# Patient Record
Sex: Female | Born: 1980
Health system: Southern US, Community
[De-identification: ages and names within clinical notes are randomized; demographics above are authoritative.]

## PROBLEM LIST (undated history)

## (undated) ENCOUNTER — Inpatient Hospital Stay (HOSPITAL_COMMUNITY): Payer: Self-pay

## (undated) DIAGNOSIS — Z972 Presence of dental prosthetic device (complete) (partial): Secondary | ICD-10-CM

## (undated) DIAGNOSIS — R7303 Prediabetes: Secondary | ICD-10-CM

## (undated) DIAGNOSIS — G589 Mononeuropathy, unspecified: Secondary | ICD-10-CM

## (undated) DIAGNOSIS — J020 Streptococcal pharyngitis: Secondary | ICD-10-CM

## (undated) DIAGNOSIS — J189 Pneumonia, unspecified organism: Secondary | ICD-10-CM

## (undated) DIAGNOSIS — I10 Essential (primary) hypertension: Secondary | ICD-10-CM

## (undated) HISTORY — PX: NO PAST SURGERIES: SHX2092

## (undated) HISTORY — PX: FRACTURE SURGERY: SHX138

## (undated) HISTORY — PX: WISDOM TOOTH EXTRACTION: SHX21

---
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
## 2003-02-17 ENCOUNTER — Emergency Department (HOSPITAL_COMMUNITY): Admission: EM | Admit: 2003-02-17 | Discharge: 2003-02-17 | Payer: Self-pay | Admitting: Emergency Medicine

## 2004-03-06 ENCOUNTER — Inpatient Hospital Stay (HOSPITAL_COMMUNITY): Admission: AD | Admit: 2004-03-06 | Discharge: 2004-03-06 | Payer: Self-pay | Admitting: *Deleted

## 2004-03-14 ENCOUNTER — Emergency Department (HOSPITAL_COMMUNITY): Admission: EM | Admit: 2004-03-14 | Discharge: 2004-03-14 | Payer: Self-pay | Admitting: Emergency Medicine

## 2004-05-22 ENCOUNTER — Inpatient Hospital Stay (HOSPITAL_COMMUNITY): Admission: AD | Admit: 2004-05-22 | Discharge: 2004-05-22 | Payer: Self-pay | Admitting: *Deleted

## 2004-09-17 ENCOUNTER — Inpatient Hospital Stay (HOSPITAL_COMMUNITY): Admission: AD | Admit: 2004-09-17 | Discharge: 2004-09-19 | Payer: Self-pay | Admitting: Obstetrics

## 2005-05-11 ENCOUNTER — Emergency Department (HOSPITAL_COMMUNITY): Admission: EM | Admit: 2005-05-11 | Discharge: 2005-05-11 | Payer: Self-pay | Admitting: *Deleted

## 2007-07-07 ENCOUNTER — Inpatient Hospital Stay (HOSPITAL_COMMUNITY): Admission: AD | Admit: 2007-07-07 | Discharge: 2007-07-07 | Payer: Self-pay | Admitting: Obstetrics and Gynecology

## 2008-02-28 ENCOUNTER — Emergency Department (HOSPITAL_COMMUNITY): Admission: EM | Admit: 2008-02-28 | Discharge: 2008-02-28 | Payer: Self-pay | Admitting: Family Medicine

## 2008-04-22 ENCOUNTER — Emergency Department (HOSPITAL_COMMUNITY): Admission: EM | Admit: 2008-04-22 | Discharge: 2008-04-22 | Payer: Self-pay | Admitting: Emergency Medicine

## 2008-08-31 ENCOUNTER — Emergency Department (HOSPITAL_COMMUNITY): Admission: EM | Admit: 2008-08-31 | Discharge: 2008-08-31 | Payer: Self-pay | Admitting: Emergency Medicine

## 2008-09-02 ENCOUNTER — Emergency Department (HOSPITAL_COMMUNITY): Admission: EM | Admit: 2008-09-02 | Discharge: 2008-09-02 | Payer: Self-pay | Admitting: Family Medicine

## 2008-10-10 ENCOUNTER — Emergency Department (HOSPITAL_COMMUNITY): Admission: EM | Admit: 2008-10-10 | Discharge: 2008-10-10 | Payer: Self-pay | Admitting: Emergency Medicine

## 2008-10-17 ENCOUNTER — Emergency Department (HOSPITAL_COMMUNITY): Admission: EM | Admit: 2008-10-17 | Discharge: 2008-10-17 | Payer: Self-pay | Admitting: Emergency Medicine

## 2009-06-04 ENCOUNTER — Emergency Department (HOSPITAL_COMMUNITY): Admission: EM | Admit: 2009-06-04 | Discharge: 2009-06-04 | Payer: Self-pay | Admitting: Family Medicine

## 2009-11-14 ENCOUNTER — Emergency Department (HOSPITAL_COMMUNITY): Admission: EM | Admit: 2009-11-14 | Discharge: 2009-11-14 | Payer: Self-pay | Admitting: Emergency Medicine

## 2011-03-05 LAB — DIFFERENTIAL
Basophils Absolute: 0 10*3/uL (ref 0.0–0.1)
Basophils Relative: 0 % (ref 0–1)
Eosinophils Absolute: 0.3 10*3/uL (ref 0.0–0.7)
Eosinophils Relative: 4 % (ref 0–5)
Lymphocytes Relative: 43 % (ref 12–46)
Lymphs Abs: 2.9 10*3/uL (ref 0.7–4.0)
Monocytes Absolute: 0.8 10*3/uL (ref 0.1–1.0)
Monocytes Relative: 11 % (ref 3–12)
Neutro Abs: 2.9 10*3/uL (ref 1.7–7.7)
Neutrophils Relative %: 42 % — ABNORMAL LOW (ref 43–77)

## 2011-03-05 LAB — WET PREP, GENITAL
Clue Cells Wet Prep HPF POC: NONE SEEN
Trich, Wet Prep: NONE SEEN
Yeast Wet Prep HPF POC: NONE SEEN

## 2011-03-05 LAB — COMPREHENSIVE METABOLIC PANEL
ALT: 16 U/L (ref 0–35)
AST: 11 U/L (ref 0–37)
Albumin: 3.9 g/dL (ref 3.5–5.2)
Alkaline Phosphatase: 59 U/L (ref 39–117)
BUN: 9 mg/dL (ref 6–23)
CO2: 26 mEq/L (ref 19–32)
Calcium: 9 mg/dL (ref 8.4–10.5)
Chloride: 106 mEq/L (ref 96–112)
Creatinine, Ser: 0.67 mg/dL (ref 0.4–1.2)
GFR calc Af Amer: >60 mL/min (ref >60)
GFR calc non Af Amer: >60 mL/min (ref >60)
Glucose, Bld: 77 mg/dL (ref 70–99)
Potassium: 3.9 mEq/L (ref 3.5–5.1)
Sodium: 138 mEq/L (ref 135–145)
Total Bilirubin: 0.6 mg/dL (ref 0.3–1.2)
Total Protein: 7.6 g/dL (ref 6.0–8.3)

## 2011-03-05 LAB — CBC
HCT: 40.2 % (ref 36.0–46.0)
Hemoglobin: 13.1 g/dL (ref 12.0–15.0)
MCHC: 32.7 g/dL (ref 30.0–36.0)
MCV: 92.6 fL (ref 78.0–100.0)
RBC: 4.34 MIL/uL (ref 3.87–5.11)
RDW: 14.1 % (ref 11.5–15.5)
WBC: 6.9 10*3/uL (ref 4.0–10.5)

## 2011-03-05 LAB — POCT PREGNANCY, URINE: Preg Test, Ur: NEGATIVE

## 2011-03-05 LAB — URINALYSIS, ROUTINE W REFLEX MICROSCOPIC
Bilirubin Urine: NEGATIVE
Glucose, UA: NEGATIVE mg/dL
Ketones, ur: NEGATIVE mg/dL
Leukocytes, UA: NEGATIVE
Nitrite: NEGATIVE
Protein, ur: NEGATIVE mg/dL
Specific Gravity, Urine: 1.03 (ref 1.005–1.030)
Urobilinogen, UA: 1 mg/dL (ref 0.0–1.0)
pH: 7 (ref 5.0–8.0)

## 2011-03-05 LAB — URINE MICROSCOPIC-ADD ON

## 2011-03-05 LAB — GC/CHLAMYDIA PROBE AMP, GENITAL
Chlamydia, DNA Probe: NEGATIVE
GC Probe Amp, Genital: NEGATIVE

## 2011-03-10 LAB — CULTURE, ROUTINE-ABSCESS

## 2011-09-03 LAB — URINE MICROSCOPIC-ADD ON

## 2011-09-03 LAB — DIFFERENTIAL
Basophils Absolute: 0
Basophils Absolute: 0
Basophils Relative: 0
Basophils Relative: 1
Eosinophils Absolute: 0.3
Eosinophils Absolute: 0.3
Eosinophils Relative: 3
Eosinophils Relative: 6 — ABNORMAL HIGH
Lymphocytes Relative: 17
Lymphocytes Relative: 30
Lymphs Abs: 1.9
Lymphs Abs: 1.8
Monocytes Absolute: 1.5 — ABNORMAL HIGH
Monocytes Absolute: 0.7
Monocytes Relative: 13 — ABNORMAL HIGH
Monocytes Relative: 11
Neutro Abs: 7.7
Neutro Abs: 3.2
Neutrophils Relative %: 67
Neutrophils Relative %: 53

## 2011-09-03 LAB — URINE CULTURE

## 2011-09-03 LAB — LIPASE, BLOOD: Lipase: 15

## 2011-09-03 LAB — COMPREHENSIVE METABOLIC PANEL
ALT: 10
AST: 15
Albumin: 3.6
Alkaline Phosphatase: 51
BUN: 10
CO2: 25
Calcium: 9.1
Chloride: 107
Creatinine, Ser: 0.79
GFR calc Af Amer: >60
GFR calc non Af Amer: >60
Glucose, Bld: 79
Potassium: 4
Sodium: 140
Total Bilirubin: 1.1
Total Protein: 7.4

## 2011-09-03 LAB — URINALYSIS, ROUTINE W REFLEX MICROSCOPIC
Glucose, UA: NEGATIVE
Glucose, UA: NEGATIVE
Ketones, ur: 40 — AB
Ketones, ur: >80 — AB
Leukocytes, UA: NEGATIVE
Nitrite: NEGATIVE
Nitrite: NEGATIVE
Protein, ur: NEGATIVE
Protein, ur: 30 — AB
Specific Gravity, Urine: 1.024
Specific Gravity, Urine: 1.04 — ABNORMAL HIGH
Urobilinogen, UA: 1
Urobilinogen, UA: 1
pH: 6
pH: 6

## 2011-09-03 LAB — CBC
HCT: 44.6
HCT: 42.8
Hemoglobin: 14.5
Hemoglobin: 14.1
MCHC: 32.4
MCHC: 33
MCV: 93.8
MCV: 92.3
Platelets: 252
Platelets: 358
RBC: 4.75
RBC: 4.64
RDW: 13.6
RDW: 14.1
WBC: 11.5 — ABNORMAL HIGH
WBC: 6

## 2011-09-03 LAB — POCT I-STAT, CHEM 8
BUN: 6
Calcium, Ion: 1.13
Chloride: 102
Creatinine, Ser: 1
Glucose, Bld: 88
HCT: 46
Hemoglobin: 15.6 — ABNORMAL HIGH
Potassium: 3.7
Sodium: 139
TCO2: 27

## 2011-09-03 LAB — POCT PREGNANCY, URINE
Preg Test, Ur: NEGATIVE
Preg Test, Ur: NEGATIVE

## 2011-09-16 LAB — WET PREP, GENITAL
Clue Cells Wet Prep HPF POC: NONE SEEN
Trich, Wet Prep: NONE SEEN
Yeast Wet Prep HPF POC: NONE SEEN

## 2011-09-16 LAB — URINE MICROSCOPIC-ADD ON

## 2011-09-16 LAB — URINALYSIS, ROUTINE W REFLEX MICROSCOPIC
Bilirubin Urine: NEGATIVE
Glucose, UA: NEGATIVE
Ketones, ur: 15 — AB
Nitrite: NEGATIVE
Protein, ur: NEGATIVE
Specific Gravity, Urine: >1.03 — ABNORMAL HIGH
Urobilinogen, UA: 0.2
pH: 6

## 2011-09-16 LAB — HERPES SIMPLEX VIRUS CULTURE: Culture: DETECTED

## 2011-09-16 LAB — GC/CHLAMYDIA PROBE AMP, GENITAL
Chlamydia, DNA Probe: NEGATIVE
GC Probe Amp, Genital: NEGATIVE

## 2011-09-16 LAB — POCT PREGNANCY, URINE
Operator id: 114931
Preg Test, Ur: NEGATIVE

## 2011-10-01 ENCOUNTER — Emergency Department (HOSPITAL_COMMUNITY)
Admission: EM | Admit: 2011-10-01 | Discharge: 2011-10-01 | Disposition: A | Discharge status: Home / Self Care | Payer: Self-pay | Attending: Emergency Medicine | Admitting: Emergency Medicine

## 2011-10-01 DIAGNOSIS — M25559 Pain in unspecified hip: Secondary | ICD-10-CM | POA: Insufficient documentation

## 2011-10-01 DIAGNOSIS — N390 Urinary tract infection, site not specified: Secondary | ICD-10-CM | POA: Insufficient documentation

## 2011-10-01 DIAGNOSIS — M5430 Sciatica, unspecified side: Secondary | ICD-10-CM | POA: Insufficient documentation

## 2011-10-01 LAB — URINE MICROSCOPIC-ADD ON

## 2011-10-01 LAB — POCT PREGNANCY, URINE: Preg Test, Ur: NEGATIVE

## 2011-10-01 LAB — URINALYSIS, ROUTINE W REFLEX MICROSCOPIC
Bilirubin Urine: NEGATIVE
Glucose, UA: NEGATIVE mg/dL
Ketones, ur: 15 mg/dL — AB
Leukocytes, UA: NEGATIVE
Nitrite: NEGATIVE
Protein, ur: 30 mg/dL — AB
Specific Gravity, Urine: 1.039 — ABNORMAL HIGH (ref 1.005–1.030)
Urobilinogen, UA: 1 mg/dL (ref 0.0–1.0)
pH: 6 (ref 5.0–8.0)

## 2011-10-07 ENCOUNTER — Emergency Department (HOSPITAL_COMMUNITY)
Admission: EM | Admit: 2011-10-07 | Discharge: 2011-10-07 | Disposition: A | Discharge status: Home / Self Care | Payer: Self-pay | Attending: Emergency Medicine | Admitting: Emergency Medicine

## 2011-10-07 DIAGNOSIS — K0889 Other specified disorders of teeth and supporting structures: Secondary | ICD-10-CM

## 2011-10-07 DIAGNOSIS — K089 Disorder of teeth and supporting structures, unspecified: Secondary | ICD-10-CM | POA: Insufficient documentation

## 2011-10-07 MED ORDER — HYDROCODONE-ACETAMINOPHEN 5-500 MG PO TABS: 2.0000 | ORAL_TABLET | Freq: Four times a day (QID) | ORAL | Status: DC | PRN

## 2011-10-07 MED ORDER — PENICILLIN V POTASSIUM 500 MG PO TABS: 500.0000 mg | ORAL_TABLET | Freq: Three times a day (TID) | ORAL | Status: DC

## 2011-10-07 NOTE — ED Notes (Signed)
Pt complains of a toothache from earlier today, pain got worse after laying down

## 2011-10-07 NOTE — ED Provider Notes (Signed)
History    patient complaining of left upper molar dental pain for the past one day.  Pain is described as throbbing, worsening with hot and cold liquid, and significant enough that she couldn't sleep.  She denies headache, ear pain, sore throat, neck pain, jaw pain, chest pain or shortness of breath. She denies any recent trauma. She has had similar pain to the same tooth in the past which lasted for 2 days. She has not been seen by a dentist for dental pain.  CSN: [id number] Arrival date & time: 10/07/2011  2:21 AM   First MD Initiated Contact with Patient 10/07/11 [phone]      No chief complaint on file.   (Consider location/radiation/quality/duration/timing/severity/associated sxs/prior treatment) Patient is a 30 y.o. female presenting with tooth pain. The history is provided by the patient. No language interpreter was used.  Dental PainThe primary symptoms include mouth pain. The symptoms began 6 to 12 hours ago. The symptoms are worsening. The symptoms occur frequently.  Additional symptoms include: dental sensitivity to temperature.    History reviewed. No pertinent past medical history.  History reviewed. No pertinent past surgical history.  History reviewed. No pertinent family history.  History  Substance Use Topics  . Smoking status: Not on file  . Smokeless tobacco: Not on file  . Alcohol Use: Not on file    OB History    Grav Para Term Preterm Abortions TAB SAB Ect Mult Living                  Review of Systems  All other systems reviewed and are negative.    Allergies  Review of patient's allergies indicates no known allergies.  Home Medications  No current outpatient prescriptions on file.  BP 126/75  Pulse 61  Temp(Src) 98.7 F (37.1 C) (Oral)  Resp 18  SpO2 100%  Physical Exam  Nursing note and vitals reviewed. Constitutional:       Awake, alert, nontoxic appearance  HENT:  Head: Atraumatic.       L upper molar with significant decay.  No  obvious abscess.  Tenderness on percussion.  No maloclusion or TMJ pain.  Normal Ear examinations.    Eyes: Right eye exhibits no discharge. Left eye exhibits no discharge.  Neck: Neck supple.  Pulmonary/Chest: Effort normal. She exhibits no tenderness.  Abdominal: There is no tenderness. There is no rebound.  Musculoskeletal: She exhibits no tenderness.       Baseline ROM, no obvious new focal weakness  Neurological:       Mental status and motor strength appears baseline for patient and situation  Skin: No rash noted.  Psychiatric: She has a normal mood and affect.    ED Course  Procedures (including critical care time)  Labs Reviewed - No data to display No results found.   No diagnosis found.    MDM  Dental pain, secondary to dental decay.  Pain medication and antibiotic prescription given. Will give referral to dentist.        Fayrene Helper, PA Resident 10/07/11 [phone]

## 2011-10-07 NOTE — ED Provider Notes (Signed)
Medical screening examination/treatment/procedure(s) were performed by non-physician practitioner and as supervising physician I was immediately available for consultation/collaboration.   Vida Roller, MD 10/07/11 2017

## 2011-10-09 ENCOUNTER — Emergency Department (HOSPITAL_COMMUNITY)
Admission: EM | Admit: 2011-10-09 | Discharge: 2011-10-09 | Disposition: A | Discharge status: Home / Self Care | Payer: Self-pay | Attending: Emergency Medicine | Admitting: Emergency Medicine

## 2011-10-09 ENCOUNTER — Encounter (HOSPITAL_COMMUNITY): Payer: Self-pay | Admitting: *Deleted

## 2011-10-09 DIAGNOSIS — M549 Dorsalgia, unspecified: Secondary | ICD-10-CM | POA: Insufficient documentation

## 2011-10-09 DIAGNOSIS — M5432 Sciatica, left side: Secondary | ICD-10-CM

## 2011-10-09 DIAGNOSIS — R209 Unspecified disturbances of skin sensation: Secondary | ICD-10-CM | POA: Insufficient documentation

## 2011-10-09 DIAGNOSIS — M5430 Sciatica, unspecified side: Secondary | ICD-10-CM | POA: Insufficient documentation

## 2011-10-09 MED ORDER — METHOCARBAMOL 500 MG PO TABS: 750.0000 mg | ORAL_TABLET | Freq: Two times a day (BID) | ORAL | Status: DC

## 2011-10-09 MED ORDER — PREDNISONE (PAK) 10 MG PO TABS: ORAL_TABLET | ORAL | Status: DC

## 2011-10-09 MED ORDER — KETOROLAC TROMETHAMINE 60 MG/2ML IM SOLN
60.0000 mg | Freq: Once | INTRAMUSCULAR | Status: AC
Start: 2011-10-09 — End: 2011-10-09
  Administered 2011-10-09: 60 mg via INTRAMUSCULAR
  Filled 2011-10-09: qty 2

## 2011-10-09 MED ORDER — HYDROCODONE-ACETAMINOPHEN 5-325 MG PO TABS: 1.0000 | ORAL_TABLET | Freq: Four times a day (QID) | ORAL | Status: DC | PRN

## 2011-10-09 NOTE — ED Provider Notes (Signed)
History     CSN: [id number] Arrival date & time: 10/09/2011  3:21 PM  Patient is a 30 y.o. female presenting with back pain.  Back Pain  This is a new problem. The current episode started more than 1 week ago. The problem occurs constantly. The problem has been gradually worsening. The pain is associated with lifting heavy objects. The pain is present in the lumbar spine. The quality of the pain is described as stabbing and shooting. The pain radiates to the left thigh and left knee. The pain is severe. The symptoms are aggravated by bending, twisting and certain positions. The pain is the same all the time. Associated symptoms include leg pain. Pertinent negatives include no chest pain, no fever, no numbness, no headaches, no abdominal pain, no bowel incontinence, no perianal numbness, no bladder incontinence, no dysuria, no pelvic pain, no paresthesias, no paresis, no tingling and no weakness. She has tried NSAIDs and bed rest for the symptoms. The treatment provided no relief.   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 States she carries a heavy tray constantly and is unsure if this aggravated her back and hip. Reports pain is constant but worse with laying flat and certain positions. Reports she was here at the end of October for same complaints. States pain has been constant and persistent States at that time had a urinalysis completed is negative for urinary tract infection.   History reviewed. No pertinent past medical history.  History reviewed. No pertinent past surgical history.  No family history on file.  History  Substance Use Topics  . Smoking status: Never Smoker   . Smokeless tobacco: Not on file  . Alcohol Use: No    OB History    Grav Para Term Preterm Abortions TAB SAB Ect Mult Living                  Review of Systems  Constitutional: Negative for fever and chills.  HENT: Negative for neck pain and neck stiffness.   Cardiovascular: Negative for chest pain.    Gastrointestinal: Negative for abdominal pain and bowel incontinence.  Genitourinary: Negative for bladder incontinence, dysuria, urgency, frequency, hematuria, flank pain and pelvic pain.  Musculoskeletal: Positive for back pain.       Denies saddle anesthesias, or perineal numbness, urinary or bowel incontinence  Neurological: Negative for tingling, weakness, numbness, headaches and paresthesias.    Allergies  Review of patient's allergies indicates no known allergies.  Home Medications   Current Outpatient Rx  Name Route Sig Dispense Refill  . PENICILLIN V POTASSIUM 500 MG PO TABS Oral Take 500 mg by mouth 3 (three) times daily.        BP 144/86  Pulse 89  Temp(Src) 98.1 F (36.7 C) (Oral)  Resp 16  Wt 157 lb 13.6 oz (71.6 kg)  SpO2 100%  LMP 09/13/2011  Physical Exam  Vitals reviewed. Constitutional: She is oriented to person, place, and time. Vital signs are normal. She appears well-developed and well-nourished.  HENT:  Head: Normocephalic and atraumatic.  Eyes: Conjunctivae are normal. Pupils are equal, round, and reactive to light.  Neck: Normal range of motion. Neck supple.  Cardiovascular: Normal rate, regular rhythm and normal heart sounds.  Exam reveals no friction rub.   No murmur heard. Pulmonary/Chest: Effort normal and breath sounds normal. She has no wheezes. She has no rhonchi. She has no rales. She exhibits no tenderness.  Musculoskeletal: Normal range of motion.       Lumbar back:  She exhibits tenderness, pain and spasm. She exhibits normal range of motion, no bony tenderness, no swelling, no edema and no deformity.       Back:       Reproduction of pain with percussion over left lumbar paraspinal region . Positive SLR. NML sensation of BLE. No perineal numbness, full ROM of low back and bilateral hips. NML strength. NML gait  Neurological: She is alert and oriented to person, place, and time. Coordination normal.  Skin: Skin is warm and dry. No rash  noted. No erythema. No pallor.    ED Course  Procedures (including critical care time)  Labs Reviewed - No data to display No results found.   No diagnosis found.    MDM         Thomasene Lot, PA 10/09/11 1756  Thomasene Lot, PA 10/09/11 1757

## 2011-10-09 NOTE — ED Notes (Signed)
Pt states "I was here x 2 wks ago for pain in my left hip, it went away but now it worse than before"

## 2011-10-09 NOTE — ED Notes (Signed)
Cont. To have pain

## 2011-10-10 ENCOUNTER — Encounter (HOSPITAL_COMMUNITY): Payer: Self-pay | Admitting: Emergency Medicine

## 2011-10-10 ENCOUNTER — Emergency Department (HOSPITAL_COMMUNITY)
Admission: EM | Admit: 2011-10-10 | Discharge: 2011-10-10 | Disposition: A | Discharge status: Home / Self Care | Payer: Self-pay | Attending: Emergency Medicine | Admitting: Emergency Medicine

## 2011-10-10 ENCOUNTER — Emergency Department (HOSPITAL_COMMUNITY): Payer: Self-pay

## 2011-10-10 DIAGNOSIS — IMO0001 Reserved for inherently not codable concepts without codable children: Secondary | ICD-10-CM | POA: Insufficient documentation

## 2011-10-10 DIAGNOSIS — Z79899 Other long term (current) drug therapy: Secondary | ICD-10-CM | POA: Insufficient documentation

## 2011-10-10 DIAGNOSIS — M25552 Pain in left hip: Secondary | ICD-10-CM

## 2011-10-10 DIAGNOSIS — M545 Low back pain, unspecified: Secondary | ICD-10-CM | POA: Insufficient documentation

## 2011-10-10 DIAGNOSIS — M25559 Pain in unspecified hip: Secondary | ICD-10-CM | POA: Insufficient documentation

## 2011-10-10 MED ORDER — OXYCODONE-ACETAMINOPHEN 5-325 MG PO TABS
1.0000 | ORAL_TABLET | Freq: Once | ORAL | Status: AC
Start: 2011-10-10 — End: 2011-10-10
  Administered 2011-10-10: 1 via ORAL
  Filled 2011-10-10: qty 1

## 2011-10-10 MED ORDER — HYDROMORPHONE HCL PF 1 MG/ML IJ SOLN
1.0000 mg | Freq: Once | INTRAMUSCULAR | Status: AC
  Administered 2011-10-10: 1 mg via INTRAMUSCULAR
  Filled 2011-10-10: qty 1

## 2011-10-10 MED ORDER — OXYCODONE-ACETAMINOPHEN 5-325 MG PO TABS: 1.0000 | ORAL_TABLET | ORAL | Status: DC | PRN

## 2011-10-10 NOTE — Discharge Instructions (Signed)
Continue prednisone as prescribed.  Take percocet as needed for severe pain.  Do not drive within four hours of taking this medication (may cause drowsiness or confusion).  Activity as tolerated.  Follow up with the orthopedic physician.   You may return to the ER if symptoms worsen or you have any other concerns.

## 2011-10-10 NOTE — ED Notes (Signed)
leftr hip pain x 2 days   Was seen at [facility] yesterday and dx w/ pinched nerve was given rx got them filled but not helping

## 2011-10-10 NOTE — ED Notes (Signed)
Pt with continued pain. Injection given as ordered. Will reeval prior to d/c

## 2011-10-10 NOTE — ED Provider Notes (Signed)
Medical screening examination/treatment/procedure(s) were performed by non-physician practitioner and as supervising physician I was immediately available for consultation/collaboration.   Laray Anger, DO 10/10/11 [phone]

## 2011-10-10 NOTE — ED Provider Notes (Signed)
History     CSN: [id number] Arrival date & time: 10/10/2011  9:00 AM   First MD Initiated Contact with Patient 10/10/11 0913      Chief Complaint  Patient presents with  . Hip Pain    (Consider location/radiation/quality/duration/timing/severity/associated sxs/prior treatment) HPI History provided by pt.   Pt has had intermittent pain L hip for the past two weeks.  Has recently become more constant and severe.  Denies trauma.  Has mild low back pain.  Denies fever, abd pain bladder/bowel dysfunction and lower extremity weakness/parasthesias.   Ambulatory but bearing weight aggravates pain.  Denies trauma.  Has never had pain like this in the past.  Per prior chart, pt seen for same in ED yesterday.  Diagnosed w/ L sciatica and discharged home w/ vicodin, robaxin and prednisone.  No relief w/ these medications.  Patient's sister crying because she has never seen pt in so much pain and it scares her.  History reviewed. No pertinent past medical history.  No past surgical history on file.  No family history on file.  History  Substance Use Topics  . Smoking status: Never Smoker   . Smokeless tobacco: Not on file  . Alcohol Use: No    OB History    Grav Para Term Preterm Abortions TAB SAB Ect Mult Living                  Review of Systems  All other systems reviewed and are negative.    Allergies  Review of patient's allergies indicates no known allergies.  Home Medications   Current Outpatient Rx  Name Route Sig Dispense Refill  . HYDROCODONE-ACETAMINOPHEN 5-325 MG PO TABS Oral Take 1-2 tablets by mouth every 6 (six) hours as needed for pain. 15 tablet 0  . METHOCARBAMOL 500 MG PO TABS Oral Take 1.5 tablets (750 mg total) by mouth 2 (two) times daily. 20 tablet 0  . PENICILLIN V POTASSIUM 500 MG PO TABS Oral Take 500 mg by mouth 3 (three) times daily.      Marland Kitchen PREDNISONE (PAK) 10 MG PO TABS  Take 6 Tabs PO on day 1. Take 5 tabs PO on day 2. Take 4 tabs PO on day  3. Take 3 tabs PO on day 4. Take 2 tabs PO on day 5. Take 1 tab PO on day 6.  21 tablet 0    BP 124/77  Pulse 83  Temp(Src) 97.7 F (36.5 C) (Oral)  Resp 16  Ht 5\' 6"  (1.676 m)  Wt 165 lb (74.844 kg)  BMI 26.63 kg/m2  SpO2 99%  LMP 09/13/2011  Physical Exam  Nursing note and vitals reviewed. Constitutional: She is oriented to person, place, and time. She appears well-developed and well-nourished.  HENT:  Head: Normocephalic and atraumatic.  Eyes:       Normal appearance  Neck: Normal range of motion.  Cardiovascular: Normal rate and regular rhythm.   Pulmonary/Chest: Effort normal and breath sounds normal.  Musculoskeletal:       Mild, mid-line lumbar spine ttp. Tenderness left buttock and along iliac crest anteriorly and laterally.  No CVA ttp. Full ROM of LE but pain w/ internal rotation of hip.  Nml patellar reflexes.  No saddle anesthesia. Distal sensation intact.  2+ DP pulses.    Neurological: She is alert and oriented to person, place, and time.  Skin: Skin is warm and dry. No rash noted.  Psychiatric: She has a normal mood and affect. Her behavior is normal.  ED Course  Procedures (including critical care time)  Labs Reviewed - No data to display Dg Hip Complete Left  10/10/2011  *RADIOLOGY REPORT*  Clinical Data: Pain in the lateral and posterior left hip.  LEFT HIP - COMPLETE 2+ VIEW  Comparison: None.  Findings: Single view of the pelvis and two views of the left hip were obtained.  The pelvic bony ring is intact.  Normal appearance of the SI joints and sacrum.  High dense material in the pelvis probably related to stool.  Pubic rami are intact.  The left hip is located without acute fracture.  IMPRESSION: No acute bony abnormality to the pelvis or left hip.  Original Report Authenticated By: Richarda Overlie, M.D.     1. Pain in left hip       MDM  Pt presents for second day in a row w/ left hip pain.  Diagnosed w/ sciatica yesterday and prescribed medications  have not given her any relief.  Sister crying because she has never seen pt in so much pain.  On exam, tenderness lumbar spine, left buttock and left iliac crest.  Worst at ant/lat iliac crest.  No red flag s/sx.   Xray pending.  Pt ordered 1 percocet for pain.    Xray neg. Results discussed w/ pt.  She reports that pain has not improved w/ percocet.  Will give her a single dose of IM dilaudid and d/c home. Prescribed short course of percocet and recommended rest and ortho f/u.  She will return if symptoms worsen or she develops fever.       Otilio Miu, [place] 10/10/11 1106

## 2011-10-10 NOTE — ED Provider Notes (Signed)
Medical screening examination/treatment/procedure(s) were performed by non-physician practitioner and as supervising physician I was immediately available for consultation/collaboration.  Aloysius Heinle, MD 10/10/11 1724 

## 2011-10-18 ENCOUNTER — Emergency Department (HOSPITAL_COMMUNITY)
Admission: EM | Admit: 2011-10-18 | Discharge: 2011-10-18 | Disposition: A | Discharge status: Home / Self Care | Payer: Self-pay | Source: Home / Self Care

## 2011-10-18 ENCOUNTER — Encounter (HOSPITAL_COMMUNITY): Payer: Self-pay | Admitting: *Deleted

## 2011-10-18 DIAGNOSIS — N760 Acute vaginitis: Secondary | ICD-10-CM

## 2011-10-18 HISTORY — DX: Mononeuropathy, unspecified: G58.9

## 2011-10-18 LAB — POCT URINALYSIS DIP (DEVICE)
Glucose, UA: NEGATIVE mg/dL
Ketones, ur: 40 mg/dL — AB
Leukocytes, UA: NEGATIVE
Nitrite: NEGATIVE
Protein, ur: 30 mg/dL — AB
Specific Gravity, Urine: 1.02 (ref 1.005–1.030)
Urobilinogen, UA: 2 mg/dL — ABNORMAL HIGH (ref 0.0–1.0)
pH: 7 (ref 5.0–8.0)

## 2011-10-18 LAB — POCT PREGNANCY, URINE: Preg Test, Ur: NEGATIVE

## 2011-10-18 LAB — WET PREP, GENITAL
Trich, Wet Prep: NONE SEEN
Yeast Wet Prep HPF POC: NONE SEEN

## 2011-10-18 MED ORDER — METRONIDAZOLE 500 MG PO TABS: 500.0000 mg | ORAL_TABLET | Freq: Two times a day (BID) | ORAL | Status: AC

## 2011-10-18 NOTE — ED Provider Notes (Signed)
History     CSN: [id number] Arrival date & time: 10/18/2011  7:35 PM   None     Chief Complaint  Patient presents with  . Urinary Tract Infection    (Consider location/radiation/quality/duration/timing/severity/associated sxs/prior treatment) HPI Comments: Pt presents concerned she has a UTI. She states that she has pressure with every void. No burning or pain, or frequency. She also has Lt lower back pain every time she urinates - achey pain. Was recently seen in ED with Lt lower back back but states this is different. Lower abd aching. No vaginal discharge.   Patient is a 30 y.o. female presenting with urinary tract infection. The history is provided by the patient.  Urinary Tract Infection This is a new problem. The current episode started more than 2 days ago. The problem occurs constantly. The problem has not changed since onset.Associated symptoms include abdominal pain. Pertinent negatives include no chest pain, no headaches and no shortness of breath. The symptoms are aggravated by nothing. The symptoms are relieved by nothing. She has tried nothing for the symptoms.    Past Medical History  Diagnosis Date  . Pinched nerve     in back; recently completed course of meds for sxs    History reviewed. No pertinent past surgical history.  History reviewed. No pertinent family history.  History  Substance Use Topics  . Smoking status: Never Smoker   . Smokeless tobacco: Not on file  . Alcohol Use: No    OB History    Grav Para Term Preterm Abortions TAB SAB Ect Mult Living                  Review of Systems  Constitutional: Negative for fever and chills.  Respiratory: Negative for shortness of breath.   Cardiovascular: Negative for chest pain.  Gastrointestinal: Positive for abdominal pain. Negative for nausea, vomiting, diarrhea and constipation.  Genitourinary: Negative for dysuria, urgency, frequency, vaginal bleeding, vaginal discharge, vaginal pain and pelvic  pain.  Musculoskeletal: Positive for back pain.  Neurological: Negative for headaches.    Allergies  Review of patient's allergies indicates no known allergies.  Home Medications  No current outpatient prescriptions on file.  BP 127/82  Pulse 73  Temp(Src) 97.5 F (36.4 C) (Oral)  Resp 16  SpO2 100%  LMP 10/12/2011  Physical Exam  Nursing note and vitals reviewed. Constitutional: She appears well-developed and well-nourished. No distress.  HENT:  Head: Normocephalic and atraumatic.  Cardiovascular: Normal rate and regular rhythm.   Murmur heard. Pulmonary/Chest: Effort normal and breath sounds normal. No respiratory distress.  Abdominal: Soft. Normal appearance and bowel sounds are normal. She exhibits no mass. There is no hepatosplenomegaly. There is tenderness (mild) in the suprapubic area. There is no rebound, no guarding and no CVA tenderness.  Genitourinary: Uterus normal. There is no rash, tenderness or lesion on the right labia. There is no rash, tenderness or lesion on the left labia. Cervix exhibits no motion tenderness, no discharge and no friability. Right adnexum displays no mass, no tenderness and no fullness. Left adnexum displays no mass, no tenderness and no fullness. No erythema, tenderness or bleeding around the vagina. Vaginal discharge (moderate amount of white thin discharge) found.  Musculoskeletal: Normal range of motion. She exhibits no edema and no tenderness.       Lumbar back: She exhibits normal range of motion, no tenderness, no edema, no deformity and no spasm.  Neurological: She is alert.  Skin: Skin is warm and dry.  Psychiatric: She has a normal mood and affect.    ED Course  Procedures (including critical care time)  Labs Reviewed  POCT URINALYSIS DIP (DEVICE) - Abnormal; Notable for the following:    Bilirubin Urine SMALL (*)    Ketones, ur 40 (*)    Hgb urine dipstick TRACE (*)    Protein, ur 30 (*)    Urobilinogen, UA 2.0 (*)    All  other components within normal limits  POCT PREGNANCY, URINE  POCT URINALYSIS DIPSTICK  POCT PREGNANCY, URINE  GC/CHLAMYDIA PROBE AMP, GENITAL  WET PREP, GENITAL   No results found.   No diagnosis found.    MDM  Urine neg. GC/Chlamydia & wet prep pending.         Melody Comas, [place] 10/18/11 2026

## 2011-10-18 NOTE — ED Notes (Signed)
C/O dysuria, left low back and side pain, urinary urgency x 3 days without fever or abd pain.  Has not taken any measures to help alleviate sxs.

## 2011-10-19 LAB — GC/CHLAMYDIA PROBE AMP, GENITAL
Chlamydia, DNA Probe: NEGATIVE
GC Probe Amp, Genital: NEGATIVE

## 2011-10-21 NOTE — ED Notes (Signed)
Labs and medications reviewed. Pt. pos. for bacterial vaginosis and adequately treated with Flagyl.  bacterial vaginosis Victoria Harvey 10/21/2011

## 2011-10-21 NOTE — ED Provider Notes (Signed)
Medical screening examination/treatment/procedure(s) were performed by non-physician practitioner and as supervising physician I was immediately available for consultation/collaboration.  LANEY,RONNIE  Ronnie Laney, MD 10/21/11 1641 

## 2012-10-08 ENCOUNTER — Encounter (HOSPITAL_COMMUNITY): Payer: Self-pay | Admitting: Emergency Medicine

## 2012-10-08 ENCOUNTER — Emergency Department (HOSPITAL_COMMUNITY)
Admission: EM | Admit: 2012-10-08 | Discharge: 2012-10-08 | Disposition: A | Discharge status: Home / Self Care | Payer: Self-pay | Attending: Emergency Medicine | Admitting: Emergency Medicine

## 2012-10-08 DIAGNOSIS — Z8669 Personal history of other diseases of the nervous system and sense organs: Secondary | ICD-10-CM | POA: Insufficient documentation

## 2012-10-08 DIAGNOSIS — K0889 Other specified disorders of teeth and supporting structures: Secondary | ICD-10-CM

## 2012-10-08 DIAGNOSIS — K089 Disorder of teeth and supporting structures, unspecified: Secondary | ICD-10-CM | POA: Insufficient documentation

## 2012-10-08 MED ORDER — IBUPROFEN 800 MG PO TABS: 800.0000 mg | ORAL_TABLET | Freq: Three times a day (TID) | ORAL | Status: DC

## 2012-10-08 MED ORDER — HYDROCODONE-ACETAMINOPHEN 5-325 MG PO TABS: 1.0000 | ORAL_TABLET | ORAL | Status: DC | PRN

## 2012-10-08 MED ORDER — PENICILLIN V POTASSIUM 500 MG PO TABS: 500.0000 mg | ORAL_TABLET | Freq: Three times a day (TID) | ORAL | Status: DC

## 2012-10-08 NOTE — ED Notes (Signed)
Patient complaining of left upper dental pain that started last night; patient reports cracked/broken tooth.  Reports that it has been years since she has been to the dentist due to no insurance.

## 2012-10-08 NOTE — ED Provider Notes (Signed)
History     CSN: [id number]  Arrival date & time 10/08/12  7829   First MD Initiated Contact with Patient 10/08/12 2046      Chief Complaint  Patient presents with  . Dental Pain    (Consider location/radiation/quality/duration/timing/severity/associated sxs/prior treatment) HPI History provided by pt.   Pt presents w/ left upper toothache since last night.  Pain severe and radiates up left side of face.  No relief w/ tylenol.  No associated fever.    Past Medical History  Diagnosis Date  . Pinched nerve     in back; recently completed course of meds for sxs    History reviewed. No pertinent past surgical history.  History reviewed. No pertinent family history.  History  Substance Use Topics  . Smoking status: Never Smoker   . Smokeless tobacco: Not on file  . Alcohol Use: No    OB History    Grav Para Term Preterm Abortions TAB SAB Ect Mult Living                  Review of Systems  All other systems reviewed and are negative.    Allergies  Review of patient's allergies indicates no known allergies.  Home Medications   Current Outpatient Rx  Name  Route  Sig  Dispense  Refill  . ACETAMINOPHEN 500 MG PO TABS   Oral   Take 1,000 mg by mouth every 6 (six) hours as needed. For pain.         Marland Kitchen HYDROCODONE-ACETAMINOPHEN 5-325 MG PO TABS   Oral   Take 1 tablet by mouth every 4 (four) hours as needed for pain.   20 tablet   0   . IBUPROFEN 800 MG PO TABS   Oral   Take 1 tablet (800 mg total) by mouth 3 (three) times daily.   12 tablet   0   . PENICILLIN V POTASSIUM 500 MG PO TABS   Oral   Take 1 tablet (500 mg total) by mouth 3 (three) times daily.   30 tablet   0     BP 143/81  Pulse 82  Temp 98.6 F (37 C) (Oral)  Resp 18  SpO2 99%  LMP 09/25/2012  Physical Exam  Nursing note and vitals reviewed. Constitutional: She is oriented to person, place, and time. She appears well-developed and well-nourished.  HENT:  Head: Normocephalic  and atraumatic. No trismus in the jaw.  Mouth/Throat: Uvula is midline and oropharynx is clear and moist.       Poor dentition.  Only a small fragment of left upper third molar is visible.  Adjacent gingiva mildly edematous and erythematous.  Severely ttp.  No edema of buccal mucosa.    Eyes:       Normal appearance  Neck: Normal range of motion. Neck supple.       No submandibular edema  Lymphadenopathy:    She has no cervical adenopathy.  Neurological: She is alert and oriented to person, place, and time.  Psychiatric: She has a normal mood and affect. Her behavior is normal.    ED Course  Procedures (including critical care time)  Labs Reviewed - No data to display No results found.   1. Toothache       MDM  Pt presents w/ left upper third molar pain.  Pt prescribed penicillin for possible periapical infection as well as vicodin and 800mg  ibuprofen.  Referred to dentist on call.  Return precautions discussed.  Arie Sabina Killdeer, [place] 10/08/12 2124

## 2012-10-08 NOTE — ED Notes (Signed)
Pt c/o left upper dental pain since last night. Pt states tooth is broken and she can't see dentist. No swelling noted to area.

## 2012-10-09 NOTE — ED Provider Notes (Signed)
Medical screening examination/treatment/procedure(s) were performed by non-physician practitioner and as supervising physician I was immediately available for consultation/collaboration.    Mileydi Milsap R Kirstan Fentress, MD 10/09/12 0012 

## 2012-12-24 ENCOUNTER — Emergency Department (HOSPITAL_COMMUNITY)
Admission: EM | Admit: 2012-12-24 | Discharge: 2012-12-24 | Disposition: A | Discharge status: Home / Self Care | Payer: Self-pay | Attending: Emergency Medicine | Admitting: Emergency Medicine

## 2012-12-24 ENCOUNTER — Encounter (HOSPITAL_COMMUNITY): Payer: Self-pay | Admitting: *Deleted

## 2012-12-24 DIAGNOSIS — Z8669 Personal history of other diseases of the nervous system and sense organs: Secondary | ICD-10-CM | POA: Insufficient documentation

## 2012-12-24 DIAGNOSIS — K089 Disorder of teeth and supporting structures, unspecified: Secondary | ICD-10-CM | POA: Insufficient documentation

## 2012-12-24 DIAGNOSIS — K0889 Other specified disorders of teeth and supporting structures: Secondary | ICD-10-CM

## 2012-12-24 MED ORDER — HYDROCODONE-ACETAMINOPHEN 5-325 MG PO TABS: 2.0000 | ORAL_TABLET | Freq: Four times a day (QID) | ORAL | Status: DC | PRN

## 2012-12-24 MED ORDER — PENICILLIN V POTASSIUM 500 MG PO TABS: 500.0000 mg | ORAL_TABLET | Freq: Three times a day (TID) | ORAL | Status: DC

## 2012-12-24 NOTE — ED Provider Notes (Signed)
History     CSN: [id number]  Arrival date & time 12/24/12  1445   First MD Initiated Contact with Patient 12/24/12 1709      Chief Complaint  Patient presents with  . Dental Pain    (Consider location/radiation/quality/duration/timing/severity/associated sxs/prior treatment) HPI Comments: This is a 32 year old female, who presents emergency department with chief complaint of dental pain. Patient states that her symptoms began yesterday, and she has been taking ibuprofen with no relief. Patient states that the pain is 10 out of 10. She states that nothing makes her symptoms better or worse. She states that she has not been eating anything today due to the pain. She does not have a dentist. She denies nausea, vomiting, and fever.  The history is provided by the patient. No language interpreter was used.    Past Medical History  Diagnosis Date  . Pinched nerve     in back; recently completed course of meds for sxs    History reviewed. No pertinent past surgical history.  No family history on file.  History  Substance Use Topics  . Smoking status: Never Smoker   . Smokeless tobacco: Not on file  . Alcohol Use: No    OB History    Grav Para Term Preterm Abortions TAB SAB Ect Mult Living                  Review of Systems  All other systems reviewed and are negative.    Allergies  Review of patient's allergies indicates no known allergies.  Home Medications   Current Outpatient Rx  Name  Route  Sig  Dispense  Refill  . IBUPROFEN 800 MG PO TABS   Oral   Take 1 tablet (800 mg total) by mouth 3 (three) times daily.   12 tablet   0   . HYDROCODONE-ACETAMINOPHEN 5-325 MG PO TABS   Oral   Take 2 tablets by mouth every 6 (six) hours as needed for pain.   12 tablet   0   . PENICILLIN V POTASSIUM 500 MG PO TABS   Oral   Take 1 tablet (500 mg total) by mouth 3 (three) times daily.   30 tablet   0     BP 161/98  Pulse 80  Temp 99.4 F (37.4 C) (Oral)   Resp 18  SpO2 100%  LMP 12/15/2012  Physical Exam  Nursing note and vitals reviewed. Constitutional: She is oriented to person, place, and time. She appears well-developed and well-nourished.  HENT:  Head: Normocephalic and atraumatic.  Mouth/Throat:         Left upper molar with dental caries, and mild swelling of the gingiva, no tonsillar or peritonsillar abscesses, uvula is midline, no signs of Ludwig angina.  Eyes: Conjunctivae normal and EOM are normal.  Neck: Normal range of motion.  Cardiovascular: Normal rate.   Pulmonary/Chest: Effort normal.  Abdominal: She exhibits no distension.  Musculoskeletal: Normal range of motion.  Neurological: She is alert and oriented to person, place, and time.  Skin: Skin is dry.  Psychiatric: She has a normal mood and affect. Her behavior is normal. Judgment and thought content normal.    ED Course  Procedures (including critical care time)  Labs Reviewed - No data to display No results found.  Dental block performed, without complication, alveolar block was used, with bupivacaine 3 ml.  1. Pain, dental       MDM  A 32 year old female with dental pain. Believe this to be  uncomplicated dental pain. I gave the patient a dental block, and will give the patient pain medicine and penicillin to go home with. Patient will be given followup with the dentist.        Roxy Horseman, PA-C 12/24/12 1844

## 2012-12-24 NOTE — ED Notes (Signed)
Pt c/o L upper molar dental pain since last night.  No relief with ibuprofen.  No facial swelling noted.  Temp of 99.4.

## 2012-12-24 NOTE — ED Provider Notes (Signed)
Medical screening examination/treatment/procedure(s) were performed by non-physician practitioner and as supervising physician I was immediately available for consultation/collaboration.   Lariza Cothron H Shevy Yaney, MD 12/24/12 2337 

## 2013-05-23 ENCOUNTER — Encounter (HOSPITAL_COMMUNITY): Payer: Self-pay | Admitting: Emergency Medicine

## 2013-05-23 ENCOUNTER — Emergency Department (HOSPITAL_COMMUNITY)
Admission: EM | Admit: 2013-05-23 | Discharge: 2013-05-23 | Disposition: A | Discharge status: Home / Self Care | Payer: Self-pay | Attending: Emergency Medicine | Admitting: Emergency Medicine

## 2013-05-23 DIAGNOSIS — H00029 Hordeolum internum unspecified eye, unspecified eyelid: Secondary | ICD-10-CM | POA: Insufficient documentation

## 2013-05-23 DIAGNOSIS — H00022 Hordeolum internum right lower eyelid: Secondary | ICD-10-CM

## 2013-05-23 DIAGNOSIS — Z8669 Personal history of other diseases of the nervous system and sense organs: Secondary | ICD-10-CM | POA: Insufficient documentation

## 2013-05-23 MED ORDER — FLUORESCEIN SODIUM 1 MG OP STRP
1.0000 | ORAL_STRIP | Freq: Once | OPHTHALMIC | Status: AC
  Administered 2013-05-23: 10:00:00 via OPHTHALMIC
  Filled 2013-05-23: qty 1

## 2013-05-23 MED ORDER — POLYMYXIN B-TRIMETHOPRIM 10000-0.1 UNIT/ML-% OP SOLN: 1.0000 [drp] | OPHTHALMIC | Status: DC

## 2013-05-23 MED ORDER — TETRACAINE HCL 0.5 % OP SOLN
1.0000 [drp] | Freq: Once | OPHTHALMIC | Status: AC
  Administered 2013-05-23: 1 [drp] via OPHTHALMIC
  Filled 2013-05-23: qty 2

## 2013-05-23 NOTE — ED Notes (Signed)
Pt normally wears contacts-this visual acuity is uncorrected

## 2013-05-23 NOTE — ED Notes (Addendum)
Pt visual acuity was uncorrected. Pt normally wears contacts but was unable to wear d/t swelling and irritation. R uncorrected 20/50

## 2013-05-23 NOTE — ED Provider Notes (Signed)
History     CSN: [id number]  Arrival date & time 05/23/13  9147   First MD Initiated Contact with Patient 05/23/13 1005      Chief Complaint  Patient presents with  . Eye Drainage    (Consider location/radiation/quality/duration/timing/severity/associated sxs/prior treatment) HPI Comments: Patient is a 32 year old female who presents with right eye pain that started this morning. Symptoms started gradually and progressively worsened since the onset. The pain is dull and moderate without radiation. Patient reports associated periorbital swelling, crusting along eyelashes and watery discharge. Patient does normally wear contacts but has not worn them recently due to allergies. No other associated symptoms. Palpation of the lower lid makes the pain worse. No alleviating factors.    Past Medical History  Diagnosis Date  . Pinched nerve     in back; recently completed course of meds for sxs    History reviewed. No pertinent past surgical history.  No family history on file.  History  Substance Use Topics  . Smoking status: Never Smoker   . Smokeless tobacco: Not on file  . Alcohol Use: No    OB History   Grav Para Term Preterm Abortions TAB SAB Ect Mult Living                  Review of Systems  Eyes: Positive for pain and discharge.  All other systems reviewed and are negative.    Allergies  Review of patient's allergies indicates no known allergies.  Home Medications   Current Outpatient Rx  Name  Route  Sig  Dispense  Refill  . acetaminophen (TYLENOL) 500 MG tablet   Oral   Take 1,000 mg by mouth every 6 (six) hours as needed for pain.           BP 145/82  Pulse 88  Temp(Src) 98.7 F (37.1 C) (Oral)  Resp 20  SpO2 100%  LMP 05/08/2013  Physical Exam  Nursing note and vitals reviewed. Constitutional: She appears well-developed and well-nourished. No distress.  HENT:  Head: Normocephalic and atraumatic.  Eyes: Conjunctivae and EOM are normal.  Pupils are equal, round, and reactive to light. No scleral icterus.  Right lower lid periorbital edema and tenderness to palpation. No conjunctival injection noted. EOM intact.   Neck: Normal range of motion.  Cardiovascular: Normal rate and regular rhythm.  Exam reveals no gallop and no friction rub.   No murmur heard. Pulmonary/Chest: Effort normal and breath sounds normal. She has no wheezes. She has no rales. She exhibits no tenderness.  Abdominal: Soft. There is no tenderness.  Musculoskeletal: Normal range of motion.  Neurological: She is alert. Coordination normal.  Speech is goal-oriented. Moves limbs without ataxia.   Skin: Skin is warm and dry.  Psychiatric: She has a normal mood and affect. Her behavior is normal.    ED Course  Procedures (including critical care time)  Labs Reviewed - No data to display No results found.   1. Hordeolum internum of right lower eyelid       MDM  10:16 AM Patient will have visual acuity test and I will do a Wood's lamp exam with Fluorescin dye. Vitals stable and patient afebrile.   10:45 AM Wood's lamp exam unremarkable for corneal abrasion or foreign body. Patient likely has hordeolum of lower right lid.      Emilia Beck, PA-C 05/23/13 [address] PA-C 05/23/13 1045

## 2013-05-23 NOTE — ED Provider Notes (Signed)
Medical screening examination/treatment/procedure(s) were performed by non-physician practitioner and as supervising physician I was immediately available for consultation/collaboration.   Richardean Canal, MD 05/23/13 1049

## 2013-05-23 NOTE — ED Notes (Signed)
Pt reports waking up with R eye swelling, crustiness along eyelashes, redness and watering. Pt R eye appears swollen. Pt has minor swelling to bottom eyelid. No redness, crust or watering noted. Pt denies itching, burning, but states that vision is a little blurry at times. Pt A&O and in NAD

## 2013-06-01 ENCOUNTER — Encounter (HOSPITAL_COMMUNITY): Payer: Self-pay | Admitting: Emergency Medicine

## 2013-06-01 ENCOUNTER — Emergency Department (HOSPITAL_COMMUNITY)
Admission: EM | Admit: 2013-06-01 | Discharge: 2013-06-01 | Disposition: A | Discharge status: Home / Self Care | Payer: Self-pay | Attending: Emergency Medicine | Admitting: Emergency Medicine

## 2013-06-01 ENCOUNTER — Emergency Department (HOSPITAL_COMMUNITY): Payer: Self-pay

## 2013-06-01 DIAGNOSIS — J189 Pneumonia, unspecified organism: Secondary | ICD-10-CM | POA: Insufficient documentation

## 2013-06-01 DIAGNOSIS — R071 Chest pain on breathing: Secondary | ICD-10-CM | POA: Insufficient documentation

## 2013-06-01 DIAGNOSIS — R0602 Shortness of breath: Secondary | ICD-10-CM | POA: Insufficient documentation

## 2013-06-01 DIAGNOSIS — J159 Unspecified bacterial pneumonia: Secondary | ICD-10-CM | POA: Insufficient documentation

## 2013-06-01 DIAGNOSIS — Z8669 Personal history of other diseases of the nervous system and sense organs: Secondary | ICD-10-CM | POA: Insufficient documentation

## 2013-06-01 LAB — CBC WITH DIFFERENTIAL/PLATELET
Basophils Absolute: 0 10*3/uL (ref 0.0–0.1)
Basophils Relative: 0 % (ref 0–1)
Eosinophils Absolute: 1.2 10*3/uL — ABNORMAL HIGH (ref 0.0–0.7)
Eosinophils Relative: 12 % — ABNORMAL HIGH (ref 0–5)
HCT: 38.3 % (ref 36.0–46.0)
Hemoglobin: 12.9 g/dL (ref 12.0–15.0)
Lymphocytes Relative: 22 % (ref 12–46)
Lymphs Abs: 2.2 10*3/uL (ref 0.7–4.0)
MCH: 29.7 pg (ref 26.0–34.0)
MCHC: 33.7 g/dL (ref 30.0–36.0)
MCV: 88.2 fL (ref 78.0–100.0)
Monocytes Absolute: 1.2 10*3/uL — ABNORMAL HIGH (ref 0.1–1.0)
Monocytes Relative: 13 % — ABNORMAL HIGH (ref 3–12)
Neutro Abs: 5.2 10*3/uL (ref 1.7–7.7)
Neutrophils Relative %: 53 % (ref 43–77)
Platelets: 320 10*3/uL (ref 150–400)
RBC: 4.34 MIL/uL (ref 3.87–5.11)
RDW: 14.1 % (ref 11.5–15.5)
WBC: 9.7 10*3/uL (ref 4.0–10.5)

## 2013-06-01 LAB — BASIC METABOLIC PANEL
BUN: 13 mg/dL (ref 6–23)
CO2: 27 mEq/L (ref 19–32)
Calcium: 9.3 mg/dL (ref 8.4–10.5)
Chloride: 102 mEq/L (ref 96–112)
Creatinine, Ser: 0.8 mg/dL (ref 0.50–1.10)
GFR calc Af Amer: >90 mL/min (ref >90)
GFR calc non Af Amer: >90 mL/min (ref >90)
Glucose, Bld: 83 mg/dL (ref 70–99)
Potassium: 3.9 mEq/L (ref 3.5–5.1)
Sodium: 138 mEq/L (ref 135–145)

## 2013-06-01 LAB — POCT PREGNANCY, URINE: Preg Test, Ur: NEGATIVE

## 2013-06-01 LAB — POCT I-STAT TROPONIN I: Troponin i, poc: 0 ng/mL (ref 0.00–0.08)

## 2013-06-01 LAB — D-DIMER, QUANTITATIVE: D-Dimer, Quant: 0.83 ug/mL-FEU — ABNORMAL HIGH (ref 0.00–0.48)

## 2013-06-01 MED ORDER — SODIUM CHLORIDE 0.9 % IV BOLUS (SEPSIS)
1000.0000 mL | Freq: Once | INTRAVENOUS | Status: AC
  Administered 2013-06-01: 1000 mL via INTRAVENOUS

## 2013-06-01 MED ORDER — AZITHROMYCIN 250 MG PO TABS: 250.0000 mg | ORAL_TABLET | Freq: Every day | ORAL | Status: DC

## 2013-06-01 MED ORDER — IBUPROFEN 600 MG PO TABS: 600.0000 mg | ORAL_TABLET | Freq: Four times a day (QID) | ORAL | Status: DC | PRN

## 2013-06-01 MED ORDER — AZITHROMYCIN 250 MG PO TABS
500.0000 mg | ORAL_TABLET | Freq: Once | ORAL | Status: AC
  Administered 2013-06-01: 500 mg via ORAL
  Filled 2013-06-01: qty 2

## 2013-06-01 MED ORDER — ACETAMINOPHEN-CODEINE #3 300-30 MG PO TABS
1.0000 | ORAL_TABLET | Freq: Four times a day (QID) | ORAL | Status: DC | PRN
Start: 2013-06-01 — End: 2014-03-19

## 2013-06-01 MED ORDER — ASPIRIN 81 MG PO CHEW
162.0000 mg | CHEWABLE_TABLET | Freq: Once | ORAL | Status: AC
  Administered 2013-06-01: 162 mg via ORAL
  Filled 2013-06-01: qty 2

## 2013-06-01 MED ORDER — IOHEXOL 350 MG/ML SOLN
100.0000 mL | Freq: Once | INTRAVENOUS | Status: AC | PRN
  Administered 2013-06-01: 100 mL via INTRAVENOUS

## 2013-06-01 MED ORDER — MORPHINE SULFATE 4 MG/ML IJ SOLN
4.0000 mg | Freq: Once | INTRAMUSCULAR | Status: AC
  Administered 2013-06-01: 4 mg via INTRAVENOUS
  Filled 2013-06-01: qty 1

## 2013-06-01 NOTE — ED Notes (Signed)
MD at bedside. 

## 2013-06-01 NOTE — ED Provider Notes (Signed)
History    CSN: [id number] Arrival date & time 06/01/13  1530  First MD Initiated Contact with Patient 06/01/13 1533     Chief Complaint  Patient presents with  . Cough  . Shortness of Breath  . Chest Pain   (Consider location/radiation/quality/duration/timing/severity/associated sxs/prior Treatment) HPI Comments: Pt comes in with cc of chest pain, dib, cough. Pt has been having some pain, mid-sternal, since Sunday. The pain is described as achy pain, worse with inspiration. The pain started on Sunday, and has not resolved. Not exertional. Does have some dyspnea, no nausea.   Patient is a 32 y.o. female presenting with cough, shortness of breath, and chest pain. The history is provided by the patient.  Cough Associated symptoms: chest pain and shortness of breath   Associated symptoms: no headaches   Shortness of Breath Associated symptoms: chest pain and cough   Associated symptoms: no abdominal pain, no headaches, no neck pain and no vomiting   Chest Pain Associated symptoms: cough and shortness of breath   Associated symptoms: no abdominal pain, no headache, no nausea and not vomiting    Past Medical History  Diagnosis Date  . Pinched nerve     in back; recently completed course of meds for sxs   History reviewed. No pertinent past surgical history. No family history on file. History  Substance Use Topics  . Smoking status: Never Smoker   . Smokeless tobacco: Not on file  . Alcohol Use: No   OB History   Grav Para Term Preterm Abortions TAB SAB Ect Mult Living                 Review of Systems  Constitutional: Negative for activity change.  HENT: Negative for neck pain.   Respiratory: Positive for cough and shortness of breath.   Cardiovascular: Positive for chest pain.  Gastrointestinal: Negative for nausea, vomiting and abdominal pain.  Genitourinary: Negative for dysuria.  Neurological: Negative for headaches.    Allergies  Review of patient's  allergies indicates no known allergies.  Home Medications   Current Outpatient Rx  Name  Route  Sig  Dispense  Refill  . acetaminophen (TYLENOL) 500 MG tablet   Oral   Take 1,000 mg by mouth every 6 (six) hours as needed for pain.         Marland Kitchen ibuprofen (ADVIL,MOTRIN) 200 MG tablet   Oral   Take 600 mg by mouth every 8 (eight) hours as needed for pain.         Marland Kitchen trimethoprim-polymyxin b (POLYTRIM) ophthalmic solution   Right Eye   Place 1 drop into the right eye every 4 (four) hours.   10 mL   0    BP 151/91  Pulse 99  Temp(Src) 98.6 F (37 C) (Oral)  Resp 18  SpO2 100%  LMP 05/08/2013 Physical Exam  Nursing note and vitals reviewed. Constitutional: She is oriented to person, place, and time. She appears well-developed and well-nourished.  HENT:  Head: Normocephalic and atraumatic.  Eyes: EOM are normal. Pupils are equal, round, and reactive to light.  Neck: Neck supple.  Cardiovascular: Normal rate, regular rhythm and normal heart sounds.   Pulmonary/Chest: Effort normal. No respiratory distress.  Abdominal: Soft. She exhibits no distension. There is no tenderness. There is no rebound and no guarding.  Neurological: She is alert and oriented to person, place, and time.  Skin: Skin is warm and dry.    ED Course  Procedures (including critical care time)  Labs Reviewed  CBC WITH DIFFERENTIAL  BASIC METABOLIC PANEL  D-DIMER, QUANTITATIVE   No results found. No diagnosis found.  MDM   Date: 06/01/2013  Rate: 92  Rhythm: normal sinus rhythm  QRS Axis: normal  Intervals: normal  ST/T Wave abnormalities: normal  Conduction Disutrbances:none  Narrative Interpretation:   Old EKG Reviewed: none available  Differential diagnosis includes: ACS syndrome Myocarditis Pericarditis PTX Pneumonia PE Musculoskeletal pain  Pt comes in with cc of chest pain. Atypical. No cardiac risk factors. Pain is pleuritic, and she was tachycardic at my evaluation, on birth  control. Will get d-dimer and CT PE if needed for a rule out.     Derwood Kaplan, MD 06/01/13 [phone]

## 2013-06-01 NOTE — ED Notes (Signed)
Since Sunday has been hainvg cough, sob, and chest pain. Constant at times.

## 2014-03-19 ENCOUNTER — Emergency Department (HOSPITAL_COMMUNITY)
Admission: EM | Admit: 2014-03-19 | Discharge: 2014-03-19 | Disposition: A | Discharge status: Home / Self Care | Payer: No Typology Code available for payment source | Attending: Emergency Medicine | Admitting: Emergency Medicine

## 2014-03-19 DIAGNOSIS — Z8669 Personal history of other diseases of the nervous system and sense organs: Secondary | ICD-10-CM | POA: Insufficient documentation

## 2014-03-19 DIAGNOSIS — Z79899 Other long term (current) drug therapy: Secondary | ICD-10-CM | POA: Insufficient documentation

## 2014-03-19 DIAGNOSIS — J069 Acute upper respiratory infection, unspecified: Secondary | ICD-10-CM | POA: Insufficient documentation

## 2014-03-19 LAB — RAPID STREP SCREEN (MED CTR MEBANE ONLY): Streptococcus, Group A Screen (Direct): NEGATIVE

## 2014-03-19 MED ORDER — IBUPROFEN 400 MG PO TABS
600.0000 mg | ORAL_TABLET | Freq: Once | ORAL | Status: AC
  Administered 2014-03-19: 600 mg via ORAL
  Filled 2014-03-19 (×2): qty 1

## 2014-03-19 MED ORDER — SALINE SPRAY 0.65 % NA SOLN: 1.0000 | NASAL | Status: DC | PRN

## 2014-03-19 MED ORDER — IBUPROFEN 600 MG PO TABS: 600.0000 mg | ORAL_TABLET | Freq: Four times a day (QID) | ORAL | Status: DC | PRN

## 2014-03-19 MED ORDER — DEXAMETHASONE SODIUM PHOSPHATE 10 MG/ML IJ SOLN
10.0000 mg | Freq: Once | INTRAMUSCULAR | Status: AC
  Administered 2014-03-19: 10 mg via INTRAMUSCULAR
  Filled 2014-03-19: qty 1

## 2014-03-19 NOTE — Discharge Instructions (Signed)
Viral Infections A viral infection can be caused by different types of viruses.Most viral infections are not serious and resolve on their own. However, some infections may cause severe symptoms and may lead to further complications. SYMPTOMS Viruses can frequently cause:  Minor sore throat.  Aches and pains.  Headaches.  Runny nose.  Different types of rashes.  Watery eyes.  Tiredness.  Cough.  Loss of appetite.  Gastrointestinal infections, resulting in nausea, vomiting, and diarrhea. These symptoms do not respond to antibiotics because the infection is not caused by bacteria. However, you might catch a bacterial infection following the viral infection. This is sometimes called a "superinfection." Symptoms of such a bacterial infection may include:  Worsening sore throat with pus and difficulty swallowing.  Swollen neck glands.  Chills and a high or persistent fever.  Severe headache.  Tenderness over the sinuses.  Persistent overall ill feeling (malaise), muscle aches, and tiredness (fatigue).  Persistent cough.  Yellow, green, or brown mucus production with coughing. HOME CARE INSTRUCTIONS   Only take over-the-counter or prescription medicines for pain, discomfort, diarrhea, or fever as directed by your caregiver.  Drink enough water and fluids to keep your urine clear or pale yellow. Sports drinks can provide valuable electrolytes, sugars, and hydration.  Get plenty of rest and maintain proper nutrition. Soups and broths with crackers or rice are fine. SEEK IMMEDIATE MEDICAL CARE IF:   You have severe headaches, shortness of breath, chest pain, neck pain, or an unusual rash.  You have uncontrolled vomiting, diarrhea, or you are unable to keep down fluids.  You or your child has an oral temperature above 102 F (38.9 C), not controlled by medicine.  Your baby is older than 3 months with a rectal temperature of 102 F (38.9 C) or higher.  Your baby is 62  months old or younger with a rectal temperature of 100.4 F (38 C) or higher. MAKE SURE YOU:   Understand these instructions.  Will watch your condition.  Will get help right away if you are not doing well or get worse. Document Released: 08/28/2005 Document Revised: 02/10/2012 Document Reviewed: 03/25/2011 [facility] Patient Information 2014 [place], [place].  Upper Respiratory Infection, Adult An upper respiratory infection (URI) is also sometimes known as the common cold. The upper respiratory tract includes the nose, sinuses, throat, trachea, and bronchi. Bronchi are the airways leading to the lungs. Most people improve within 1 week, but symptoms can last up to 2 weeks. A residual cough may last even longer.  CAUSES Many different viruses can infect the tissues lining the upper respiratory tract. The tissues become irritated and inflamed and often become very moist. Mucus production is also common. A cold is contagious. You can easily spread the virus to others by oral contact. This includes kissing, sharing a glass, coughing, or sneezing. Touching your mouth or nose and then touching a surface, which is then touched by another person, can also spread the virus. SYMPTOMS  Symptoms typically develop 1 to 3 days after you come in contact with a cold virus. Symptoms vary from person to person. They may include:  Runny nose.  Sneezing.  Nasal congestion.  Sinus irritation.  Sore throat.  Loss of voice (laryngitis).  Cough.  Fatigue.  Muscle aches.  Loss of appetite.  Headache.  Low-grade fever. DIAGNOSIS  You might diagnose your own cold based on familiar symptoms, since most people get a cold 2 to 3 times a year. Your caregiver can confirm this based on your  exam. Most importantly, your caregiver can check that your symptoms are not due to another disease such as strep throat, sinusitis, pneumonia, asthma, or epiglottitis. Blood tests, throat tests, and X-rays are not  necessary to diagnose a common cold, but they may sometimes be helpful in excluding other more serious diseases. Your caregiver will decide if any further tests are required. RISKS AND COMPLICATIONS  You may be at risk for a more severe case of the common cold if you smoke cigarettes, have chronic heart disease (such as heart failure) or lung disease (such as asthma), or if you have a weakened immune system. The very young and very old are also at risk for more serious infections. Bacterial sinusitis, middle ear infections, and bacterial pneumonia can complicate the common cold. The common cold can worsen asthma and chronic obstructive pulmonary disease (COPD). Sometimes, these complications can require emergency medical care and may be life-threatening. PREVENTION  The best way to protect against getting a cold is to practice good hygiene. Avoid oral or hand contact with people with cold symptoms. Wash your hands often if contact occurs. There is no clear evidence that vitamin C, vitamin E, echinacea, or exercise reduces the chance of developing a cold. However, it is always recommended to get plenty of rest and practice good nutrition. TREATMENT  Treatment is directed at relieving symptoms. There is no cure. Antibiotics are not effective, because the infection is caused by a virus, not by bacteria. Treatment may include:  Increased fluid intake. Sports drinks offer valuable electrolytes, sugars, and fluids.  Breathing heated mist or steam (vaporizer or shower).  Eating chicken soup or other clear broths, and maintaining good nutrition.  Getting plenty of rest.  Using gargles or lozenges for comfort.  Controlling fevers with ibuprofen or acetaminophen as directed by your caregiver.  Increasing usage of your inhaler if you have asthma. Zinc gel and zinc lozenges, taken in the first 24 hours of the common cold, can shorten the duration and lessen the severity of symptoms. Pain medicines may help  with fever, muscle aches, and throat pain. A variety of non-prescription medicines are available to treat congestion and runny nose. Your caregiver can make recommendations and may suggest nasal or lung inhalers for other symptoms.  HOME CARE INSTRUCTIONS   Only take over-the-counter or prescription medicines for pain, discomfort, or fever as directed by your caregiver.  Use a warm mist humidifier or inhale steam from a shower to increase air moisture. This may keep secretions moist and make it easier to breathe.  Drink enough water and fluids to keep your urine clear or pale yellow.  Rest as needed.  Return to work when your temperature has returned to normal or as your caregiver advises. You may need to stay home longer to avoid infecting others. You can also use a face mask and careful hand washing to prevent spread of the virus. SEEK MEDICAL CARE IF:   After the first few days, you feel you are getting worse rather than better.  You need your caregiver's advice about medicines to control symptoms.  You develop chills, worsening shortness of breath, or brown or red sputum. These may be signs of pneumonia.  You develop yellow or brown nasal discharge or pain in the face, especially when you bend forward. These may be signs of sinusitis.  You develop a fever, swollen neck glands, pain with swallowing, or white areas in the back of your throat. These may be signs of strep throat. SEEK IMMEDIATE  MEDICAL CARE IF:  °· You have a fever. °· You develop severe or persistent headache, ear pain, sinus pain, or chest pain. °· You develop wheezing, a prolonged cough, cough up blood, or have a change in your usual mucus (if you have chronic lung disease). °· You develop sore muscles or a stiff neck. °Document Released: 05/14/2001 Document Revised: 02/10/2012 Document Reviewed: 03/22/2011 °[employer]® Patient Information ©2014 [employer]. ° °

## 2014-03-19 NOTE — ED Notes (Signed)
Pt c/o headache with cold symptoms starting yesterday. Pt n/v/d. Pt states she took benadryl without relief. Pt is A&Ox4, respirations equal and unlabored, skin warm and dry.

## 2014-03-19 NOTE — ED Provider Notes (Signed)
CSN: [id number]     Arrival date & time 03/19/14  0219 History   First MD Initiated Contact with Patient 03/19/14 [phone]     Chief Complaint  Patient presents with  . URI     (Consider location/radiation/quality/duration/timing/severity/associated sxs/prior Treatment) HPI Comments: Victoria Harvey is a 33 y.o. female who complains of congestion, sneezing, sore throat, nasal blockage, post nasal drip and dry cough for 1 days. She denies a history of chest pain, fevers, myalgias, shortness of breath, vomiting and wheezing and denies a history of asthma. Patient denies smoke cigarettes.    Patient is a 33 y.o. female presenting with URI. The history is provided by the patient.  URI Presenting symptoms: congestion, rhinorrhea and sore throat   Associated symptoms: headaches   Associated symptoms: no neck pain     Past Medical History  Diagnosis Date  . Pinched nerve     in back; recently completed course of meds for sxs   No past surgical history on file. No family history on file. History  Substance Use Topics  . Smoking status: Never Smoker   . Smokeless tobacco: Not on file  . Alcohol Use: No   OB History   Grav Para Term Preterm Abortions TAB SAB Ect Mult Living                 Review of Systems  Constitutional: Negative for activity change.  HENT: Positive for congestion, postnasal drip, rhinorrhea, sore throat and voice change. Negative for drooling and trouble swallowing.   Respiratory: Negative for shortness of breath.   Cardiovascular: Negative for chest pain.  Gastrointestinal: Negative for nausea, vomiting and abdominal pain.  Genitourinary: Negative for dysuria.  Musculoskeletal: Negative for neck pain.  Neurological: Positive for headaches.      Allergies  Review of patient's allergies indicates no known allergies.  Home Medications   Prior to Admission medications   Medication Sig Start Date End Date Taking? Authorizing Provider  JUNEL 1/20 1-20  MG-MCG tablet Take 1 tablet by mouth daily.  03/17/14  Yes Historical Provider, MD  metroNIDAZOLE (METROGEL) 0.75 % vaginal gel Place 1 Applicatorful vaginally at bedtime. 5 day treatment beginning 03/16/14 03/17/14  Yes Historical Provider, MD  ibuprofen (ADVIL,MOTRIN) 600 MG tablet Take 1 tablet (600 mg total) by mouth every 6 (six) hours as needed. 03/19/14   Varney Biles, MD  sodium chloride (OCEAN) 0.65 % SOLN nasal spray Place 1 spray into both nostrils as needed for congestion. 03/19/14   Nassir Neidert Kathrynn Humble, MD   BP 132/85  Pulse 97  Temp(Src) 99.5 F (37.5 C) (Oral)  Resp 14  Ht 5\' 7"  (1.702 m)  Wt 161 lb (73.029 kg)  BMI 25.21 kg/m2  SpO2 99%  LMP 01/19/2014 Physical Exam  Nursing note and vitals reviewed. Constitutional: She is oriented to person, place, and time. She appears well-developed and well-nourished.  HENT:  Head: Normocephalic and atraumatic.  Mouth/Throat: No oropharyngeal exudate.  Eyes: EOM are normal. Pupils are equal, round, and reactive to light.  Neck: Neck supple.  Cardiovascular: Normal rate, regular rhythm and normal heart sounds.   No murmur heard. Pulmonary/Chest: Effort normal. No respiratory distress.  Abdominal: Soft. She exhibits no distension. There is no tenderness. There is no rebound and no guarding.  Lymphadenopathy:    She has cervical adenopathy.  Neurological: She is alert and oriented to person, place, and time.  Skin: Skin is warm and dry.    ED Course  Procedures (including critical care time)  Labs Review Labs Reviewed  RAPID STREP SCREEN  CULTURE, GROUP A STREP    Imaging Review No results found.   EKG Interpretation None      MDM   Final diagnoses:  URI, acute    Pt comes in with cc of headaches and URI like sx. Seems like all viral syndrome. Rapid strep is negative. Will give some meds for symptom relief.   Varney Biles, MD 03/19/14 [phone]

## 2014-03-21 LAB — CULTURE, GROUP A STREP

## 2014-04-20 ENCOUNTER — Ambulatory Visit: Payer: No Typology Code available for payment source | Admitting: Family Medicine

## 2014-06-18 ENCOUNTER — Encounter (HOSPITAL_COMMUNITY): Payer: Self-pay | Admitting: Emergency Medicine

## 2014-06-18 ENCOUNTER — Emergency Department (HOSPITAL_COMMUNITY)
Admission: EM | Admit: 2014-06-18 | Discharge: 2014-06-18 | Disposition: A | Discharge status: Home / Self Care | Payer: No Typology Code available for payment source | Attending: Emergency Medicine | Admitting: Emergency Medicine

## 2014-06-18 DIAGNOSIS — K0889 Other specified disorders of teeth and supporting structures: Secondary | ICD-10-CM

## 2014-06-18 DIAGNOSIS — K089 Disorder of teeth and supporting structures, unspecified: Secondary | ICD-10-CM | POA: Insufficient documentation

## 2014-06-18 DIAGNOSIS — Z79899 Other long term (current) drug therapy: Secondary | ICD-10-CM | POA: Insufficient documentation

## 2014-06-18 MED ORDER — PENICILLIN V POTASSIUM 500 MG PO TABS: 500.0000 mg | ORAL_TABLET | Freq: Four times a day (QID) | ORAL | Status: DC

## 2014-06-18 MED ORDER — TRAMADOL HCL 50 MG PO TABS: 50.0000 mg | ORAL_TABLET | Freq: Four times a day (QID) | ORAL | Status: DC | PRN

## 2014-06-18 NOTE — ED Provider Notes (Signed)
Medical screening examination/treatment/procedure(s) were performed by non-physician practitioner and as supervising physician I was immediately available for consultation/collaboration.   EKG Interpretation None        Hoy Morn, MD 06/18/14 2035

## 2014-06-18 NOTE — Discharge Instructions (Signed)
Take Veetid as directed until gone. Take Tramadol as needed for pain. Refer to attached documents for more information. Follow up with Dr. Junita Push for further evaluation and management.

## 2014-06-18 NOTE — ED Notes (Signed)
Pt with dental pain x 3 days.  Area has given trouble in the past.  Tooth is broken per patient.

## 2014-06-18 NOTE — ED Provider Notes (Signed)
CSN: [id number]     Arrival date & time 06/18/14  1606 History  This chart was scribed for non-physician practitioner, Alvina Chou, PA-C, working with Hoy Morn, MD, by Delphia Grates, ED Scribe. This patient was seen in room WTR7/WTR7 and the patient's care was started at 4:34 PM.    Chief Complaint  Patient presents with  . Dental Pain      Patient is a 33 y.o. female presenting with tooth pain. The history is provided by the patient. No language interpreter was used.  Dental Pain Location:  Upper Severity:  Moderate Onset quality:  Sudden Duration:  3 days Timing:  Intermittent Progression:  Unchanged Chronicity:  Recurrent Relieved by:  Nothing Ineffective treatments:  Acetaminophen Associated symptoms: no facial pain, no facial swelling and no fever     HPI Comments: Victoria Harvey is a 33 y.o. female who presents to the Emergency Department complaining of intermittent, moderate, right upper dental pain that began 3 days ago. Patient reports history of the same and suspects she has a broken tooth. Patient has taken Tylenol Extra Strength without significant improvement. She denies fevers or facial swelling. Patient is not established with a dentist.   Past Medical History  Diagnosis Date  . Pinched nerve     in back; recently completed course of meds for sxs   History reviewed. No pertinent past surgical history. History reviewed. No pertinent family history. History  Substance Use Topics  . Smoking status: Never Smoker   . Smokeless tobacco: Not on file  . Alcohol Use: No   OB History   Grav Para Term Preterm Abortions TAB SAB Ect Mult Living                 Review of Systems  Constitutional: Negative for fever.  HENT: Positive for dental problem. Negative for facial swelling.   All other systems reviewed and are negative.     Allergies  Review of patient's allergies indicates no known allergies.  Home Medications   Prior to Admission  medications   Medication Sig Start Date End Date Taking? Authorizing Provider  ibuprofen (ADVIL,MOTRIN) 600 MG tablet Take 1 tablet (600 mg total) by mouth every 6 (six) hours as needed. 03/19/14   Varney Biles, MD  JUNEL 1/20 1-20 MG-MCG tablet Take 1 tablet by mouth daily.  03/17/14   Historical Provider, MD  metroNIDAZOLE (METROGEL) 0.75 % vaginal gel Place 1 Applicatorful vaginally at bedtime. 5 day treatment beginning 03/16/14 03/17/14   Historical Provider, MD  sodium chloride (OCEAN) 0.65 % SOLN nasal spray Place 1 spray into both nostrils as needed for congestion. 03/19/14   Varney Biles, MD   Triage Vitals: BP 154/92  Pulse 87  Temp(Src) 98.7 F (37.1 C) (Oral)  Resp 16  SpO2 100%  LMP 06/04/2014  Physical Exam  Nursing note and vitals reviewed. Constitutional: She is oriented to person, place, and time. She appears well-developed and well-nourished. No distress.  HENT:  Head: Normocephalic and atraumatic.  Right upper gingival TTP without abscess noted. No tooth tenderness to percussion. Fair dentition.  Eyes: Conjunctivae and EOM are normal.  Neck: Neck supple. No tracheal deviation present.  Cardiovascular: Normal rate.   Pulmonary/Chest: Effort normal. No respiratory distress.  Musculoskeletal: Normal range of motion.  Neurological: She is alert and oriented to person, place, and time.  Skin: Skin is warm and dry.  Psychiatric: She has a normal mood and affect. Her behavior is normal.    ED Course  Procedures (including critical care time)  DIAGNOSTIC STUDIES: Oxygen Saturation is 100% on room air, normal by my interpretation.    COORDINATION OF CARE: At 1638 Discussed treatment plan with patient which includes Ultram and Veetid. Patient agrees.   Labs Review Labs Reviewed - No data to display  Imaging Review No results found.   EKG Interpretation None      MDM   Final diagnoses:  Pain, dental    Patient will have Tramadol and Veetid for pain.  Patient instructed to follow up with her dentist. Vitals stable and patient afebrile.   I personally performed the services described in this documentation, which was scribed in my presence. The recorded information has been reviewed and is accurate.    Alvina Chou, PA-C 06/18/14 1924

## 2014-07-22 ENCOUNTER — Encounter (HOSPITAL_COMMUNITY): Payer: Self-pay | Admitting: Emergency Medicine

## 2014-07-22 ENCOUNTER — Emergency Department (HOSPITAL_COMMUNITY)
Admission: EM | Admit: 2014-07-22 | Discharge: 2014-07-22 | Disposition: A | Discharge status: Home / Self Care | Payer: No Typology Code available for payment source | Attending: Emergency Medicine | Admitting: Emergency Medicine

## 2014-07-22 DIAGNOSIS — F17200 Nicotine dependence, unspecified, uncomplicated: Secondary | ICD-10-CM | POA: Insufficient documentation

## 2014-07-22 DIAGNOSIS — Z79899 Other long term (current) drug therapy: Secondary | ICD-10-CM | POA: Diagnosis not present

## 2014-07-22 DIAGNOSIS — J020 Streptococcal pharyngitis: Secondary | ICD-10-CM | POA: Insufficient documentation

## 2014-07-22 DIAGNOSIS — J029 Acute pharyngitis, unspecified: Secondary | ICD-10-CM | POA: Insufficient documentation

## 2014-07-22 DIAGNOSIS — Z8669 Personal history of other diseases of the nervous system and sense organs: Secondary | ICD-10-CM | POA: Insufficient documentation

## 2014-07-22 LAB — RAPID STREP SCREEN (MED CTR MEBANE ONLY): Streptococcus, Group A Screen (Direct): POSITIVE — AB

## 2014-07-22 MED ORDER — AMOXICILLIN 400 MG/5ML PO SUSR: 500.0000 mg | Freq: Two times a day (BID) | ORAL | Status: DC

## 2014-07-22 MED ORDER — HYDROCODONE-ACETAMINOPHEN 5-325 MG PO TABS: 1.0000 | ORAL_TABLET | Freq: Four times a day (QID) | ORAL | Status: DC | PRN

## 2014-07-22 MED ORDER — HYDROCODONE-ACETAMINOPHEN 7.5-325 MG PO TABS: 1.0000 | ORAL_TABLET | Freq: Four times a day (QID) | ORAL | Status: DC | PRN

## 2014-07-22 MED ORDER — ACETAMINOPHEN 500 MG PO TABS
1000.0000 mg | ORAL_TABLET | Freq: Once | ORAL | Status: AC
  Administered 2014-07-22: 1000 mg via ORAL
  Filled 2014-07-22: qty 2

## 2014-07-22 NOTE — ED Notes (Signed)
Patient states sore throat that started this morning.   Patient states hasn't taken anything for pain.   Patient states "hurts when I swallow on the left side".

## 2014-07-22 NOTE — ED Provider Notes (Signed)
CSN: [id number]     Arrival date & time 07/22/14  2440 History   None    Chief Complaint  Patient presents with  . Sore Throat     (Consider location/radiation/quality/duration/timing/severity/associated sxs/prior Treatment) HPI Victoria Harvey this is a 33 year old female who presents to the emergency chief complaint of sore throat. Patient states she noticed swelling and pain on the left side of her neck. She has pain with swallowing. She denies any dental problems, headache, fever. No known contacts with similar symptoms. She denies a history of strep throat. Patient states that pain is worsening over the past day. She is able to tolerate her own secretions but states she has pain with swallowing. Has not taken anything for her pain. She rates the pain at 9/10.  The patient is able to speak in full sentences and is testing during the conversation. Pain scale appears disproportionate to level of comfort observed in the patient.  Past Medical History  Diagnosis Date  . Pinched nerve     in back; recently completed course of meds for sxs   History reviewed. No pertinent past surgical history. No family history on file. History  Substance Use Topics  . Smoking status: Current Every Day Smoker -- 0.50 packs/day    Types: Cigarettes  . Smokeless tobacco: Not on file  . Alcohol Use: No   OB History   Grav Para Term Preterm Abortions TAB SAB Ect Mult Living                 Review of Systems  Ten systems reviewed and are negative for acute change, except as noted in the HPI.    Allergies  Review of patient's allergies indicates no known allergies.  Home Medications   Prior to Admission medications   Medication Sig Start Date End Date Taking? Authorizing Provider  ibuprofen (ADVIL,MOTRIN) 600 MG tablet Take 1 tablet (600 mg total) by mouth every 6 (six) hours as needed. 03/19/14   Varney Biles, MD  JUNEL 1/20 1-20 MG-MCG tablet Take 1 tablet by mouth daily.  03/17/14    Historical Provider, MD  metroNIDAZOLE (METROGEL) 0.75 % vaginal gel Place 1 Applicatorful vaginally at bedtime. 5 day treatment beginning 03/16/14 03/17/14   Historical Provider, MD  penicillin v potassium (VEETID) 500 MG tablet Take 1 tablet (500 mg total) by mouth 4 (four) times daily. 06/18/14   Kaitlyn Szekalski, PA-C  sodium chloride (OCEAN) 0.65 % SOLN nasal spray Place 1 spray into both nostrils as needed for congestion. 03/19/14   Varney Biles, MD  traMADol (ULTRAM) 50 MG tablet Take 1 tablet (50 mg total) by mouth every 6 (six) hours as needed. 06/18/14   Kaitlyn Szekalski, PA-C   BP 133/87  Pulse 88  Temp(Src) 98.5 F (36.9 C) (Oral)  Resp 20  Ht 5\' 7"  (1.702 m)  Wt 155 lb (70.308 kg)  BMI 24.27 kg/m2  SpO2 100%  LMP 06/30/2014 Physical Exam  Constitutional: She is oriented to person, place, and time. She appears well-developed and well-nourished. No distress.  HENT:  Head: Normocephalic and atraumatic.  Mouth/Throat: Uvula is midline. Oropharyngeal exudate and posterior oropharyngeal erythema present. No posterior oropharyngeal edema or tonsillar abscesses.  Eyes: Conjunctivae are normal. No scleral icterus.  Neck: Normal range of motion.  Cardiovascular: Normal rate, regular rhythm and normal heart sounds.  Exam reveals no gallop and no friction rub.   No murmur heard. Pulmonary/Chest: Effort normal and breath sounds normal. No respiratory distress.  Abdominal: Soft. Bowel  sounds are normal. She exhibits no distension and no mass. There is no tenderness. There is no guarding.  Neurological: She is alert and oriented to person, place, and time.  Skin: Skin is warm and dry. She is not diaphoretic.    ED Course  Procedures (including critical care time) Labs Review Labs Reviewed  RAPID STREP SCREEN    Imaging Review No results found.   EKG Interpretation None      MDM   Final diagnoses:  None   9:24 AM BP 133/87  Pulse 88  Temp(Src) 98.5 F (36.9 C)  (Oral)  Resp 20  Ht 5\' 7"  (1.702 m)  Wt 155 lb (70.308 kg)  BMI 24.27 kg/m2  SpO2 100%  LMP 06/30/2014 Pt febrile with tonsillar exudate, cervical lymphadenopathy, & dysphagia; diagnosis of strep. Treated in the Ed with steroids, NSAIDs, Pain medication and PCN IM.  Pt appears mildly dehydrated, discussed importance of water rehydration. Presentation non concerning for PTA or infxn spread to soft tissue. No trismus or uvula deviation. Specific return precautions discussed. Pt able to drink water in ED without difficulty with intact air way. Recommended PCP follow up.      Margarita Mail, PA-C 07/22/14 [phone]

## 2014-07-22 NOTE — Discharge Instructions (Signed)

## 2014-07-23 NOTE — ED Provider Notes (Signed)
Medical screening examination/treatment/procedure(s) were performed by non-physician practitioner and as supervising physician I was immediately available for consultation/collaboration.   EKG Interpretation None        Francine Graven, DO 07/23/14 1610

## 2014-08-20 ENCOUNTER — Emergency Department (HOSPITAL_COMMUNITY)
Admission: EM | Admit: 2014-08-20 | Discharge: 2014-08-20 | Disposition: A | Discharge status: Home / Self Care | Payer: No Typology Code available for payment source | Attending: Emergency Medicine | Admitting: Emergency Medicine

## 2014-08-20 ENCOUNTER — Encounter (HOSPITAL_COMMUNITY): Payer: Self-pay | Admitting: Emergency Medicine

## 2014-08-20 DIAGNOSIS — K089 Disorder of teeth and supporting structures, unspecified: Secondary | ICD-10-CM | POA: Diagnosis not present

## 2014-08-20 DIAGNOSIS — Z8669 Personal history of other diseases of the nervous system and sense organs: Secondary | ICD-10-CM | POA: Insufficient documentation

## 2014-08-20 DIAGNOSIS — Z8619 Personal history of other infectious and parasitic diseases: Secondary | ICD-10-CM | POA: Insufficient documentation

## 2014-08-20 DIAGNOSIS — G8929 Other chronic pain: Secondary | ICD-10-CM | POA: Insufficient documentation

## 2014-08-20 DIAGNOSIS — Z792 Long term (current) use of antibiotics: Secondary | ICD-10-CM | POA: Diagnosis not present

## 2014-08-20 DIAGNOSIS — K006 Disturbances in tooth eruption: Secondary | ICD-10-CM | POA: Diagnosis not present

## 2014-08-20 DIAGNOSIS — F17200 Nicotine dependence, unspecified, uncomplicated: Secondary | ICD-10-CM | POA: Diagnosis not present

## 2014-08-20 HISTORY — DX: Streptococcal pharyngitis: J02.0

## 2014-08-20 MED ORDER — AMOXICILLIN 500 MG PO CAPS: 500.0000 mg | ORAL_CAPSULE | Freq: Three times a day (TID) | ORAL | Status: DC

## 2014-08-20 MED ORDER — HYDROCODONE-ACETAMINOPHEN 5-325 MG PO TABS: ORAL_TABLET | ORAL | Status: DC

## 2014-08-20 NOTE — ED Notes (Signed)
Patient had two 2 teeth removed and a cyst on upper right side.  Patient continues to have pain she rates as a 9/10.  She returned twice to her dentist after the procedure once for a follow up a week after, then in two weeks she went again with the complaint of pain.  He reported that it was part of the healing process.  Pain has been consistently the same since her procedure.

## 2014-08-20 NOTE — ED Provider Notes (Signed)
CSN: [id number]     Arrival date & time 08/20/14  1316 History  This chart was scribed for non-physician practitioner, Monico Blitz, PA-C,working with Dot Lanes, MD, by Marlowe Kays, ED Scribe. This patient was seen in room WTR7/WTR7 and the patient's care was started at 2:59 PM.  Chief Complaint  Patient presents with  . Dental Pain   The history is provided by the patient. No language interpreter was used.   HPI Comments:  Victoria Harvey is a 33 y.o. female who presents to the Emergency Department complaining of severe upper right sided dental pain that began two months ago. She states states the pain began getting worse secondary to having two teeth extracted and having a cyst removed as well. She reports she followed up with the dentist two weeks after the procedure and was told that everything was healing properly. She has been taking OTC Ibuprofen with minimal relief of the pain. She denies fever, chills, facial swelling or drainage from the area.   Past Medical History  Diagnosis Date  . Pinched nerve     in back; recently completed course of meds for sxs  . Strep sore throat    History reviewed. No pertinent past surgical history. History reviewed. No pertinent family history. History  Substance Use Topics  . Smoking status: Current Every Day Smoker -- 0.50 packs/day    Types: Cigarettes  . Smokeless tobacco: Not on file  . Alcohol Use: No   OB History   Grav Para Term Preterm Abortions TAB SAB Ect Mult Living                 Review of Systems  Constitutional: Negative for fever and chills.  HENT: Positive for dental problem. Negative for facial swelling.   All other systems reviewed and are negative.   Allergies  Review of patient's allergies indicates no known allergies.  Home Medications   Prior to Admission medications   Medication Sig Start Date End Date Taking? Authorizing Provider  amoxicillin (AMOXIL) 400 MG/5ML suspension Take 6.3 mLs (500  mg total) by mouth 2 (two) times daily. For 10 days. 07/22/14   Margarita Mail, PA-C  amoxicillin (AMOXIL) 500 MG capsule Take 1 capsule (500 mg total) by mouth 3 (three) times daily. 08/20/14   Luciano Cinquemani, PA-C  HYDROcodone-acetaminophen (NORCO) 5-325 MG per tablet Take 1 tablet by mouth every 6 (six) hours as needed. 07/22/14   Margarita Mail, PA-C  HYDROcodone-acetaminophen (NORCO/VICODIN) 5-325 MG per tablet Take 1-2 tablets by mouth every 6 hours as needed for pain. 08/20/14   Junius Faucett, PA-C   Triage Vitals: BP 129/84  Pulse 72  Temp(Src) 98.8 F (37.1 C) (Oral)  Resp 20  Wt 154 lb (69.854 kg)  SpO2 100%  LMP 07/29/2014 Physical Exam  Nursing note and vitals reviewed. Constitutional: She is oriented to person, place, and time. She appears well-developed and well-nourished. No distress.  HENT:  Head: Normocephalic and atraumatic.  Mouth/Throat: Uvula is midline and oropharynx is clear and moist. No trismus in the jaw. No uvula swelling. No oropharyngeal exudate, posterior oropharyngeal edema, posterior oropharyngeal erythema or tonsillar abscesses.    Generally poor dentition, no gingival swelling, erythema or tenderness to palpation. Patient is handling their secretions. There is no tenderness to palpation or firmness underneath tongue bilaterally. No trismus.    Eyes: Conjunctivae and EOM are normal. Pupils are equal, round, and reactive to light.  Neck: Normal range of motion.  Cardiovascular: Normal rate.  Pulmonary/Chest: Effort normal. No stridor.  Musculoskeletal: Normal range of motion.  Lymphadenopathy:    She has no cervical adenopathy.  Neurological: She is alert and oriented to person, place, and time.  Skin: Skin is warm and dry.  Psychiatric: She has a normal mood and affect. Her behavior is normal.    ED Course  Procedures (including critical care time) DIAGNOSTIC STUDIES: Oxygen Saturation is 100% on RA, normal by my interpretation.    COORDINATION OF CARE: 3:02 PM- Will refer to dentist and prescribe antibiotic and pain medication. Pt verbalizes understanding and agrees to plan.  Medications - No data to display  Labs Review Labs Reviewed - No data to display  Imaging Review No results found.   EKG Interpretation None      MDM   Final diagnoses:  Chronic dental pain    Filed Vitals:   08/20/14 1346  BP: 129/84  Pulse: 72  Temp: 98.8 F (37.1 C)  TempSrc: Oral  Resp: 20  Weight: 154 lb (69.854 kg)  SpO2: 100%   Victoria Harvey is a 33 y.o. female presenting with pain to left upper jaw which she's had since her extraction of 2 teeth several month ago. Patient rates the pain at 9-10. On my physical exam. No Silver signs of infection. Patient will be started on antibiotics and given Norco for pain control. I have encouraged her to follow closely with her dentist.  Evaluation does not show pathology that would require ongoing emergent intervention or inpatient treatment. Pt is hemodynamically stable and mentating appropriately. Discussed findings and plan with patient/guardian, who agrees with care plan. All questions answered. Return precautions discussed and outpatient follow up given.   Discharge Medication List as of 08/20/2014  3:05 PM    START taking these medications   Details  amoxicillin (AMOXIL) 500 MG capsule Take 1 capsule (500 mg total) by mouth 3 (three) times daily., Starting 08/20/2014, Until Discontinued, Print    !! HYDROcodone-acetaminophen (NORCO/VICODIN) 5-325 MG per tablet Take 1-2 tablets by mouth every 6 hours as needed for pain., Print     !! - Potential duplicate medications found. Please discuss with provider.         I personally performed the services described in this documentation, which was scribed in my presence. The recorded information has been reviewed and is accurate.    Monico Blitz, PA-C 08/20/14 1902

## 2014-08-20 NOTE — Discharge Instructions (Signed)
Take percocet for breakthrough pain, do not drink alcohol, drive, care for children or do other critical tasks while taking Vicodin.  Return to the emergency room for fever, change in vision, redness to the face that rapidly spreads towards the eye, nausea or vomiting, difficulty swallowing or shortness of breath.   Apply warm compresses to jaw throughout the day.   Take your antibiotics as directed and to the end of the course.   Followup with a dentist is very important for ongoing evaluation and management of recurrent dental pain. Return to emergency department for emergent changing or worsening symptoms.  Low-cost dental clinic: [facility]  at [phone]**  **[facility] [place]**     Dental Pain A tooth ache may be caused by cavities (tooth decay). Cavities expose the nerve of the tooth to air and hot or cold temperatures. It may come from an infection or abscess (also called a boil or furuncle) around your tooth. It is also often caused by dental caries (tooth decay). This causes the pain you are having. DIAGNOSIS  Your caregiver can diagnose this problem by exam. TREATMENT   If caused by an infection, it may be treated with medications which kill germs (antibiotics) and pain medications as prescribed by your caregiver. Take medications as directed.  Only take over-the-counter or prescription medicines for pain, discomfort, or fever as directed by your caregiver.  Whether the tooth ache today is caused by infection or dental disease, you should see your dentist as soon as possible for further care. SEEK MEDICAL CARE IF: The exam and treatment you received today has been provided on an emergency basis only. This is not a substitute for complete medical or dental care. If your problem worsens or new problems (symptoms) appear, and you are unable to meet with your dentist, call or return to this location. SEEK IMMEDIATE MEDICAL CARE IF:   You  have a fever.  You develop redness and swelling of your face, jaw, or neck.  You are unable to open your mouth.  You have severe pain uncontrolled by pain medicine. MAKE SURE YOU:   Understand these instructions.  Will watch your condition.  Will get help right away if you are not doing well or get worse. Document Released: 11/18/2005 Document Revised: 02/10/2012 Document Reviewed: 07/06/2008 [facility] Patient Information 2015 [place], [place]. This information is not intended to replace advice given to you by your health care provider. Make sure you discuss any questions you have with your health care provider.   You may also call [phone]  Dental Assistance If the dentist on-call cannot see you, please use the resources below:   Patients with Medicaid: [place] [phone] [address], [place] [address] [phone]  If unable to pay, or uninsured, contact [facility] [phone]) or [place] [phone] in [place], [place] in [facility]) to become qualified for the adult dental clinic  Other [facility] [place], [place], [place], [address]    [phone], Ext. [phone]    2nd and 4th Thursday of the month at 6:30am    10 clients each day by appointment, can sometimes see walk-in     patients if someone does not show for an appointment [facility]- [address] [place] [place], [place], [address]    [phone] [facility]- [address], [place], [place], [address]    [phone]  [facility]- [phone] [facility] [phone]

## 2014-08-21 NOTE — ED Provider Notes (Signed)
Medical screening examination/treatment/procedure(s) were performed by non-physician practitioner and as supervising physician I was immediately available for consultation/collaboration.    Dot Lanes, MD 08/21/14 [phone]

## 2014-11-01 ENCOUNTER — Emergency Department (HOSPITAL_COMMUNITY)
Admission: EM | Admit: 2014-11-01 | Discharge: 2014-11-01 | Disposition: A | Discharge status: Home / Self Care | Payer: No Typology Code available for payment source | Attending: Emergency Medicine | Admitting: Emergency Medicine

## 2014-11-01 ENCOUNTER — Encounter (HOSPITAL_COMMUNITY): Payer: Self-pay | Admitting: *Deleted

## 2014-11-01 DIAGNOSIS — S46912A Strain of unspecified muscle, fascia and tendon at shoulder and upper arm level, left arm, initial encounter: Secondary | ICD-10-CM | POA: Insufficient documentation

## 2014-11-01 DIAGNOSIS — Z8669 Personal history of other diseases of the nervous system and sense organs: Secondary | ICD-10-CM | POA: Insufficient documentation

## 2014-11-01 DIAGNOSIS — Y9289 Other specified places as the place of occurrence of the external cause: Secondary | ICD-10-CM | POA: Insufficient documentation

## 2014-11-01 DIAGNOSIS — Y998 Other external cause status: Secondary | ICD-10-CM | POA: Diagnosis not present

## 2014-11-01 DIAGNOSIS — S46812A Strain of other muscles, fascia and tendons at shoulder and upper arm level, left arm, initial encounter: Secondary | ICD-10-CM

## 2014-11-01 DIAGNOSIS — Y9389 Activity, other specified: Secondary | ICD-10-CM | POA: Insufficient documentation

## 2014-11-01 DIAGNOSIS — Z720 Tobacco use: Secondary | ICD-10-CM | POA: Insufficient documentation

## 2014-11-01 DIAGNOSIS — Z8709 Personal history of other diseases of the respiratory system: Secondary | ICD-10-CM | POA: Insufficient documentation

## 2014-11-01 DIAGNOSIS — X58XXXA Exposure to other specified factors, initial encounter: Secondary | ICD-10-CM | POA: Insufficient documentation

## 2014-11-01 DIAGNOSIS — M79602 Pain in left arm: Secondary | ICD-10-CM | POA: Diagnosis present

## 2014-11-01 MED ORDER — CYCLOBENZAPRINE HCL 10 MG PO TABS: 10.0000 mg | ORAL_TABLET | Freq: Two times a day (BID) | ORAL | Status: DC | PRN

## 2014-11-01 MED ORDER — NAPROXEN 500 MG PO TABS
500.0000 mg | ORAL_TABLET | Freq: Once | ORAL | Status: AC
  Administered 2014-11-01: 500 mg via ORAL
  Filled 2014-11-01: qty 1

## 2014-11-01 MED ORDER — NAPROXEN 500 MG PO TABS: 500.0000 mg | ORAL_TABLET | Freq: Two times a day (BID) | ORAL | Status: DC

## 2014-11-01 NOTE — Discharge Instructions (Signed)
Please follow the directions provided.  Be sure to follow-up with the orthopedic doctor if symptoms don't improve.  Take the naproxen twice a day to decrease inflammation.  Take the flexeril twice a day for muscle spasms.  Rest your arm, use ice 20 min on 20 min  Off.  Don't hesitate to return for new, worsening or concerning symptoms.    SEEK MEDICAL CARE IF:  You have increasing pain or swelling in the injured area.  You have numbness, tingling, or a significant loss of strength in the injured area.

## 2014-11-01 NOTE — ED Notes (Signed)
Pt states on thanksgiving started having L arm pain, shooting from shoulder down to finger tips, states having some numbness/tingling in fingers/hand, states went away and came back today, no other symptoms, no other pain.

## 2014-11-01 NOTE — ED Notes (Signed)
Pt complains of "pins and needle" like feeling only in left arm. Pulses strong and equal bilaterally. Pt maintains full range of motion, warmth, sensation, and strength in all extremeties. Pt states she has had a pinched nerve before and this feels similar.

## 2014-11-01 NOTE — ED Provider Notes (Signed)
CSN: [id number]     Arrival date & time 11/01/14  1845 History   First MD Initiated Contact with Patient 11/01/14 2121     Chief Complaint  Patient presents with  . Arm Pain    left    (Consider location/radiation/quality/duration/timing/severity/associated sxs/prior Treatment) HPI Victoria Harvey is a 33 yo female presenting with left arm pain x 5 days.  She reports she first noticed it at Thanksgiving but the pain seemed to ease some.  Then today the pain became more bothersome.  She describes it as pain in her left shoulder that hurts down to her fingers.  The pain is constant and sharp and she rates it 8/10.  She reports heavy lifting at times for her job but denies any pain in her neck.  She also denies known injury, chest pain, shortness of breath, fevers or chills.    Past Medical History  Diagnosis Date  . Pinched nerve     in back; recently completed course of meds for sxs  . Strep sore throat    History reviewed. No pertinent past surgical history. No family history on file. History  Substance Use Topics  . Smoking status: Current Every Day Smoker -- 0.50 packs/day    Types: Cigarettes  . Smokeless tobacco: Never Used  . Alcohol Use: Yes   OB History    No data available     Review of Systems  Constitutional: Negative for fever.  Eyes: Negative for visual disturbance.  Respiratory: Negative for shortness of breath.   Cardiovascular: Negative for chest pain.  Gastrointestinal: Negative for nausea and vomiting.  Musculoskeletal: Positive for myalgias.  Skin: Negative for rash.  Neurological: Negative for weakness, numbness and headaches.    Allergies  Review of patient's allergies indicates no known allergies.  Home Medications   Prior to Admission medications   Medication Sig Start Date End Date Taking? Authorizing Provider  acetaminophen (TYLENOL) 500 MG tablet Take 1,000 mg by mouth every 6 (six) hours as needed (for pain/toothache).   Yes Historical  Provider, MD  amoxicillin (AMOXIL) 400 MG/5ML suspension Take 6.3 mLs (500 mg total) by mouth 2 (two) times daily. For 10 days. Patient not taking: Reported on 11/01/2014 07/22/14   Margarita Mail, PA-C  amoxicillin (AMOXIL) 500 MG capsule Take 1 capsule (500 mg total) by mouth 3 (three) times daily. Patient not taking: Reported on 11/01/2014 08/20/14   Elmyra Ricks Pisciotta, PA-C  HYDROcodone-acetaminophen (NORCO) 5-325 MG per tablet Take 1 tablet by mouth every 6 (six) hours as needed. Patient not taking: Reported on 11/01/2014 07/22/14   Margarita Mail, PA-C  HYDROcodone-acetaminophen (NORCO/VICODIN) 5-325 MG per tablet Take 1-2 tablets by mouth every 6 hours as needed for pain. Patient not taking: Reported on 11/01/2014 08/20/14   Elmyra Ricks Pisciotta, PA-C   BP 143/88 mmHg  Pulse 73  Temp(Src) 97.6 F (36.4 C) (Oral)  Resp 18  SpO2 99%  LMP 10/18/2014 Physical Exam  Constitutional: She is oriented to person, place, and time. She appears well-developed and well-nourished. No distress.  HENT:  Head: Normocephalic and atraumatic.  Eyes: Conjunctivae are normal.  Neck: Normal range of motion. Neck supple.  Cardiovascular: Normal rate, regular rhythm and intact distal pulses.   Pulmonary/Chest: Effort normal and breath sounds normal. No respiratory distress.  Abdominal: Soft. There is no tenderness.  Musculoskeletal: She exhibits tenderness.       Left shoulder: She exhibits tenderness. She exhibits normal range of motion and no bony tenderness.  Arms: Reproducible pain on palpation to left trapezius  Neurological: She is alert and oriented to person, place, and time. She has normal strength. No cranial nerve deficit or sensory deficit. Coordination normal. GCS eye subscore is 4. GCS verbal subscore is 5. GCS motor subscore is 6.  5/5 strength with flexion extension and grips bilat upper extremities  Skin: Skin is warm and dry. No rash noted. She is not diaphoretic.  Psychiatric: She has a  normal mood and affect.  Nursing note and vitals reviewed.   ED Course  Procedures (including critical care time) Labs Review Labs Reviewed - No data to display  Imaging Review No results found.   EKG Interpretation None      MDM   Final diagnoses:  Trapezius muscle strain, left, initial encounter   33 yo with TTP to left trapezius which recreates complaint of left arm pain.  Pain managed in ED. Pt advised to follow up with orthopedics if symptoms persist. Conservative therapy recommended and discussed, includign use of NSAIDs and muscle relaxer. Pt is well-appearing, in no acute distress and vital signs are stable.  They appear safe to be discharged.   Return precautions provided.  Patient will be dc home & is agreeable with above plan.  Filed Vitals:   11/01/14 1914 11/01/14 2144  BP: 151/92 143/88  Pulse: 90 73  Temp: 97.6 F (36.4 C)   TempSrc: Oral   Resp: 18 18  SpO2: 97% 99%   Meds given in ED:  Medications - No data to display  New Prescriptions   No medications on file       Britt Bottom, NP 11/02/14 [place], MD 11/04/14 2345

## 2014-12-02 NOTE — L&D Delivery Note (Signed)
Delivery Note At 10:10 PM a viable female was delivered via  (Presentation: ;  ).  APGAR: , ; weight  .   Placenta status: , .  Cord:  with the following complications: .  Cord pH: not done  Anesthesia:   Episiotomy:   Lacerations:   Suture Repair: 2.0 Est. Blood Loss (mL):    Mom to postpartum.  Baby to Couplet care / Skin to Skin.  MARSHALL,BERNARD A 08/19/2015, 10:16 PM

## 2014-12-11 ENCOUNTER — Encounter (HOSPITAL_COMMUNITY): Payer: Self-pay | Admitting: Emergency Medicine

## 2014-12-11 ENCOUNTER — Emergency Department (HOSPITAL_COMMUNITY)
Admission: EM | Admit: 2014-12-11 | Discharge: 2014-12-11 | Disposition: A | Discharge status: Home / Self Care | Payer: No Typology Code available for payment source | Attending: Emergency Medicine | Admitting: Emergency Medicine

## 2014-12-11 DIAGNOSIS — K0889 Other specified disorders of teeth and supporting structures: Secondary | ICD-10-CM

## 2014-12-11 DIAGNOSIS — Z791 Long term (current) use of non-steroidal anti-inflammatories (NSAID): Secondary | ICD-10-CM | POA: Insufficient documentation

## 2014-12-11 DIAGNOSIS — K088 Other specified disorders of teeth and supporting structures: Secondary | ICD-10-CM | POA: Insufficient documentation

## 2014-12-11 DIAGNOSIS — H9209 Otalgia, unspecified ear: Secondary | ICD-10-CM | POA: Diagnosis not present

## 2014-12-11 DIAGNOSIS — Z79899 Other long term (current) drug therapy: Secondary | ICD-10-CM | POA: Insufficient documentation

## 2014-12-11 DIAGNOSIS — Z720 Tobacco use: Secondary | ICD-10-CM | POA: Insufficient documentation

## 2014-12-11 DIAGNOSIS — K029 Dental caries, unspecified: Secondary | ICD-10-CM | POA: Insufficient documentation

## 2014-12-11 MED ORDER — HYDROCODONE-ACETAMINOPHEN 5-325 MG PO TABS: 1.0000 | ORAL_TABLET | Freq: Four times a day (QID) | ORAL | Status: DC | PRN

## 2014-12-11 MED ORDER — PENICILLIN V POTASSIUM 500 MG PO TABS: 500.0000 mg | ORAL_TABLET | Freq: Four times a day (QID) | ORAL | Status: AC

## 2014-12-11 MED ORDER — LIDOCAINE VISCOUS 2 % MT SOLN
15.0000 mL | Freq: Once | OROMUCOSAL | Status: AC
  Administered 2014-12-11: 15 mL via OROMUCOSAL
  Filled 2014-12-11: qty 15

## 2014-12-11 MED ORDER — OXYCODONE-ACETAMINOPHEN 5-325 MG PO TABS
1.0000 | ORAL_TABLET | Freq: Once | ORAL | Status: AC
  Administered 2014-12-11: 1 via ORAL
  Filled 2014-12-11: qty 1

## 2014-12-11 NOTE — ED Notes (Signed)
Pt. Stated, i have a severe toothache started last night.

## 2014-12-11 NOTE — ED Notes (Signed)
Declined W/C at D/C and was escorted to lobby by RN. 

## 2014-12-11 NOTE — ED Provider Notes (Signed)
CSN: [id number]     Arrival date & time 12/11/14  8416 History  This chart was scribed for a non-physician practitioner, Carrie Mew, PA-C working with Dorie Rank, MD by Martinique Peace, ED Scribe. The patient was seen in [facility]. The patient's care was started at 9:48 AM.     Chief Complaint  Patient presents with  . Dental Pain      Patient is a 34 y.o. female presenting with tooth pain. The history is provided by the patient. No language interpreter was used.  Dental Pain Associated symptoms: no fever   HPI Comments: Victoria Harvey is a 34 y.o. female who presents to the Emergency Department complaining of recurring dental pain onset last night specifically to lower lower aspect of mouth. Pt reports she has a broken tooth that has been giving her problems.  No complaints of trouble swallowing. History of dental abscesses. Patient denies nausea, vomiting, fever, oral swelling, dysphagia, sore throat, difficulty breathing.  Past Medical History  Diagnosis Date  . Pinched nerve     in back; recently completed course of meds for sxs  . Strep sore throat    History reviewed. No pertinent past surgical history. No family history on file. History  Substance Use Topics  . Smoking status: Current Every Day Smoker -- 0.50 packs/day    Types: Cigarettes  . Smokeless tobacco: Never Used  . Alcohol Use: Yes   OB History    No data available     Review of Systems  Constitutional: Negative for fever.  HENT: Positive for dental problem and ear pain. Negative for sore throat, trouble swallowing and voice change.   Respiratory: Negative for shortness of breath.       Allergies  Review of patient's allergies indicates no known allergies.  Home Medications   Prior to Admission medications   Medication Sig Start Date End Date Taking? Authorizing Provider  acetaminophen (TYLENOL) 500 MG tablet Take 1,000 mg by mouth every 6 (six) hours as needed (for pain/toothache).    Historical  Provider, MD  amoxicillin (AMOXIL) 400 MG/5ML suspension Take 6.3 mLs (500 mg total) by mouth 2 (two) times daily. For 10 days. Patient not taking: Reported on 11/01/2014 07/22/14   Margarita Mail, PA-C  amoxicillin (AMOXIL) 500 MG capsule Take 1 capsule (500 mg total) by mouth 3 (three) times daily. Patient not taking: Reported on 11/01/2014 08/20/14   Elmyra Ricks Pisciotta, PA-C  cyclobenzaprine (FLEXERIL) 10 MG tablet Take 1 tablet (10 mg total) by mouth 2 (two) times daily as needed for muscle spasms. 11/01/14   Britt Bottom, NP  HYDROcodone-acetaminophen (NORCO/VICODIN) 5-325 MG per tablet Take 1-2 tablets by mouth every 6 (six) hours as needed for moderate pain or severe pain. 12/11/14   Carrie Mew, PA-C  naproxen (NAPROSYN) 500 MG tablet Take 1 tablet (500 mg total) by mouth 2 (two) times daily. 11/01/14   Britt Bottom, NP  penicillin v potassium (VEETID) 500 MG tablet Take 1 tablet (500 mg total) by mouth 4 (four) times daily. 12/11/14 12/18/14  Carrie Mew, PA-C   BP 139/84 mmHg  Pulse 87  Temp(Src) 98.3 F (36.8 C) (Oral)  Resp 20  SpO2 100%  LMP 11/17/2014 Physical Exam  Constitutional: She is oriented to person, place, and time. She appears well-developed and well-nourished. No distress.  HENT:  Head: Normocephalic and atraumatic.  Mouth/Throat: Oropharynx is clear and moist and mucous membranes are normal. No oral lesions. No trismus in the jaw. Dental caries present.  No dental abscesses or uvula swelling. No oropharyngeal exudate, posterior oropharyngeal edema, posterior oropharyngeal erythema or tonsillar abscesses.  Obvious dental caries and broken right lower rear molar. No tonsillar swelling. Floor of mouth is soft, gum line intact without swelling or erythema area no obvious discharge, no gross abscess. Mild anterior cervical lymphadenopathy, no signs of cellulitis intra-or extraorally.  Eyes: Conjunctivae and EOM are normal.  Neck: Neck supple. No tracheal deviation  present.  Cardiovascular: Normal rate.   Pulmonary/Chest: Effort normal. No respiratory distress.  Musculoskeletal: Normal range of motion.  Neurological: She is alert and oriented to person, place, and time.  Skin: Skin is warm and dry.  Psychiatric: She has a normal mood and affect. Her behavior is normal.  Nursing note and vitals reviewed.   ED Course  Procedures (including critical care time) Labs Review Labs Reviewed - No data to display  Imaging Review No results found.   EKG Interpretation None     Medications  lidocaine (XYLOCAINE) 2 % viscous mouth solution 15 mL (not administered)  oxyCODONE-acetaminophen (PERCOCET/ROXICET) 5-325 MG per tablet 1 tablet (1 tablet Oral Given 12/11/14 0924)   9:51 AM- Treatment plan was discussed with patient who verbalizes understanding and agrees.   MDM   Final diagnoses:  Pain, dental   Patient with toothache.  No gross abscess.  Exam unconcerning for Ludwig's angina or spread of infection.  Will treat with penicillin and pain medicine.  Patient given viscous lidocaine in the ED for the local anesthetic for her toothache. Urged patient to follow-up with dentist.  I discussed return precautions with patient, and patient verbalized understanding and agreement of this plan. I encouraged her to call or return to the ER should she have any questions or concerns.  I personally performed the services described in this documentation, which was scribed in my presence. The recorded information has been reviewed and is accurate.  BP 139/84 mmHg  Pulse 87  Temp(Src) 98.3 F (36.8 C) (Oral)  Resp 20  SpO2 100%  LMP 11/17/2014  Signed,  Dahlia Bailiff, PA-C 9:57 AM   Carrie Mew, PA-C 12/11/14 7588  Dorie Rank, MD 12/12/14 [phone]

## 2014-12-11 NOTE — Discharge Instructions (Signed)
Follow-up with dentistry. Return to the ER if any high fever, oral swelling, worsening of symptoms, nausea, vomiting or purulent discharge.  Dental Pain A tooth ache may be caused by cavities (tooth decay). Cavities expose the nerve of the tooth to air and hot or cold temperatures. It may come from an infection or abscess (also called a boil or furuncle) around your tooth. It is also often caused by dental caries (tooth decay). This causes the pain you are having. DIAGNOSIS  Your caregiver can diagnose this problem by exam. TREATMENT   If caused by an infection, it may be treated with medications which kill germs (antibiotics) and pain medications as prescribed by your caregiver. Take medications as directed.  Only take over-the-counter or prescription medicines for pain, discomfort, or fever as directed by your caregiver.  Whether the tooth ache today is caused by infection or dental disease, you should see your dentist as soon as possible for further care. SEEK MEDICAL CARE IF: The exam and treatment you received today has been provided on an emergency basis only. This is not a substitute for complete medical or dental care. If your problem worsens or new problems (symptoms) appear, and you are unable to meet with your dentist, call or return to this location. SEEK IMMEDIATE MEDICAL CARE IF:   You have a fever.  You develop redness and swelling of your face, jaw, or neck.  You are unable to open your mouth.  You have severe pain uncontrolled by pain medicine. MAKE SURE YOU:   Understand these instructions.  Will watch your condition.  Will get help right away if you are not doing well or get worse. Document Released: 11/18/2005 Document Revised: 02/10/2012 Document Reviewed: 07/06/2008 [facility] Patient Information 2015 [place], [place]. This information is not intended to replace advice given to you by your health care provider. Make sure you discuss any questions you have with your  health care provider.

## 2015-01-25 ENCOUNTER — Inpatient Hospital Stay (HOSPITAL_COMMUNITY): Payer: No Typology Code available for payment source

## 2015-01-25 ENCOUNTER — Encounter (HOSPITAL_COMMUNITY): Payer: Self-pay | Admitting: *Deleted

## 2015-01-25 ENCOUNTER — Inpatient Hospital Stay (HOSPITAL_COMMUNITY)
Admission: AD | Admit: 2015-01-25 | Discharge: 2015-01-25 | Disposition: A | Discharge status: Home / Self Care | Payer: No Typology Code available for payment source | Source: Ambulatory Visit | Attending: Obstetrics and Gynecology | Admitting: Obstetrics and Gynecology

## 2015-01-25 DIAGNOSIS — R102 Pelvic and perineal pain: Secondary | ICD-10-CM | POA: Diagnosis not present

## 2015-01-25 DIAGNOSIS — O9989 Other specified diseases and conditions complicating pregnancy, childbirth and the puerperium: Secondary | ICD-10-CM

## 2015-01-25 DIAGNOSIS — R109 Unspecified abdominal pain: Secondary | ICD-10-CM

## 2015-01-25 DIAGNOSIS — Z3A10 10 weeks gestation of pregnancy: Secondary | ICD-10-CM | POA: Insufficient documentation

## 2015-01-25 DIAGNOSIS — O26891 Other specified pregnancy related conditions, first trimester: Secondary | ICD-10-CM | POA: Insufficient documentation

## 2015-01-25 DIAGNOSIS — O26899 Other specified pregnancy related conditions, unspecified trimester: Secondary | ICD-10-CM

## 2015-01-25 DIAGNOSIS — Z87891 Personal history of nicotine dependence: Secondary | ICD-10-CM | POA: Diagnosis not present

## 2015-01-25 DIAGNOSIS — Z3A11 11 weeks gestation of pregnancy: Secondary | ICD-10-CM

## 2015-01-25 DIAGNOSIS — R1031 Right lower quadrant pain: Secondary | ICD-10-CM | POA: Diagnosis present

## 2015-01-25 LAB — URINE MICROSCOPIC-ADD ON

## 2015-01-25 LAB — CBC
HCT: 35.2 % — ABNORMAL LOW (ref 36.0–46.0)
Hemoglobin: 11.9 g/dL — ABNORMAL LOW (ref 12.0–15.0)
MCH: 30.7 pg (ref 26.0–34.0)
MCHC: 33.8 g/dL (ref 30.0–36.0)
MCV: 90.7 fL (ref 78.0–100.0)
Platelets: 331 10*3/uL (ref 150–400)
RBC: 3.88 MIL/uL (ref 3.87–5.11)
RDW: 14.6 % (ref 11.5–15.5)
WBC: 9.2 10*3/uL (ref 4.0–10.5)

## 2015-01-25 LAB — URINALYSIS, ROUTINE W REFLEX MICROSCOPIC
Bilirubin Urine: NEGATIVE
Glucose, UA: NEGATIVE mg/dL
Ketones, ur: 15 mg/dL — AB
Nitrite: NEGATIVE
Protein, ur: NEGATIVE mg/dL
Specific Gravity, Urine: 1.02 (ref 1.005–1.030)
Urobilinogen, UA: 1 mg/dL (ref 0.0–1.0)
pH: 7 (ref 5.0–8.0)

## 2015-01-25 NOTE — Discharge Instructions (Signed)

## 2015-01-25 NOTE — MAU Note (Signed)
Sharp RLQ pain x 2 days, pos HPT several weeks ago, has started [facility], [facility] 9/15.  Denies bleeding.

## 2015-01-25 NOTE — MAU Provider Note (Signed)
History     CSN: [id number]  Arrival date and time: 01/25/15 1131   None     Chief Complaint  Patient presents with  . Abdominal Pain   HPI  34 y.o.G2P1001@[redacted]w[redacted]d  presents to the MAU with the complaint of RLQ pain x 2 days. Denies fever, nausea and vomiting. Denies vaginal bleeding.     Past Medical History  Diagnosis Date  . Pinched nerve     in back; recently completed course of meds for sxs  . Strep sore throat   . Medical history non-contributory     Past Surgical History  Procedure Laterality Date  . No past surgeries      History reviewed. No pertinent family history.  History  Substance Use Topics  . Smoking status: Former Smoker -- 0.50 packs/day    Types: Cigarettes    Quit date: 11/24/2014  . Smokeless tobacco: Never Used  . Alcohol Use: No    Allergies: No Known Allergies  Prescriptions prior to admission  Medication Sig Dispense Refill Last Dose  . amoxicillin (AMOXIL) 400 MG/5ML suspension Take 6.3 mLs (500 mg total) by mouth 2 (two) times daily. For 10 days. (Patient not taking: Reported on 01/25/2015) 200 mL 0   . amoxicillin (AMOXIL) 500 MG capsule Take 1 capsule (500 mg total) by mouth 3 (three) times daily. (Patient not taking: Reported on 01/25/2015) 30 capsule 0   . cyclobenzaprine (FLEXERIL) 10 MG tablet Take 1 tablet (10 mg total) by mouth 2 (two) times daily as needed for muscle spasms. (Patient not taking: Reported on 01/25/2015) 20 tablet 0   . HYDROcodone-acetaminophen (NORCO/VICODIN) 5-325 MG per tablet Take 1-2 tablets by mouth every 6 (six) hours as needed for moderate pain or severe pain. (Patient not taking: Reported on 01/25/2015) 10 tablet 0   . naproxen (NAPROSYN) 500 MG tablet Take 1 tablet (500 mg total) by mouth 2 (two) times daily. (Patient not taking: Reported on 01/25/2015) 30 tablet 0     Review of Systems  Constitutional: Negative for fever and chills.  Respiratory: Negative for cough and shortness of breath.    Cardiovascular: Negative for chest pain.  Gastrointestinal: Positive for abdominal pain and constipation. Negative for nausea, vomiting and diarrhea.  Genitourinary: Negative for dysuria, urgency and frequency.  Musculoskeletal: Negative for back pain and neck pain.  Neurological: Negative for dizziness and weakness.  Psychiatric/Behavioral: Negative.    Physical Exam   Blood pressure 140/61, resp. rate 16, height 5\' 7"  (1.702 m), weight 76.658 kg (169 lb), last menstrual period 11/17/2014, SpO2 100 %.  FHT's obtained with doppler 160  Physical Exam  Constitutional: She is oriented to person, place, and time. She appears well-developed and well-nourished. No distress.  HENT:  Head: Normocephalic and atraumatic.  Neck: Normal range of motion.  Cardiovascular: Normal rate.   Respiratory: Effort normal. No respiratory distress.  GI: Soft. She exhibits no distension and no mass. There is tenderness. There is no rebound and no guarding.  RLQ  Musculoskeletal: Normal range of motion.  Neurological: She is alert and oriented to person, place, and time.  Skin: Skin is warm and dry.  Psychiatric: She has a normal mood and affect. Her behavior is normal. Judgment and thought content normal.    MAU Course  Procedures  CBC ABO Ultrasound Results for orders placed or performed during the hospital encounter of 01/25/15 (from the past 24 hour(s))  Urinalysis, Routine w reflex microscopic     Status: Abnormal   Collection Time: 01/25/15  12:15 PM  Result Value Ref Range   Color, Urine YELLOW YELLOW   APPearance CLEAR CLEAR   Specific Gravity, Urine 1.020 1.005 - 1.030   pH 7.0 5.0 - 8.0   Glucose, UA NEGATIVE NEGATIVE mg/dL   Hgb urine dipstick MODERATE (A) NEGATIVE   Bilirubin Urine NEGATIVE NEGATIVE   Ketones, ur 15 (A) NEGATIVE mg/dL   Protein, ur NEGATIVE NEGATIVE mg/dL   Urobilinogen, UA 1.0 0.0 - 1.0 mg/dL   Nitrite NEGATIVE NEGATIVE   Leukocytes, UA TRACE (A) NEGATIVE  Urine  microscopic-add on     Status: Abnormal   Collection Time: 01/25/15 12:15 PM  Result Value Ref Range   Squamous Epithelial / LPF FEW (A) RARE   WBC, UA 7-10 <3 WBC/hpf   RBC / HPF 3-6 <3 RBC/hpf   Bacteria, UA FEW (A) RARE  CBC     Status: Abnormal   Collection Time: 01/25/15  1:47 PM  Result Value Ref Range   WBC 9.2 4.0 - 10.5 K/uL   RBC 3.88 3.87 - 5.11 MIL/uL   Hemoglobin 11.9 (L) 12.0 - 15.0 g/dL   HCT 35.2 (L) 36.0 - 46.0 %   MCV 90.7 78.0 - 100.0 fL   MCH 30.7 26.0 - 34.0 pg   MCHC 33.8 30.0 - 36.0 g/dL   RDW 14.6 11.5 - 15.5 %   Platelets 331 150 - 400 K/uL     Assessment and Plan  Abdominal Pain in pregnancy Round ligament Pain   Consulted with Dr Helane Rima and pt will be discharged RTC at regularly scheduled appointment  [facility] [place] 01/25/2015, 1:44 PM

## 2015-02-16 LAB — OB RESULTS CONSOLE GC/CHLAMYDIA
Chlamydia: NEGATIVE
Gonorrhea: NEGATIVE

## 2015-02-16 LAB — OB RESULTS CONSOLE RUBELLA ANTIBODY, IGM: Rubella: IMMUNE

## 2015-02-16 LAB — OB RESULTS CONSOLE ANTIBODY SCREEN: Antibody Screen: NEGATIVE

## 2015-02-16 LAB — OB RESULTS CONSOLE HEPATITIS B SURFACE ANTIGEN: Hepatitis B Surface Ag: NEGATIVE

## 2015-02-16 LAB — OB RESULTS CONSOLE ABO/RH: RH Type: POSITIVE

## 2015-02-16 LAB — OB RESULTS CONSOLE HIV ANTIBODY (ROUTINE TESTING): HIV: NONREACTIVE

## 2015-02-16 LAB — PROCEDURE REPORT - SCANNED: Pap: NEGATIVE

## 2015-02-16 LAB — OB RESULTS CONSOLE RPR: RPR: NONREACTIVE

## 2015-03-08 ENCOUNTER — Inpatient Hospital Stay (HOSPITAL_COMMUNITY)
Admission: AD | Admit: 2015-03-08 | Discharge: 2015-03-08 | Disposition: A | Discharge status: Home / Self Care | Payer: No Typology Code available for payment source | Source: Ambulatory Visit | Attending: Obstetrics | Admitting: Obstetrics

## 2015-03-08 ENCOUNTER — Encounter (HOSPITAL_COMMUNITY): Payer: Self-pay | Admitting: *Deleted

## 2015-03-08 DIAGNOSIS — W19XXXA Unspecified fall, initial encounter: Secondary | ICD-10-CM

## 2015-03-08 DIAGNOSIS — O9A212 Injury, poisoning and certain other consequences of external causes complicating pregnancy, second trimester: Secondary | ICD-10-CM

## 2015-03-08 DIAGNOSIS — S7001XA Contusion of right hip, initial encounter: Secondary | ICD-10-CM | POA: Diagnosis not present

## 2015-03-08 DIAGNOSIS — Z3A16 16 weeks gestation of pregnancy: Secondary | ICD-10-CM | POA: Diagnosis not present

## 2015-03-08 DIAGNOSIS — O9989 Other specified diseases and conditions complicating pregnancy, childbirth and the puerperium: Secondary | ICD-10-CM | POA: Diagnosis present

## 2015-03-08 DIAGNOSIS — W010XXA Fall on same level from slipping, tripping and stumbling without subsequent striking against object, initial encounter: Secondary | ICD-10-CM | POA: Diagnosis not present

## 2015-03-08 DIAGNOSIS — S3991XA Unspecified injury of abdomen, initial encounter: Secondary | ICD-10-CM | POA: Diagnosis not present

## 2015-03-08 DIAGNOSIS — O26899 Other specified pregnancy related conditions, unspecified trimester: Secondary | ICD-10-CM

## 2015-03-08 DIAGNOSIS — Z87891 Personal history of nicotine dependence: Secondary | ICD-10-CM | POA: Insufficient documentation

## 2015-03-08 DIAGNOSIS — R109 Unspecified abdominal pain: Secondary | ICD-10-CM

## 2015-03-08 LAB — URINALYSIS, ROUTINE W REFLEX MICROSCOPIC
Bilirubin Urine: NEGATIVE
Glucose, UA: NEGATIVE mg/dL
Ketones, ur: NEGATIVE mg/dL
Leukocytes, UA: NEGATIVE
Nitrite: NEGATIVE
Protein, ur: NEGATIVE mg/dL
Specific Gravity, Urine: 1.015 (ref 1.005–1.030)
Urobilinogen, UA: 1 mg/dL (ref 0.0–1.0)
pH: 6 (ref 5.0–8.0)

## 2015-03-08 LAB — URINE MICROSCOPIC-ADD ON

## 2015-03-08 MED ORDER — ACETAMINOPHEN-CODEINE #3 300-30 MG PO TABS: 1.0000 | ORAL_TABLET | Freq: Four times a day (QID) | ORAL | Status: DC | PRN

## 2015-03-08 NOTE — MAU Provider Note (Signed)
History     CSN: [id number]  Arrival date and time: 03/08/15 1840   None     Chief Complaint  Patient presents with  . Fall   Other This is a new problem. The current episode started today. The problem occurs constantly. The problem has been unchanged. Pertinent negatives include no abdominal pain, chills, fever, joint swelling, myalgias or neck pain. Nothing (hurts to touch it, does not hurt to walk) aggravates the symptoms. She has tried nothing for the symptoms.   THis is a 34 y.o. female at [redacted]w[redacted]d who presents with c/o fall at work today. Slipped and landed on her right hip. Did not strike abdomen. Has some pain in the tissue of the hip, no deep pain. Able to bear weight.   RN Note: Slipped and fell at work, landed on rt side. No bleeding but is having pain on that side.          Expand All Collapse All   Pt reports falling at work today at around 1630. Pt reports that she slipped on a crayon and fell on her R side.Denies leaking or bleeding. Pt states shooting/ sharp pain of an 8 on her R side. Pt would like to take some medicine for the pain.          OB History    Gravida Para Term Preterm AB TAB SAB Ectopic Multiple Living   2 1 1       1       Past Medical History  Diagnosis Date  . Pinched nerve     in back; recently completed course of meds for sxs  . Strep sore throat   . Medical history non-contributory     Past Surgical History  Procedure Laterality Date  . No past surgeries      No family history on file.  History  Substance Use Topics  . Smoking status: Former Smoker -- 0.50 packs/day    Types: Cigarettes    Quit date: 11/24/2014  . Smokeless tobacco: Never Used  . Alcohol Use: No    Allergies: No Known Allergies  No prescriptions prior to admission    Review of Systems  Constitutional: Negative for fever, chills and malaise/fatigue.  Cardiovascular: Negative for leg swelling (tender to touch over right iliac crest).   Gastrointestinal: Negative for abdominal pain.  Musculoskeletal: Negative for myalgias, joint swelling and neck pain.  Neurological: Negative for dizziness.   Physical Exam   Blood pressure 125/72, pulse 90, temperature 98.4 F (36.9 C), temperature source Oral, resp. rate 18, height 5\' 4"  (1.626 m), weight 170 lb (77.111 kg), last menstrual period 11/17/2014.  Physical Exam  Constitutional: She is oriented to person, place, and time. She appears well-developed and well-nourished. No distress.  HENT:  Head: Normocephalic.  Cardiovascular: Normal rate.   Respiratory: Effort normal. No respiratory distress.  Genitourinary:  Exam not indicated Did not strike abdomen No bleeding + FHTs  Musculoskeletal: Normal range of motion. She exhibits tenderness (over right iliac crest). She exhibits no edema.  Able to bear weight with walking  Neurological: She is alert and oriented to person, place, and time.  Skin: Skin is warm and dry.  Psychiatric: She has a normal mood and affect.    MAU Course  Procedures  MDM Results for orders placed or performed during the hospital encounter of 03/08/15 (from the past 72 hour(s))  Urinalysis, Routine w reflex microscopic     Status: Abnormal   Collection Time: 03/08/15  7:00  PM  Result Value Ref Range   Color, Urine YELLOW YELLOW   APPearance CLEAR CLEAR   Specific Gravity, Urine 1.015 1.005 - 1.030   pH 6.0 5.0 - 8.0   Glucose, UA NEGATIVE NEGATIVE mg/dL   Hgb urine dipstick TRACE (A) NEGATIVE   Bilirubin Urine NEGATIVE NEGATIVE   Ketones, ur NEGATIVE NEGATIVE mg/dL   Protein, ur NEGATIVE NEGATIVE mg/dL   Urobilinogen, UA 1.0 0.0 - 1.0 mg/dL   Nitrite NEGATIVE NEGATIVE   Leukocytes, UA NEGATIVE NEGATIVE  Urine microscopic-add on     Status: None   Collection Time: 03/08/15  7:00 PM  Result Value Ref Range   Squamous Epithelial / LPF RARE RARE   WBC, UA 0-2 <3 WBC/hpf   RBC / HPF 0-2 <3 RBC/hpf   Bacteria, UA RARE RARE      Assessment and Plan  A:  SIUP at [redacted]w[redacted]d        S/P fall at work       Right hip contusion       Viable fetus  P;  Discussed with Dr Ruthann Cancer       Discharge home       Ice to contusion       Rx Tylenol #3 for PRN use for pain       Followup in Dr Marcheta Grammes office for prenatal care       Call if she develops new pain or diffuculty walking  [facility] 03/08/2015, 6:58 PM

## 2015-03-08 NOTE — MAU Note (Signed)
Pt reports falling at work today at around 1630. Pt reports that she slipped on a crayon and fell on her R side.Denies leaking or bleeding. Pt states shooting/ sharp pain of an 8 on her R side. Pt would like to take some medicine for the pain.

## 2015-03-08 NOTE — MAU Note (Signed)
Slipped and fell at work, landed on rt side. No bleeding but is having pain on that side.

## 2015-03-08 NOTE — Discharge Instructions (Signed)
°Contusion °A contusion is a deep bruise. Contusions are the result of an injury that caused bleeding under the skin. The contusion may turn blue, purple, or yellow. Minor injuries will give you a painless contusion, but more severe contusions may stay painful and swollen for a few weeks.  °CAUSES  °A contusion is usually caused by a blow, trauma, or direct force to an area of the body. °SYMPTOMS  °· Swelling and redness of the injured area. °· Bruising of the injured area. °· Tenderness and soreness of the injured area. °· Pain. °DIAGNOSIS  °The diagnosis can be made by taking a history and physical exam. An X-ray, CT scan, or MRI may be needed to determine if there were any associated injuries, such as fractures. °TREATMENT  °Specific treatment will depend on what area of the body was injured. In general, the best treatment for a contusion is resting, icing, elevating, and applying cold compresses to the injured area. Over-the-counter medicines may also be recommended for pain control. Ask your caregiver what the best treatment is for your contusion. °HOME CARE INSTRUCTIONS  °· Put ice on the injured area. °· Put ice in a plastic bag. °· Place a towel between your skin and the bag. °· Leave the ice on for 15-20 minutes, 3-4 times a day, or as directed by your health care provider. °· Only take over-the-counter or prescription medicines for pain, discomfort, or fever as directed by your caregiver. Your caregiver may recommend avoiding anti-inflammatory medicines (aspirin, ibuprofen, and naproxen) for 48 hours because these medicines may increase bruising. °· Rest the injured area. °· If possible, elevate the injured area to reduce swelling. °SEEK IMMEDIATE MEDICAL CARE IF:  °· You have increased bruising or swelling. °· You have pain that is getting worse. °· Your swelling or pain is not relieved with medicines. °MAKE SURE YOU:  °· Understand these instructions. °· Will watch your condition. °· Will get help  right away if you are not doing well or get worse. °Document Released: 08/28/2005 Document Revised: 11/23/2013 Document Reviewed: 09/23/2011 °[employer]® Patient Information ©2015 [employer]. This information is not intended to replace advice given to you by your health care provider. Make sure you discuss any questions you have with your health care provider. °Fall Prevention and Home Safety °Falls cause injuries and can affect all age groups. It is possible to use preventive measures to significantly decrease the likelihood of falls. There are many simple measures which can make your home safer and prevent falls. °OUTDOORS °· Repair cracks and edges of walkways and driveways. °· Remove high doorway thresholds. °· Trim shrubbery on the main path into your home. °· Have good outside lighting. °· Clear walkways of tools, rocks, debris, and clutter. °· Check that handrails are not broken and are securely fastened. Both sides of steps should have handrails. °· Have leaves, snow, and ice cleared regularly. °· Use sand or salt on walkways during winter months. °· In the garage, clean up grease or oil spills. °BATHROOM °· Install night lights. °· Install grab bars by the toilet and in the tub and shower. °· Use non-skid mats or decals in the tub or shower. °· Place a plastic non-slip stool in the shower to sit on, if needed. °· Keep floors dry and clean up all water on the floor immediately. °· Remove soap buildup in the tub or shower on a regular basis. °· Secure bath mats with non-slip, double-sided rug tape. °· Remove throw rugs and tripping hazards   from the floors. °BEDROOMS °· Install night lights. °· Make sure a bedside light is easy to reach. °· Do not use oversized bedding. °· Keep a telephone by your bedside. °· Have a firm chair with side arms to use for getting dressed. °· Remove throw rugs and tripping hazards from the floor. °KITCHEN °· Keep handles on pots and pans turned toward the center of the stove. Use  back burners when possible. °· Clean up spills quickly and allow time for drying. °· Avoid walking on wet floors. °· Avoid hot utensils and knives. °· Position shelves so they are not too high or low. °· Place commonly used objects within easy reach. °· If necessary, use a sturdy step stool with a grab bar when reaching. °· Keep electrical cables out of the way. °· Do not use floor polish or wax that makes floors slippery. If you must use wax, use non-skid floor wax. °· Remove throw rugs and tripping hazards from the floor. °STAIRWAYS °· Never leave objects on stairs. °· Place handrails on both sides of stairways and use them. Fix any loose handrails. Make sure handrails on both sides of the stairways are as long as the stairs. °· Check carpeting to make sure it is firmly attached along stairs. Make repairs to worn or loose carpet promptly. °· Avoid placing throw rugs at the top or bottom of stairways, or properly secure the rug with carpet tape to prevent slippage. Get rid of throw rugs, if possible. °· Have an electrician put in a light switch at the top and bottom of the stairs. °OTHER FALL PREVENTION TIPS °· Wear low-heel or rubber-soled shoes that are supportive and fit well. Wear closed toe shoes. °· When using a stepladder, make sure it is fully opened and both spreaders are firmly locked. Do not climb a closed stepladder. °· Add color or contrast paint or tape to grab bars and handrails in your home. Place contrasting color strips on first and last steps. °· Learn and use mobility aids as needed. Install an electrical emergency response system. °· Turn on lights to avoid dark areas. Replace light bulbs that burn out immediately. Get light switches that glow. °· Arrange furniture to create clear pathways. Keep furniture in the same place. °· Firmly attach carpet with non-skid or double-sided tape. °· Eliminate uneven floor surfaces. °· Select a carpet pattern that does not visually hide the edge of  steps. °· Be aware of all pets. °OTHER HOME SAFETY TIPS °· Set the water temperature for 120° F (48.8° C). °· Keep emergency numbers on or near the telephone. °· Keep smoke detectors on every level of the home and near sleeping areas. °Document Released: 11/08/2002 Document Revised: 05/19/2012 Document Reviewed: 02/07/2012 °Exit[employer]® Patient Information ©2015 [employer]. This information is not intended to replace advice given to you by your health care provider. Make sure you discuss any questions you have with your health care provider. ° °

## 2015-03-29 ENCOUNTER — Inpatient Hospital Stay (HOSPITAL_COMMUNITY)
Admission: AD | Admit: 2015-03-29 | Discharge: 2015-03-29 | Disposition: A | Discharge status: Home / Self Care | Payer: No Typology Code available for payment source | Source: Ambulatory Visit | Attending: Obstetrics | Admitting: Obstetrics

## 2015-03-29 ENCOUNTER — Encounter (HOSPITAL_COMMUNITY): Payer: Self-pay | Admitting: *Deleted

## 2015-03-29 DIAGNOSIS — M549 Dorsalgia, unspecified: Secondary | ICD-10-CM

## 2015-03-29 DIAGNOSIS — O9989 Other specified diseases and conditions complicating pregnancy, childbirth and the puerperium: Secondary | ICD-10-CM | POA: Diagnosis not present

## 2015-03-29 DIAGNOSIS — O26892 Other specified pregnancy related conditions, second trimester: Secondary | ICD-10-CM | POA: Diagnosis not present

## 2015-03-29 DIAGNOSIS — O26899 Other specified pregnancy related conditions, unspecified trimester: Secondary | ICD-10-CM

## 2015-03-29 DIAGNOSIS — R1030 Lower abdominal pain, unspecified: Secondary | ICD-10-CM | POA: Insufficient documentation

## 2015-03-29 DIAGNOSIS — R102 Pelvic and perineal pain: Secondary | ICD-10-CM | POA: Diagnosis not present

## 2015-03-29 DIAGNOSIS — Z87891 Personal history of nicotine dependence: Secondary | ICD-10-CM | POA: Insufficient documentation

## 2015-03-29 DIAGNOSIS — Z3A19 19 weeks gestation of pregnancy: Secondary | ICD-10-CM | POA: Diagnosis not present

## 2015-03-29 DIAGNOSIS — O99891 Other specified diseases and conditions complicating pregnancy: Secondary | ICD-10-CM

## 2015-03-29 LAB — URINE MICROSCOPIC-ADD ON

## 2015-03-29 LAB — URINALYSIS, ROUTINE W REFLEX MICROSCOPIC
Bilirubin Urine: NEGATIVE
Glucose, UA: NEGATIVE mg/dL
Ketones, ur: 15 mg/dL — AB
Leukocytes, UA: NEGATIVE
Nitrite: NEGATIVE
Protein, ur: NEGATIVE mg/dL
Specific Gravity, Urine: 1.025 (ref 1.005–1.030)
Urobilinogen, UA: 0.2 mg/dL (ref 0.0–1.0)
pH: 6 (ref 5.0–8.0)

## 2015-03-29 LAB — WET PREP, GENITAL
Trich, Wet Prep: NONE SEEN
Yeast Wet Prep HPF POC: NONE SEEN

## 2015-03-29 MED ORDER — CYCLOBENZAPRINE HCL 10 MG PO TABS: 10.0000 mg | ORAL_TABLET | Freq: Three times a day (TID) | ORAL | Status: DC | PRN

## 2015-03-29 NOTE — MAU Note (Signed)
Pt reports lower back pain and lower abd cramping x 3 days. Denies bleeding.denies nausea, vomiting, fever or dysuria.

## 2015-03-29 NOTE — MAU Provider Note (Signed)
History     CSN: [id number]  Arrival date and time: 03/29/15 4098   First Provider Initiated Contact with Patient 03/29/15 2017      No chief complaint on file.  HPI Comments: Victoria Harvey is a 33 y.o. G2P1001 at [redacted]w[redacted]d who presents today with lower abdominal pain, and back pain x 3 days. She denies any vaginal bleeding or LOF. She confirms fetal movement.   Abdominal Pain This is a new problem. The current episode started in the past 7 days. The onset quality is gradual. The problem occurs constantly. The problem has been unchanged. The pain is located in the suprapubic region. The pain is at a severity of 8/10. The quality of the pain is cramping and aching. The abdominal pain radiates to the back. Associated symptoms include nausea (she has had nausea throughout the pregnancy ). Pertinent negatives include no constipation (last BM 03/27/16 ), diarrhea, dysuria, fever, frequency or vomiting. Exacerbated by: standing  The pain is relieved by nothing. She has tried acetaminophen (took 1000 mg about 2 hours ago. ) for the symptoms. The treatment provided no relief.   Past Medical History  Diagnosis Date  . Pinched nerve     in back; recently completed course of meds for sxs  . Strep sore throat     Past Surgical History  Procedure Laterality Date  . No past surgeries      Family History  Problem Relation Age of Onset  . Hypertension Mother   . Hypertension Father     History  Substance Use Topics  . Smoking status: Former Smoker -- 0.50 packs/day    Types: Cigarettes    Quit date: 11/24/2014  . Smokeless tobacco: Never Used  . Alcohol Use: No    Allergies: No Known Allergies  Prescriptions prior to admission  Medication Sig Dispense Refill Last Dose  . Prenatal Vit-Fe Fumarate-FA (PRENATAL MULTIVITAMIN) TABS tablet Take 1 tablet by mouth daily at 12 noon.   03/29/2015 at Unknown time  . acetaminophen-codeine (TYLENOL #3) 300-30 MG per tablet Take 1-2 tablets by mouth  every 6 (six) hours as needed for moderate pain. 15 tablet 0 More than a month at Unknown time  . Doxylamine-Pyridoxine 10-10 MG TBEC Take 2 tablets by mouth at bedtime.   Unknown at Unknown time    Review of Systems  Constitutional: Negative for fever.  Gastrointestinal: Positive for nausea (she has had nausea throughout the pregnancy ) and abdominal pain. Negative for vomiting, diarrhea and constipation (last BM 03/27/16 ).  Genitourinary: Negative for dysuria, urgency and frequency.   Physical Exam   Blood pressure 123/70, pulse 81, temperature 98.2 F (36.8 C), temperature source Oral, resp. rate 18, height 5\' 5"  (1.651 m), weight 77.565 kg (171 lb), last menstrual period 11/17/2014, SpO2 100 %.  Physical Exam  Nursing note and vitals reviewed. Constitutional: She is oriented to person, place, and time. She appears well-developed and well-nourished. No distress.  Cardiovascular: Normal rate.   Respiratory: Effort normal.  GI: Soft. There is no tenderness. There is no rebound.  Genitourinary:   External: no lesion Vagina: small amount of white discharge Cervix: pink, smooth, closed/thick/high  Uterus: AGA    Neurological: She is alert and oriented to person, place, and time.  Skin: Skin is warm and dry.  Psychiatric: She has a normal mood and affect.   Results for orders placed or performed during the hospital encounter of 03/29/15 (from the past 24 hour(s))  Urinalysis, Routine w reflex microscopic  Status: Abnormal   Collection Time: 03/29/15  7:29 PM  Result Value Ref Range   Color, Urine YELLOW YELLOW   APPearance CLEAR CLEAR   Specific Gravity, Urine 1.025 1.005 - 1.030   pH 6.0 5.0 - 8.0   Glucose, UA NEGATIVE NEGATIVE mg/dL   Hgb urine dipstick MODERATE (A) NEGATIVE   Bilirubin Urine NEGATIVE NEGATIVE   Ketones, ur 15 (A) NEGATIVE mg/dL   Protein, ur NEGATIVE NEGATIVE mg/dL   Urobilinogen, UA 0.2 0.0 - 1.0 mg/dL   Nitrite NEGATIVE NEGATIVE   Leukocytes, UA  NEGATIVE NEGATIVE  Urine microscopic-add on     Status: None   Collection Time: 03/29/15  7:29 PM  Result Value Ref Range   Squamous Epithelial / LPF RARE RARE   WBC, UA 0-2 <3 WBC/hpf   RBC / HPF 0-2 <3 RBC/hpf   Bacteria, UA RARE RARE  Wet prep, genital     Status: Abnormal   Collection Time: 03/29/15  8:25 PM  Result Value Ref Range   Yeast Wet Prep HPF POC NONE SEEN NONE SEEN   Trich, Wet Prep NONE SEEN NONE SEEN   Clue Cells Wet Prep HPF POC FEW (A) NONE SEEN   WBC, Wet Prep HPF POC FEW (A) NONE SEEN   MAU Course  Procedures  MDM   Assessment and Plan   1. Back pain affecting pregnancy in second trimester   2. Pain of round ligament affecting pregnancy, antepartum    DC home RX flexeril #30 Comfort measures reviewed Return to MAU as needed  Follow-up Information    Follow up with Frederico Hamman, MD.   Specialty:  Obstetrics and Gynecology   Why:  As scheduled   Contact information:   [address] [place] [address] [phone]        Mathis Bud 03/29/2015, 8:19 PM

## 2015-03-29 NOTE — Discharge Instructions (Signed)

## 2015-04-24 ENCOUNTER — Encounter (HOSPITAL_COMMUNITY): Payer: Self-pay | Admitting: *Deleted

## 2015-04-24 ENCOUNTER — Inpatient Hospital Stay (HOSPITAL_COMMUNITY)
Admission: AD | Admit: 2015-04-24 | Discharge: 2015-04-24 | Disposition: A | Discharge status: Home / Self Care | Payer: No Typology Code available for payment source | Source: Ambulatory Visit | Attending: Obstetrics | Admitting: Obstetrics

## 2015-04-24 DIAGNOSIS — O4700 False labor before 37 completed weeks of gestation, unspecified trimester: Secondary | ICD-10-CM | POA: Insufficient documentation

## 2015-04-24 DIAGNOSIS — Z3A23 23 weeks gestation of pregnancy: Secondary | ICD-10-CM | POA: Diagnosis not present

## 2015-04-24 DIAGNOSIS — Z87891 Personal history of nicotine dependence: Secondary | ICD-10-CM | POA: Diagnosis not present

## 2015-04-24 DIAGNOSIS — O9989 Other specified diseases and conditions complicating pregnancy, childbirth and the puerperium: Secondary | ICD-10-CM | POA: Diagnosis not present

## 2015-04-24 DIAGNOSIS — O26899 Other specified pregnancy related conditions, unspecified trimester: Secondary | ICD-10-CM

## 2015-04-24 DIAGNOSIS — Z886 Allergy status to analgesic agent status: Secondary | ICD-10-CM | POA: Insufficient documentation

## 2015-04-24 DIAGNOSIS — R109 Unspecified abdominal pain: Secondary | ICD-10-CM | POA: Diagnosis not present

## 2015-04-24 DIAGNOSIS — O4702 False labor before 37 completed weeks of gestation, second trimester: Secondary | ICD-10-CM

## 2015-04-24 DIAGNOSIS — R102 Pelvic and perineal pain: Secondary | ICD-10-CM

## 2015-04-24 LAB — URINALYSIS, ROUTINE W REFLEX MICROSCOPIC
Bilirubin Urine: NEGATIVE
Glucose, UA: NEGATIVE mg/dL
Ketones, ur: 40 mg/dL — AB
Leukocytes, UA: NEGATIVE
Nitrite: NEGATIVE
Protein, ur: NEGATIVE mg/dL
Specific Gravity, Urine: 1.02 (ref 1.005–1.030)
Urobilinogen, UA: 1 mg/dL (ref 0.0–1.0)
pH: 6.5 (ref 5.0–8.0)

## 2015-04-24 LAB — URINE MICROSCOPIC-ADD ON

## 2015-04-24 MED ORDER — IBUPROFEN 600 MG PO TABS
600.0000 mg | ORAL_TABLET | Freq: Once | ORAL | Status: AC
  Administered 2015-04-24: 600 mg via ORAL
  Filled 2015-04-24: qty 1

## 2015-04-24 NOTE — Discharge Instructions (Signed)
Abdominal Pain During Pregnancy Abdominal pain is common in pregnancy. Most of the time, it does not cause harm. There are many causes of abdominal pain. Some causes are more serious than others. Some of the causes of abdominal pain in pregnancy are easily diagnosed. Occasionally, the diagnosis takes time to understand. Other times, the cause is not determined. Abdominal pain can be a sign that something is very wrong with the pregnancy, or the pain may have nothing to do with the pregnancy at all. For this reason, always tell your health care provider if you have any abdominal discomfort. HOME CARE INSTRUCTIONS  Monitor your abdominal pain for any changes. The following actions may help to alleviate any discomfort you are experiencing:  Do not have sexual intercourse or put anything in your vagina until your symptoms go away completely.  Get plenty of rest until your pain improves.  Drink clear fluids if you feel nauseous. Avoid solid food as long as you are uncomfortable or nauseous.  Only take over-the-counter or prescription medicine as directed by your health care provider.  Keep all follow-up appointments with your health care provider. SEEK IMMEDIATE MEDICAL CARE IF:  You are bleeding, leaking fluid, or passing tissue from the vagina.  You have increasing pain or cramping.  You have persistent vomiting.  You have painful or bloody urination.  You have a fever.  You notice a decrease in your baby's movements.  You have extreme weakness or feel faint.  You have shortness of breath, with or without abdominal pain.  You develop a severe headache with abdominal pain.  You have abnormal vaginal discharge with abdominal pain.  You have persistent diarrhea.  You have abdominal pain that continues even after rest, or gets worse. MAKE SURE YOU:   Understand these instructions.  Will watch your condition.  Will get help right away if you are not doing well or get  worse. Document Released: 11/18/2005 Document Revised: 09/08/2013 Document Reviewed: 06/17/2013 [facility] Patient Information 2015 [place], [place]. This information is not intended to replace advice given to you by your health care provider. Make sure you discuss any questions you have with your health care provider.    Round Ligament Pain During Pregnancy   Round ligament pain is a sharp pain or jabbing feeling often felt in the lower belly or groin area on one or both sides. It is one of the most common complaints during pregnancy and is considered a normal part of pregnancy. It is most often felt during the second trimester.   Here is what you need to know about round ligament pain, including some tips to help you feel better.   Causes of Round Ligament Pain:    Several thick ligaments surround and support your womb (uterus) as it grows during pregnancy. One of them is called the round ligament.   The round ligament connects the front part of the womb to your groin, the area where your legs attach to your pelvis. The round ligament normally tightens and relaxes slowly.   As your baby and womb grow, the round ligament stretches. That makes it more likely to become strained.   Sudden movements can cause the ligament to tighten quickly, like a rubber band snapping. This causes a sudden and quick jabbing feeling.   Symptoms of Round Ligament Pain   Round ligament pain can be concerning and uncomfortable. But it is considered normal as your body changes during pregnancy.   The symptoms of round ligament pain include a  sharp, sudden spasm in the belly. It usually affects the right side, but it may happen on both sides. The pain only lasts a few seconds.   Exercise may cause the pain, as will rapid movements such as:   sneezing  coughing  laughing  rolling over in bed  standing up too quickly   Treatment of Round Ligament Pain   Here are some tips that may help reduce your  discomfort:   Pain relief. Take over-the-counter acetaminophen for pain, if necessary. Ask your doctor if this is OK.   Exercise. Get plenty of exercise to keep your stomach (core) muscles strong. Doing stretching exercises or prenatal yoga can be helpful. Ask your doctor which exercises are safe for you and your baby.   A helpful exercise involves putting your hands and knees on the floor, lowering your head, and pushing your backside into the air.   Avoid sudden movements. Change positions slowly (such as standing up or sitting down) to avoid sudden movements that may cause stretching and pain.   Flex your hips. Bend and flex your hips before you cough, sneeze, or laugh to avoid pulling on the ligaments.   Apply warmth. A heating pad or warm bath may be helpful. Ask your doctor if this is OK. Extreme heat can be dangerous to the baby.   You should try to modify your daily activity level and avoid positions that may worsen the condition.   When to Call the Doctor/Midwife   Always tell your doctor or midwife about any type of pain you have during pregnancy. Round ligament pain is quick and doesn't last long.   Call your health care provider immediately if you have:   severe pain  fever  chills  pain on urination  difficulty walking   Belly pain during pregnancy can be due to many different causes. It is important for your doctor to rule out more serious conditions, including pregnancy complications such as placenta abruption or non-pregnancy illnesses such as:   inguinal hernia  appendicitis  stomach, liver, and kidney problems  Preterm labor pains may sometimes be mistaken for round ligament pain.

## 2015-04-24 NOTE — MAU Provider Note (Signed)
History     CSN: [id number]  Arrival date and time: 04/24/15 1413   First Provider Initiated Contact with Patient 04/24/15 1538      Chief Complaint  Patient presents with  . Abdominal Pain   HPI   Ms.Victoria Harvey is a 34 y.o. female G2P1001 at [redacted]w[redacted]d who presents with abdominal pain. The pain started yesterday. The pain is located all over her abdomen; the pain is described as "unable to prescribe". The pain worsens with movement, it is hard for her to sit down, lay down or change positions without it hurting. The patient is not wearing a pregnancy support belt. She currently rates her pain 9/10; she took tylenol this morning at 0715. She took two tabs.   She was seen for similar complaints about 1 month ago.   Denies intercourse in the last 24 hours.   OB History    Gravida Para Term Preterm AB TAB SAB Ectopic Multiple Living   2 1 1       1       Past Medical History  Diagnosis Date  . Pinched nerve     in back; recently completed course of meds for sxs  . Strep sore throat     Past Surgical History  Procedure Laterality Date  . No past surgeries      Family History  Problem Relation Age of Onset  . Hypertension Mother   . Hypertension Father     History  Substance Use Topics  . Smoking status: Former Smoker -- 0.50 packs/day    Types: Cigarettes    Quit date: 11/24/2014  . Smokeless tobacco: Never Used  . Alcohol Use: No    Allergies: No Known Allergies  Prescriptions prior to admission  Medication Sig Dispense Refill Last Dose  . acetaminophen (TYLENOL) 325 MG tablet Take 650 mg by mouth every 6 (six) hours as needed for mild pain or headache.   03/29/2015 at Unknown time  . acetaminophen-codeine (TYLENOL #3) 300-30 MG per tablet Take 1-2 tablets by mouth every 6 (six) hours as needed for moderate pain. 15 tablet 0 Past Month at Unknown time  . cyclobenzaprine (FLEXERIL) 10 MG tablet Take 1 tablet (10 mg total) by mouth 3 (three) times daily as needed  for muscle spasms. 30 tablet 0   . Doxylamine-Pyridoxine 10-10 MG TBEC Take 2 tablets by mouth at bedtime.   Past Week at Unknown time  . Prenatal Vit-Fe Fumarate-FA (PRENATAL MULTIVITAMIN) TABS tablet Take 1 tablet by mouth daily at 12 noon.   03/29/2015 at Unknown time   Results for orders placed or performed during the hospital encounter of 04/24/15 (from the past 48 hour(s))  Urinalysis, Routine w reflex microscopic     Status: Abnormal   Collection Time: 04/24/15  3:10 PM  Result Value Ref Range   Color, Urine YELLOW YELLOW   APPearance CLEAR CLEAR   Specific Gravity, Urine 1.020 1.005 - 1.030   pH 6.5 5.0 - 8.0   Glucose, UA NEGATIVE NEGATIVE mg/dL   Hgb urine dipstick SMALL (A) NEGATIVE   Bilirubin Urine NEGATIVE NEGATIVE   Ketones, ur 40 (A) NEGATIVE mg/dL   Protein, ur NEGATIVE NEGATIVE mg/dL   Urobilinogen, UA 1.0 0.0 - 1.0 mg/dL   Nitrite NEGATIVE NEGATIVE   Leukocytes, UA NEGATIVE NEGATIVE  Urine microscopic-add on     Status: Abnormal   Collection Time: 04/24/15  3:10 PM  Result Value Ref Range   Squamous Epithelial / LPF FEW (A) RARE  WBC, UA 0-2 <3 WBC/hpf   RBC / HPF 7-10 <3 RBC/hpf   Bacteria, UA FEW (A) RARE   Urine-Other MUCOUS PRESENT     Review of Systems  Constitutional: Negative for fever and chills.  Gastrointestinal: Positive for abdominal pain. Negative for nausea, vomiting, diarrhea and constipation.  Genitourinary:       Denies vaginal bleeding    Physical Exam   Blood pressure 110/47, pulse 100, temperature 98.9 F (37.2 C), temperature source Oral, resp. rate 18, height 5\' 6"  (1.676 m), weight 78.472 kg (173 lb), last menstrual period 11/17/2014.  Physical Exam  Constitutional: She is oriented to person, place, and time. She appears well-developed and well-nourished. No distress.  HENT:  Head: Normocephalic.  Eyes: Pupils are equal, round, and reactive to light.  Neck: Neck supple.  Respiratory: Effort normal.  Genitourinary:  Speculum  exam: Vagina - Small amount of creamy discharge, no odor Cervix - No contact bleeding Bimanual exam: Cervix closed, posterior  Chaperone present for exam.  Musculoskeletal: Normal range of motion.  Neurological: She is alert and oriented to person, place, and time.  Skin: Skin is warm. She is not diaphoretic.  Psychiatric: Her behavior is normal.    Fetal Tracing: Baseline: 145 bpm  Variability: moderate Accelerations: 10x10 Decelerations: quick variables  Toco: none  MAU Course  Procedures  None  MDM  PO hydration encouraged  Ibuprofen 600 mg times 1 dose given   Urine shows dehydration  Patient says the ibuprofen took the edge off of her pain she currently rates her pain 7/10.   Assessment and Plan   A:  1. Abdominal pain in pregnancy   2. Pain of round ligament affecting pregnancy, antepartum   3. Threatened preterm labor, second trimester      P:  Discharge home in stable condition Pregnancy support belt recommended  Patient should follow up with Dr. Ruthann Cancer as scheduled  Continue tylenol as needed, as directed on the bottle Increase PO fluid intake. Patient should drink 6-8 glasses of water per day.    Lezlie Lye, NP 04/24/2015 3:41 PM

## 2015-04-24 NOTE — MAU Note (Signed)
abd pain started yesterday. Constant. Gotten no sleep.

## 2015-04-25 ENCOUNTER — Emergency Department (HOSPITAL_COMMUNITY): Payer: No Typology Code available for payment source

## 2015-04-25 ENCOUNTER — Encounter (HOSPITAL_COMMUNITY): Payer: Self-pay

## 2015-04-25 ENCOUNTER — Inpatient Hospital Stay (HOSPITAL_COMMUNITY)
Admission: AD | Admit: 2015-04-25 | Discharge: 2015-04-28 | DRG: 781 | Disposition: A | Discharge status: Home / Self Care | Payer: No Typology Code available for payment source | Source: Ambulatory Visit | Attending: Obstetrics | Admitting: Obstetrics

## 2015-04-25 DIAGNOSIS — J189 Pneumonia, unspecified organism: Secondary | ICD-10-CM | POA: Diagnosis present

## 2015-04-25 DIAGNOSIS — Z87891 Personal history of nicotine dependence: Secondary | ICD-10-CM

## 2015-04-25 DIAGNOSIS — Z3A24 24 weeks gestation of pregnancy: Secondary | ICD-10-CM | POA: Diagnosis present

## 2015-04-25 DIAGNOSIS — O99512 Diseases of the respiratory system complicating pregnancy, second trimester: Principal | ICD-10-CM | POA: Diagnosis present

## 2015-04-25 DIAGNOSIS — R000 Tachycardia, unspecified: Secondary | ICD-10-CM

## 2015-04-25 DIAGNOSIS — O99519 Diseases of the respiratory system complicating pregnancy, unspecified trimester: Secondary | ICD-10-CM

## 2015-04-25 DIAGNOSIS — R0902 Hypoxemia: Secondary | ICD-10-CM | POA: Diagnosis present

## 2015-04-25 DIAGNOSIS — R0602 Shortness of breath: Secondary | ICD-10-CM | POA: Diagnosis not present

## 2015-04-25 LAB — BASIC METABOLIC PANEL
Anion gap: 11 (ref 5–15)
BUN: 9 mg/dL (ref 6–20)
CO2: 24 mmol/L (ref 22–32)
Calcium: 9.1 mg/dL (ref 8.9–10.3)
Chloride: 101 mmol/L (ref 101–111)
Creatinine, Ser: 0.61 mg/dL (ref 0.44–1.00)
GFR calc Af Amer: >60 mL/min (ref >60)
GFR calc non Af Amer: >60 mL/min (ref >60)
Glucose, Bld: 109 mg/dL — ABNORMAL HIGH (ref 65–99)
Potassium: 3.8 mmol/L (ref 3.5–5.1)
Sodium: 136 mmol/L (ref 135–145)

## 2015-04-25 LAB — CBC
HCT: 35.5 % — ABNORMAL LOW (ref 36.0–46.0)
Hemoglobin: 11.6 g/dL — ABNORMAL LOW (ref 12.0–15.0)
MCH: 30.5 pg (ref 26.0–34.0)
MCHC: 32.7 g/dL (ref 30.0–36.0)
MCV: 93.4 fL (ref 78.0–100.0)
Platelets: 385 10*3/uL (ref 150–400)
RBC: 3.8 MIL/uL — ABNORMAL LOW (ref 3.87–5.11)
RDW: 13.3 % (ref 11.5–15.5)
WBC: 14.5 10*3/uL — ABNORMAL HIGH (ref 4.0–10.5)

## 2015-04-25 MED ORDER — IOHEXOL 350 MG/ML SOLN
100.0000 mL | Freq: Once | INTRAVENOUS | Status: AC | PRN
  Administered 2015-04-25: 100 mL via INTRAVENOUS

## 2015-04-25 MED ORDER — SODIUM CHLORIDE 0.9 % IV BOLUS (SEPSIS)
1000.0000 mL | Freq: Once | INTRAVENOUS | Status: AC
  Administered 2015-04-25: 1000 mL via INTRAVENOUS

## 2015-04-25 MED ORDER — ACETAMINOPHEN 325 MG PO TABS
650.0000 mg | ORAL_TABLET | Freq: Once | ORAL | Status: AC
  Administered 2015-04-25: 650 mg via ORAL
  Filled 2015-04-25: qty 2

## 2015-04-25 MED ORDER — DEXTROSE 5 % IV SOLN
1.0000 g | Freq: Once | INTRAVENOUS | Status: AC
  Administered 2015-04-25: 1 g via INTRAVENOUS
  Filled 2015-04-25: qty 10

## 2015-04-25 MED ORDER — AZITHROMYCIN 500 MG IV SOLR
500.0000 mg | Freq: Once | INTRAVENOUS | Status: AC
  Administered 2015-04-25: 500 mg via INTRAVENOUS
  Filled 2015-04-25: qty 500

## 2015-04-25 NOTE — ED Notes (Signed)
Hana, PA, made aware of patient's pain level.

## 2015-04-25 NOTE — ED Provider Notes (Signed)
CSN: [id number]     Arrival date & time 04/25/15  1711 History   None    Chief Complaint  Patient presents with  . Shortness of Breath     (Consider location/radiation/quality/duration/timing/severity/associated sxs/prior Treatment) Patient is a 34 y.o. female presenting with shortness of breath. The history is provided by the patient. No language interpreter was used.  Shortness of Breath Associated symptoms: no abdominal pain, no chest pain, no cough, no fever, no headaches, no vomiting and no wheezing    Victoria Harvey is a 34 y.o pregnant female with a sudden onset and persistent of shortness of breath and RLL pain that goes into her back. Pain is worse with deep breaths. She denies any headache, CP, dizziness, light headedness, contraction, or vaginal bleeding.  She denies a history of blood clots.   Past Medical History  Diagnosis Date  . Pinched nerve     in back; recently completed course of meds for sxs  . Strep sore throat    Past Surgical History  Procedure Laterality Date  . No past surgeries     Family History  Problem Relation Age of Onset  . Hypertension Mother   . Hypertension Father    History  Substance Use Topics  . Smoking status: Former Smoker -- 0.50 packs/day    Types: Cigarettes    Quit date: 11/24/2014  . Smokeless tobacco: Never Used  . Alcohol Use: No   OB History    Gravida Para Term Preterm AB TAB SAB Ectopic Multiple Living   2 1 1       1      Review of Systems  Constitutional: Negative for fever.  Respiratory: Positive for shortness of breath. Negative for cough and wheezing.   Cardiovascular: Negative for chest pain.  Gastrointestinal: Negative for nausea, vomiting and abdominal pain.  Genitourinary: Negative for pelvic pain.  Neurological: Negative for dizziness and headaches.  All other systems reviewed and are negative.     Allergies  Review of patient's allergies indicates no known allergies.  Home Medications   Prior to  Admission medications   Medication Sig Start Date End Date Taking? Authorizing Provider  acetaminophen (TYLENOL) 325 MG tablet Take 650 mg by mouth every 6 (six) hours as needed for mild pain (back pain).    Yes Historical Provider, MD  cyclobenzaprine (FLEXERIL) 10 MG tablet Take 1 tablet (10 mg total) by mouth 3 (three) times daily as needed for muscle spasms. 03/29/15  Yes Tresea Mall, CNM  Prenatal Vit-Fe Fumarate-FA (PRENATAL MULTIVITAMIN) TABS tablet Take 1 tablet by mouth daily at 12 noon.   Yes Historical Provider, MD   BP 127/60 mmHg  Pulse 124  Temp(Src) 98.6 F (37 C) (Oral)  Resp 33  SpO2 97%  LMP 11/17/2014 Physical Exam  Constitutional: She is oriented to person, place, and time. She appears well-developed and well-nourished.  HENT:  Head: Normocephalic and atraumatic.  Eyes: Conjunctivae are normal.  Neck: Normal range of motion. Neck supple.  Cardiovascular: Regular rhythm and normal heart sounds.  Tachycardia present.   Pulmonary/Chest: Effort normal and breath sounds normal. Tachypnea noted. No respiratory distress. She has no wheezes. She has no rales.  Abdominal: Soft. There is no tenderness.  Abdomen distended due to pregnancy.   Musculoskeletal: Normal range of motion.  Neurological: She is alert and oriented to person, place, and time.  Skin: Skin is warm and dry.  Nursing note and vitals reviewed.   ED Course  Procedures (including critical care  time) Labs Review Labs Reviewed  CBC - Abnormal; Notable for the following:    WBC 14.5 (*)    RBC 3.80 (*)    Hemoglobin 11.6 (*)    HCT 35.5 (*)    All other components within normal limits  BASIC METABOLIC PANEL - Abnormal; Notable for the following:    Glucose, Bld 109 (*)    All other components within normal limits  I-STAT TROPOININ, ED    Imaging Review Ct Angio Chest Pe W/cm &/or Wo Cm  04/25/2015   CLINICAL DATA:  Sudden onset of shortness of breath and right-sided flank pain. Evaluate for  pulmonary embolism.  EXAM: CT ANGIOGRAPHY CHEST WITH CONTRAST  TECHNIQUE: Multidetector CT imaging of the chest was performed using the standard protocol during bolus administration of intravenous contrast. Multiplanar CT image reconstructions and MIPs were obtained to evaluate the vascular anatomy.  CONTRAST:  1110mL OMNIPAQUE IOHEXOL 350 MG/ML SOLN  COMPARISON:  Chest CT- 06/01/2013  FINDINGS: Vascular Findings:  There is adequate opacification of the pulmonary arterial system with the main pulmonary artery measuring 290 Hounsfield units. There are no discrete filling defects within the pulmonary arterial tree to suggest pulmonary embolism. Normal caliber the main pulmonary artery.  Borderline cardiomegaly.  No pericardial effusion.  Normal caliber of the thoracic aorta. No definite thoracic aortic dissection on this nongated examination. Conventional configuration aortic arch. Branch vessels of the aortic arch are widely patent throughout their imaged course.  Review of the MIP images confirms the above findings.   ----------------------------------------------------------------------------------  Nonvascular Findings:  There ill-defined heterogeneous in ground-glass opacities within the right lower lobe (image 54, series 11) with less well-defined heterogeneous opacities within the dependent portion of the contralateral left lower lobe (image 61, series 11). Several small air bronchograms are noted within the right lower lobe consolidative opacities. No pleural effusion or pneumothorax. The central pulmonary airways appear widely patent.  No discrete pulmonary nodules. No mediastinal, hilar or axillary lymphadenopathy.  Limited early arterial phase evaluation of the upper abdomen is normal.  No acute or aggressive osseous abnormalities.  The bilateral breast tissues appear heterogeneously dense compatible with gravid state. Normal appearance of the thyroid gland.  IMPRESSION: 1. No evidence of pulmonary embolism.  2. Findings worrisome for bilateral lower lobe pneumonia, right greater than left. 3. Borderline cardiomegaly, potentially attributable to patient's gravid state.   Electronically Signed   By: Sandi Mariscal M.D.   On: 04/25/2015 20:29     EKG Interpretation   Date/Time:  Tuesday Apr 25 2015 17:28:21 EDT Ventricular Rate:  118 PR Interval:  142 QRS Duration: 87 QT Interval:  319 QTC Calculation: 447 R Axis:   33 Text Interpretation:  Sinus tachycardia Consider anterior infarct  Borderline T abnormalities, inferior leads Baseline wander in lead(s) V1  agree Confirmed by Johnney Killian, MD, Jeannie Done [phone]) on 04/25/2015 7:23:01 PM      MDM   Final diagnoses:  Bilateral pneumonia  Tachycardia  Patient with gravid uterus presents for sudden onset shortness of breath while at work today.  Worse with deep breathing. She denies any chest pain. 19:05 OB nurse is at bedside with fetal monitoring.  Patient is tachycardic and RR in the 40's at bedside.  I suspect that she may have a PE.  I discussed this patient with Dr. Johnney Killian who has seen the patient and ordered a CTA chest to rule out PE.  OBGYN nurse spoke with Dr. Kennon Rounds and states that he is not concerned with the fetal monitoring  and she is cleared from their standpoint.  We are to call if she is admitted so that they can continue to monitor her. CTA negative for PE but she does have bilateral lower lobe pneumonia R>L.  Patient is still tachycardic and her RR is in the 30's.  I have started her on rocephin, azithromycin, and fluids. 21:03 I spoke to Dr. Kennon Rounds, the patients OBGYN physician who stated the patient can be admitted to MAU.  I discussed the finding and transfer with the patient and mother who agree with the plan.    Ottie Glazier, PA-C 04/25/15 2213  Charlesetta Shanks, MD 04/28/15 [phone]

## 2015-04-25 NOTE — ED Notes (Signed)
Spoke with charge nurse at [employer], she will call back with assigned room.

## 2015-04-25 NOTE — ED Notes (Addendum)
Patient was at work and had a sudden onset of SOB and has pain right flank area. Patient denies any CP, dizziness, or blurred vision. Patient is 6 months pregnant

## 2015-04-25 NOTE — Progress Notes (Signed)
1842  Arrived to evaluate this 34 yo G2P1 @23 .[redacted] wks GA in with complaint of pain right side chest, pain increases with deep breath, also complains of SOB.  Reports good fetal movement and denies vaginal bleeding, LOF, or abdominal pain.  FHR tracing is WNL with no signs of uterine contractions.  1925  Dr. Ruthann Cancer notified of patient and of her complaints and of FHR/ monitoring results.  States patient is OB cleared and OBRR RN may remove EFM.  States ED providers may continue with appropriate medical treatment and diagnostics as indicated.

## 2015-04-25 NOTE — ED Notes (Signed)
Bed: RESB Expected date:  Expected time:  Means of arrival:  Comments: Triage 1

## 2015-04-26 DIAGNOSIS — Z87891 Personal history of nicotine dependence: Secondary | ICD-10-CM | POA: Diagnosis not present

## 2015-04-26 DIAGNOSIS — R0902 Hypoxemia: Secondary | ICD-10-CM | POA: Diagnosis not present

## 2015-04-26 DIAGNOSIS — R091 Pleurisy: Secondary | ICD-10-CM

## 2015-04-26 DIAGNOSIS — J189 Pneumonia, unspecified organism: Secondary | ICD-10-CM | POA: Diagnosis not present

## 2015-04-26 DIAGNOSIS — R0602 Shortness of breath: Secondary | ICD-10-CM

## 2015-04-26 DIAGNOSIS — Z3A24 24 weeks gestation of pregnancy: Secondary | ICD-10-CM | POA: Diagnosis present

## 2015-04-26 DIAGNOSIS — O99512 Diseases of the respiratory system complicating pregnancy, second trimester: Secondary | ICD-10-CM

## 2015-04-26 MED ORDER — CEFTRIAXONE SODIUM IN DEXTROSE 20 MG/ML IV SOLN
1.0000 g | Freq: Two times a day (BID) | INTRAVENOUS | Status: DC
  Administered 2015-04-26 – 2015-04-28 (×5): 1 g via INTRAVENOUS
  Filled 2015-04-26 (×5): qty 50

## 2015-04-26 MED ORDER — ACETAMINOPHEN-CODEINE #3 300-30 MG PO TABS
1.0000 | ORAL_TABLET | ORAL | Status: DC | PRN
  Administered 2015-04-26: 1 via ORAL
  Administered 2015-04-26 – 2015-04-27 (×3): 2 via ORAL
  Filled 2015-04-26 (×2): qty 2
  Filled 2015-04-26: qty 1
  Filled 2015-04-26: qty 2

## 2015-04-26 MED ORDER — AZITHROMYCIN 500 MG PO TABS
500.0000 mg | ORAL_TABLET | Freq: Every day | ORAL | Status: DC
  Administered 2015-04-26 – 2015-04-28 (×3): 500 mg via ORAL
  Filled 2015-04-26 (×3): qty 1

## 2015-04-26 MED ORDER — ZOLPIDEM TARTRATE 5 MG PO TABS
5.0000 mg | ORAL_TABLET | Freq: Every evening | ORAL | Status: DC | PRN
  Administered 2015-04-26: 5 mg via ORAL
  Filled 2015-04-26: qty 1

## 2015-04-26 MED ORDER — SODIUM CHLORIDE 0.9 % IV SOLN
INTRAVENOUS | Status: DC
  Administered 2015-04-26 – 2015-04-28 (×5): via INTRAVENOUS

## 2015-04-26 NOTE — H&P (Signed)
Chief complaint shortness of breath acute History of present illness the patient's 34 year old gravida 2 para 101 at 12 weeks and 6 days [facility] 08/17/2015 patient states that on Sunday she had some lower abdominal pain came to [facility] and was told she was dehydrated and was having some irritability and she was discharged home after fluids yesterday at work she developed acute shortness of breath underwent a [facility] and the chest x-ray showed infiltrates bilaterally no chest CT of her chest showed pneumonia she was started on Rocephin and azithromycin and admitted to [facility] patient states that she has not had a cough or fever Past medical history has been hospitalized only for childbirth not taking any medication past from prenatal vitamins Past surgical history denies any surgery in the past Social history nonsmoker nondrinker and does not use any drugs 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 Gracy Racer

## 2015-04-26 NOTE — Consult Note (Signed)
Name: Victoria Harvey MRN: [id number] DOB: 03-31-1981    ADMISSION DATE:  04/25/2015 CONSULTATION DATE:  04/26/2015  REFERRING MD :  Cyndie Mull  CHIEF COMPLAINT:  SOB and pulmonary infiltrate  BRIEF PATIENT DESCRIPTION: 34 year old [redacted] wk pregnant female with no previous pulmonary history [facility] with the chief complaint of SOB and right sided chest pain.  CTA was done, negative for PE but positive for R>L LL infiltrate.  Patient is afebrile but WBC is elevated.  Denies cough, sputum production or fever/chills.  Does report a previous pneumonia on the same side 1 year ago.  STUDIES:  5/24 CTA with bilateral infiltrate, no PE.   HISTORY OF PRESENT ILLNESS:  34 year old [redacted] wk pregnant female with no previous pulmonary history presents to [facility] with the chief complaint of SOB and right sided chest pain.  CTA was done, negative for PE but positive for R>L LL infiltrate.  Patient is afebrile but WBC is elevated.  Denies cough, sputum production or fever/chills.  Does report a previous pneumonia on the same side 1 year ago.  PAST MEDICAL HISTORY :   has a past medical history of Pinched nerve and Strep sore throat.  has past surgical history that includes No past surgeries. Prior to Admission medications   Medication Sig Start Date End Date Taking? Authorizing Provider  acetaminophen (TYLENOL) 325 MG tablet Take 650 mg by mouth every 6 (six) hours as needed for mild pain (back pain).    Yes Historical Provider, MD  cyclobenzaprine (FLEXERIL) 10 MG tablet Take 1 tablet (10 mg total) by mouth 3 (three) times daily as needed for muscle spasms. 03/29/15  Yes Tresea Mall, CNM  Prenatal Vit-Fe Fumarate-FA (PRENATAL MULTIVITAMIN) TABS tablet Take 1 tablet by mouth daily at 12 noon.   Yes Historical Provider, MD   No Known Allergies  FAMILY HISTORY:  family history includes Hypertension in her father and mother. SOCIAL  HISTORY:  reports that she quit smoking about 5 months ago. Her smoking use included Cigarettes. She smoked 0.50 packs per day. She has never used smokeless tobacco. She reports that she does not drink alcohol or use illicit drugs.  REVIEW OF SYSTEMS:   Constitutional: Negative for fever, chills, weight loss, malaise/fatigue and diaphoresis.  HENT: Negative for hearing loss, ear pain, nosebleeds, congestion, sore throat, neck pain, tinnitus and ear discharge.   Eyes: Negative for blurred vision, double vision, photophobia, pain, discharge and redness.  Respiratory: Negative for cough, hemoptysis, sputum production, wheezing and stridor.  Positive for SOB and right sided chest pain at the lateral aspect. Cardiovascular: Negative for chest pain, palpitations, orthopnea, claudication, leg swelling and PND.  Gastrointestinal: Negative for heartburn, nausea, vomiting, abdominal pain, diarrhea, constipation, blood in stool and melena.  Genitourinary: Negative for dysuria, urgency, frequency, hematuria and flank pain.  Musculoskeletal: Negative for myalgias, back pain, joint pain and falls.  Skin: Negative for itching and rash.  Neurological: Negative for dizziness, tingling, tremors, sensory change, speech change, focal weakness, seizures, loss of consciousness, weakness and headaches.  Endo/Heme/Allergies: Negative for environmental allergies and polydipsia. Does not bruise/bleed easily.  SUBJECTIVE:   VITAL SIGNS: Temp:  [98.5 F (36.9 C)-99 F (37.2 C)] 98.5 F (36.9 C) (05/25 1225) Pulse Rate:  [112-129] 112 (05/25 1225) Resp:  [18-33] 18 (05/25 1225) BP: (123-141)/(60-78) 123/72 mmHg (05/25 1225) SpO2:  [95 %-100 %] 98 % (05/25 0651) Weight:  [78.699 kg (173  lb 8 oz)] 78.699 kg (173 lb 8 oz) (05/25 0647)  PHYSICAL EXAMINATION: General:  Well appearing gravid female in NAD, on RA. Neuro:  Alert and interactive, following all commands. Head: /AT EENT:  PERRL, EOM-I and  MMM. Cardiovascular:  RRR, Nl S1/S2, -M/R/G. Lungs:  Bibasilar crackles R>L. Abdomen:  Gravid, soft, NT, ND and +BS. Musculoskeletal:  -edema and -tenderness. Skin:  Intact.   Recent Labs Lab 04/25/15 1852  NA 136  K 3.8  CL 101  CO2 24  BUN 9  CREATININE 0.61  GLUCOSE 109*    Recent Labs Lab 04/25/15 1852  HGB 11.6*  HCT 35.5*  WBC 14.5*  PLT 385   Ct Angio Chest Pe W/cm &/or Wo Cm  04/25/2015   CLINICAL DATA:  Sudden onset of shortness of breath and right-sided flank pain. Evaluate for pulmonary embolism.  EXAM: CT ANGIOGRAPHY CHEST WITH CONTRAST  TECHNIQUE: Multidetector CT imaging of the chest was performed using the standard protocol during bolus administration of intravenous contrast. Multiplanar CT image reconstructions and MIPs were obtained to evaluate the vascular anatomy.  CONTRAST:  110mL OMNIPAQUE IOHEXOL 350 MG/ML SOLN  COMPARISON:  Chest CT- 06/01/2013  FINDINGS: Vascular Findings:  There is adequate opacification of the pulmonary arterial system with the main pulmonary artery measuring 290 Hounsfield units. There are no discrete filling defects within the pulmonary arterial tree to suggest pulmonary embolism. Normal caliber the main pulmonary artery.  Borderline cardiomegaly.  No pericardial effusion.  Normal caliber of the thoracic aorta. No definite thoracic aortic dissection on this nongated examination. Conventional configuration aortic arch. Branch vessels of the aortic arch are widely patent throughout their imaged course.  Review of the MIP images confirms the above findings.   ----------------------------------------------------------------------------------  Nonvascular Findings:  There ill-defined heterogeneous in ground-glass opacities within the right lower lobe (image 54, series 11) with less well-defined heterogeneous opacities within the dependent portion of the contralateral left lower lobe (image 61, series 11). Several small air bronchograms are noted  within the right lower lobe consolidative opacities. No pleural effusion or pneumothorax. The central pulmonary airways appear widely patent.  No discrete pulmonary nodules. No mediastinal, hilar or axillary lymphadenopathy.  Limited early arterial phase evaluation of the upper abdomen is normal.  No acute or aggressive osseous abnormalities.  The bilateral breast tissues appear heterogeneously dense compatible with gravid state. Normal appearance of the thyroid gland.  IMPRESSION: 1. No evidence of pulmonary embolism. 2. Findings worrisome for bilateral lower lobe pneumonia, right greater than left. 3. Borderline cardiomegaly, potentially attributable to patient's gravid state.   Electronically Signed   By: Sandi Mariscal M.D.   On: 04/25/2015 20:29    ASSESSMENT / PLAN:  34 year old female with no significant PMH presenting to the hospital with SOB, hypoxemia and chest pain.   SOB: due to PNA.  This is an CAP.  - Pan culture.  - Rocephin and zithromax.  - F/U on cultures.  - If worsens can consider broader coverage.  Hypoxemia: Due to PNA.  - Supplemental O2 as needed.  - Titrate for sat >95%.  - No need for ambulatory desat study, will come off O2 once PNA is treated.  Chest Pain: Pleuretic in nature, likely pleurisy due to pneumonia.  - NSAIDs as needed if safe with pregnancy.  - Treat underlying cause.  If additional help with infectious process is needed please call the ID service.  Nothing further for pulmonary to address.  PCCM will sign off, please call  back if needed.  Rush Farmer, M.D. [facility] Pulmonary/Critical Care Medicine. Pager: [phone]. After hours pager: [phone].  04/26/2015, 1:04 PM

## 2015-04-27 LAB — CBC
HCT: 28.1 % — ABNORMAL LOW (ref 36.0–46.0)
Hemoglobin: 9.6 g/dL — ABNORMAL LOW (ref 12.0–15.0)
MCH: 31.3 pg (ref 26.0–34.0)
MCHC: 34.2 g/dL (ref 30.0–36.0)
MCV: 91.5 fL (ref 78.0–100.0)
Platelets: 328 10*3/uL (ref 150–400)
RBC: 3.07 MIL/uL — ABNORMAL LOW (ref 3.87–5.11)
RDW: 13.3 % (ref 11.5–15.5)
WBC: 9.7 10*3/uL (ref 4.0–10.5)

## 2015-04-27 NOTE — Progress Notes (Signed)
Patient ID: Victoria Harvey, female   DOB: 1981-02-25, 34 y.o.   MRN: [id number] Subjective patient feels much better her chest pain has decreased and her shortness of breath has decreased she is encouraged to ambulate outside of the room Objective blood pressure is 130/69 respiration 16 pulse 110 temperature 97.9 she remains afebrile Her white count which was 14.9 on admission has no decreased to 9.7 normal range Assessment patient is improving tomorrow will repeat her chest x-ray System review she denies headache shortness of breath chills abdominal paincalf  tenderness Plan continue present antibodies and increase ambulation Chest x-ray tomorrow and if patient remains afebrile she will be discharged for outpatient treatment she was seen in consult by pulmonary which agrees with her present therapy Dictated by Dr. Gracy Racer

## 2015-04-28 ENCOUNTER — Inpatient Hospital Stay (HOSPITAL_COMMUNITY): Payer: No Typology Code available for payment source

## 2015-04-28 NOTE — Progress Notes (Signed)
Patient ID: Victoria Harvey, female   DOB: 25-May-1981, 34 y.o.   MRN: [id number] Blood pressure 116/58 respiration 15 pulse 99 temp 98.6 Patient says he feels fine no more shortness of breath no cough Lungs are clear than they were yesterday Abdomen soft uterus 24 weeks size System review she has no shortness of breath she has no cough she has no fever she has no abdominal pain she has no calf tenderness Patient will be discharged today on Z-Pak to see me in one week

## 2015-04-28 NOTE — Discharge Summary (Signed)
The patient's a 34 year old gravida 2 para 1 at 24 weeks was admitted 3 days ago with acute shortness of breath and she had a spiral CT of the lungs showed bilateral lower lobe pneumonia no PE and the patient was admitted and treated with Rocephin 1 g IV every 12 hours and azithromycin she progressively got better she remained afebrile and she had no cough and was no sputum collection done today she feels fine and would be discharged on a Z-Pak to see me in one week Dictated by Dr. Gracy Racer

## 2015-04-28 NOTE — Discharge Instructions (Signed)
Discharge instructions   You can wash your hair  Shower  Eat what you want  Drink what you want  See me in 6 weeks  Your ankles are going to swell more in the next 2 weeks than when pregnant  No sex for 6 weeks   Arik Husmann A, MD 04/28/2015

## 2015-05-28 ENCOUNTER — Inpatient Hospital Stay (HOSPITAL_COMMUNITY)
Admission: AD | Admit: 2015-05-28 | Discharge: 2015-05-28 | Disposition: A | Discharge status: Home / Self Care | Payer: Medicaid Other | Source: Ambulatory Visit | Attending: Obstetrics | Admitting: Obstetrics

## 2015-05-28 ENCOUNTER — Encounter (HOSPITAL_COMMUNITY): Payer: Self-pay | Admitting: *Deleted

## 2015-05-28 DIAGNOSIS — R109 Unspecified abdominal pain: Secondary | ICD-10-CM | POA: Diagnosis not present

## 2015-05-28 DIAGNOSIS — Z3A28 28 weeks gestation of pregnancy: Secondary | ICD-10-CM | POA: Diagnosis not present

## 2015-05-28 DIAGNOSIS — Z87891 Personal history of nicotine dependence: Secondary | ICD-10-CM | POA: Insufficient documentation

## 2015-05-28 DIAGNOSIS — R824 Acetonuria: Secondary | ICD-10-CM | POA: Diagnosis not present

## 2015-05-28 DIAGNOSIS — O26899 Other specified pregnancy related conditions, unspecified trimester: Secondary | ICD-10-CM

## 2015-05-28 DIAGNOSIS — O9989 Other specified diseases and conditions complicating pregnancy, childbirth and the puerperium: Secondary | ICD-10-CM

## 2015-05-28 HISTORY — DX: Pneumonia, unspecified organism: J18.9

## 2015-05-28 LAB — URINALYSIS, ROUTINE W REFLEX MICROSCOPIC
Bilirubin Urine: NEGATIVE
Glucose, UA: NEGATIVE mg/dL
Ketones, ur: >80 mg/dL — AB
Leukocytes, UA: NEGATIVE
Nitrite: NEGATIVE
Protein, ur: NEGATIVE mg/dL
Specific Gravity, Urine: 1.02 (ref 1.005–1.030)
Urobilinogen, UA: 0.2 mg/dL (ref 0.0–1.0)
pH: 6.5 (ref 5.0–8.0)

## 2015-05-28 LAB — URINE MICROSCOPIC-ADD ON

## 2015-05-28 NOTE — Discharge Instructions (Signed)

## 2015-05-28 NOTE — MAU Provider Note (Signed)
History     CSN: [id number]  Arrival date and time: 05/28/15 1459   First Provider Initiated Contact with Patient 05/28/15 1536      No chief complaint on file.  HPI 34 y.o. G2P1001 @[redacted]w[redacted]d  presents to the MAU stating that she is feeling vaginal pressure and occassional contractions. Denies vaginal bleeding, LOF. Reports positive fetal movement. Past Medical History  Diagnosis Date  . Pinched nerve     in back; recently completed course of meds for sxs  . Strep sore throat   . Pneumonia     Past Surgical History  Procedure Laterality Date  . No past surgeries    . Wisdom tooth extraction      Family History  Problem Relation Age of Onset  . Hypertension Mother   . Hypertension Father     History  Substance Use Topics  . Smoking status: Former Smoker -- 0.50 packs/day    Types: Cigarettes    Quit date: 11/24/2014  . Smokeless tobacco: Never Used  . Alcohol Use: No    Allergies: No Known Allergies  Prescriptions prior to admission  Medication Sig Dispense Refill Last Dose  . acetaminophen (TYLENOL) 500 MG tablet Take 500-1,000 mg by mouth every 6 (six) hours as needed for mild pain.   05/27/2015 at Unknown time  . Prenatal Vit-Fe Fumarate-FA (PRENATAL MULTIVITAMIN) TABS tablet Take 1 tablet by mouth daily at 12 noon.   05/28/2015 at Unknown time    Review of Systems  Constitutional: Negative for fever.  Gastrointestinal: Positive for abdominal pain.  Genitourinary:       Vaginal pressure  All other systems reviewed and are negative.  Physical Exam   Blood pressure 111/53, pulse 99, temperature 98.3 F (36.8 C), temperature source Oral, resp. rate 16, height 5\' 8"  (1.727 m), weight 81.647 kg (180 lb), last menstrual period 11/17/2014. Results for orders placed or performed during the hospital encounter of 05/28/15 (from the past 24 hour(s))  Urinalysis, Routine w reflex microscopic (not at [facility])     Status: Abnormal   Collection Time: 05/28/15  3:11 PM   Result Value Ref Range   Color, Urine YELLOW YELLOW   APPearance CLEAR CLEAR   Specific Gravity, Urine 1.020 1.005 - 1.030   pH 6.5 5.0 - 8.0   Glucose, UA NEGATIVE NEGATIVE mg/dL   Hgb urine dipstick SMALL (A) NEGATIVE   Bilirubin Urine NEGATIVE NEGATIVE   Ketones, ur >80 (A) NEGATIVE mg/dL   Protein, ur NEGATIVE NEGATIVE mg/dL   Urobilinogen, UA 0.2 0.0 - 1.0 mg/dL   Nitrite NEGATIVE NEGATIVE   Leukocytes, UA NEGATIVE NEGATIVE  Urine microscopic-add on     Status: None   Collection Time: 05/28/15  3:11 PM  Result Value Ref Range   Squamous Epithelial / LPF RARE RARE   WBC, UA 3-6 <3 WBC/hpf   RBC / HPF 0-2 <3 RBC/hpf   Urine-Other MUCOUS PRESENT    Physical Exam  Nursing note and vitals reviewed. Constitutional: She is oriented to person, place, and time. She appears well-developed and well-nourished. No distress.  HENT:  Head: Normocephalic and atraumatic.  Neck: Normal range of motion.  Cardiovascular: Normal rate.   Respiratory: Effort normal. No respiratory distress.  GI: Soft.  Genitourinary: Vagina normal.  Musculoskeletal: Normal range of motion. She exhibits no edema.  Neurological: She is alert and oriented to person, place, and time.  Skin: Skin is warm and dry.  Psychiatric: She has a normal mood and affect. Her behavior is normal.  Judgment and thought content normal.    MAU Course  Procedures  MDM EFm- Fht's Cat 1 no contractions noted on monitor Dilation: Closed Effacement (%): Thick Cervical Position: Posterior Exam by:: Cecille Rubin Clemmons CNM No cervical change after 1 hour  Assessment and Plan  Abdominal pain in Pregnancy Ketonuria  Advised to drink plenty of water Discharge to home  [facility] 05/28/2015, 4:04 PM

## 2015-05-28 NOTE — MAU Provider Note (Signed)
Pt c/o lower abd cramping for 1-2 weeks but today the cramping was accompanied with vag/rectal and low abd pressure.  Frequent urination for the past three days.  Denies vag bleeding or discharge.  Reports good fetal movement.

## 2015-06-06 ENCOUNTER — Emergency Department (HOSPITAL_COMMUNITY)
Admission: EM | Admit: 2015-06-06 | Discharge: 2015-06-06 | Disposition: A | Discharge status: Home / Self Care | Payer: Medicaid Other | Attending: Emergency Medicine | Admitting: Emergency Medicine

## 2015-06-06 ENCOUNTER — Encounter (HOSPITAL_COMMUNITY): Payer: Self-pay | Admitting: Emergency Medicine

## 2015-06-06 DIAGNOSIS — Z8669 Personal history of other diseases of the nervous system and sense organs: Secondary | ICD-10-CM | POA: Insufficient documentation

## 2015-06-06 DIAGNOSIS — Z8709 Personal history of other diseases of the respiratory system: Secondary | ICD-10-CM | POA: Insufficient documentation

## 2015-06-06 DIAGNOSIS — Z87891 Personal history of nicotine dependence: Secondary | ICD-10-CM | POA: Diagnosis not present

## 2015-06-06 DIAGNOSIS — K088 Other specified disorders of teeth and supporting structures: Secondary | ICD-10-CM | POA: Diagnosis not present

## 2015-06-06 DIAGNOSIS — K0889 Other specified disorders of teeth and supporting structures: Secondary | ICD-10-CM

## 2015-06-06 DIAGNOSIS — Z8701 Personal history of pneumonia (recurrent): Secondary | ICD-10-CM | POA: Insufficient documentation

## 2015-06-06 MED ORDER — ACETAMINOPHEN 325 MG PO TABS
650.0000 mg | ORAL_TABLET | ORAL | Status: DC | PRN
  Administered 2015-06-06: 650 mg via ORAL
  Filled 2015-06-06: qty 2

## 2015-06-06 MED ORDER — HYDROCODONE-ACETAMINOPHEN 5-325 MG PO TABS: 1.0000 | ORAL_TABLET | Freq: Four times a day (QID) | ORAL | Status: DC | PRN

## 2015-06-06 MED ORDER — CEPHALEXIN 250 MG PO CAPS: 250.0000 mg | ORAL_CAPSULE | Freq: Four times a day (QID) | ORAL | Status: DC

## 2015-06-06 NOTE — Discharge Instructions (Signed)

## 2015-06-06 NOTE — ED Provider Notes (Signed)
CSN: [id number]     Arrival date & time 06/06/15  0349 History   First MD Initiated Contact with Patient 06/06/15 0407     Chief Complaint  Patient presents with  . Dental Pain     (Consider location/radiation/quality/duration/timing/severity/associated sxs/prior Treatment) HPI Patient presents with left upper posterior molar dental pain for the past day. No known trauma. Pain is constant. Has been taking Tylenol at home with little relief. Last took Tylenol 4 hours prior to arrival. Patient has a history of chronic dental pain. States she does not have a Pharmacist, community. She is in her third trimester pregnancy without complaints. Past Medical History  Diagnosis Date  . Pinched nerve     in back; recently completed course of meds for sxs  . Strep sore throat   . Pneumonia    Past Surgical History  Procedure Laterality Date  . No past surgeries    . Wisdom tooth extraction     Family History  Problem Relation Age of Onset  . Hypertension Mother   . Hypertension Father    History  Substance Use Topics  . Smoking status: Former Smoker -- 0.00 packs/day    Types: Cigarettes    Quit date: 11/24/2014  . Smokeless tobacco: Never Used  . Alcohol Use: No   OB History    Gravida Para Term Preterm AB TAB SAB Ectopic Multiple Living   2 1 1       1      Review of Systems  Constitutional: Negative for fever and chills.  HENT: Positive for dental problem. Negative for facial swelling.   Gastrointestinal: Negative for abdominal pain.  Genitourinary: Negative for vaginal bleeding.  All other systems reviewed and are negative.     Allergies  Review of patient's allergies indicates no known allergies.  Home Medications   Prior to Admission medications   Medication Sig Start Date End Date Taking? Authorizing Provider  acetaminophen (TYLENOL) 500 MG tablet Take 500-1,000 mg by mouth every 6 (six) hours as needed for mild pain.    Historical Provider, MD  cephALEXin (KEFLEX) 250 MG  capsule Take 1 capsule (250 mg total) by mouth 4 (four) times daily. 06/06/15   Julianne Rice, MD  HYDROcodone-acetaminophen (NORCO) 5-325 MG per tablet Take 1 tablet by mouth every 6 (six) hours as needed for severe pain. 06/06/15   Julianne Rice, MD  Prenatal Vit-Fe Fumarate-FA (PRENATAL MULTIVITAMIN) TABS tablet Take 1 tablet by mouth daily at 12 noon.    Historical Provider, MD   BP 116/65 mmHg  Pulse 80  Temp(Src) 98 F (36.7 C) (Oral)  Resp 16  SpO2 100%  LMP 11/17/2014 Physical Exam  Constitutional: She is oriented to person, place, and time. She appears well-developed and well-nourished. No distress.  HENT:  Head: Normocephalic and atraumatic.  Mouth/Throat: Oropharynx is clear and moist.  Poor dentition. Tenderness to percussion over the left superior third molar. No obvious swelling or abscess present  Eyes: EOM are normal. Pupils are equal, round, and reactive to light.  Neck: Normal range of motion. Neck supple.  Cardiovascular: Normal rate and regular rhythm.   Pulmonary/Chest: Effort normal and breath sounds normal.  Abdominal: Soft. Bowel sounds are normal. There is no tenderness.  Gravid  Musculoskeletal: Normal range of motion. She exhibits no edema or tenderness.  Neurological: She is alert and oriented to person, place, and time.  Skin: Skin is warm and dry. No rash noted. No erythema.  Psychiatric: She has a normal mood and affect.  Her behavior is normal.  Nursing note and vitals reviewed.   ED Course  Procedures (including critical care time) Labs Review Labs Reviewed - No data to display  Imaging Review No results found.   EKG Interpretation None      MDM   Final diagnoses:  Pain, dental   Tylenol re-dosed in ED. advised to follow-up with dentist.  Julianne Rice, MD 06/06/15 [phone]

## 2015-06-06 NOTE — ED Notes (Signed)
Pt. reports left upper molar pain onset today .

## 2015-06-23 ENCOUNTER — Other Ambulatory Visit (HOSPITAL_COMMUNITY): Payer: Self-pay | Admitting: Obstetrics

## 2015-06-23 DIAGNOSIS — O365939 Maternal care for other known or suspected poor fetal growth, third trimester, other fetus: Secondary | ICD-10-CM

## 2015-06-23 DIAGNOSIS — Z3A33 33 weeks gestation of pregnancy: Secondary | ICD-10-CM

## 2015-06-29 ENCOUNTER — Ambulatory Visit (HOSPITAL_COMMUNITY)
Admission: RE | Admit: 2015-06-29 | Discharge: 2015-06-29 | Disposition: A | Discharge status: Home / Self Care | Payer: Medicaid Other | Source: Ambulatory Visit | Attending: Obstetrics | Admitting: Obstetrics

## 2015-06-29 ENCOUNTER — Encounter (HOSPITAL_COMMUNITY): Payer: Self-pay

## 2015-06-29 VITALS — BP 130/77 | HR 98 | Wt 174.5 lb

## 2015-06-29 DIAGNOSIS — Z3A33 33 weeks gestation of pregnancy: Secondary | ICD-10-CM

## 2015-06-29 DIAGNOSIS — R109 Unspecified abdominal pain: Secondary | ICD-10-CM

## 2015-06-29 DIAGNOSIS — O26849 Uterine size-date discrepancy, unspecified trimester: Secondary | ICD-10-CM | POA: Insufficient documentation

## 2015-06-29 DIAGNOSIS — O365930 Maternal care for other known or suspected poor fetal growth, third trimester, not applicable or unspecified: Secondary | ICD-10-CM | POA: Insufficient documentation

## 2015-06-29 DIAGNOSIS — O26843 Uterine size-date discrepancy, third trimester: Secondary | ICD-10-CM

## 2015-06-29 DIAGNOSIS — O365939 Maternal care for other known or suspected poor fetal growth, third trimester, other fetus: Secondary | ICD-10-CM

## 2015-06-29 DIAGNOSIS — O26899 Other specified pregnancy related conditions, unspecified trimester: Secondary | ICD-10-CM

## 2015-07-17 LAB — OB RESULTS CONSOLE GBS: GBS: POSITIVE

## 2015-07-27 ENCOUNTER — Encounter (HOSPITAL_COMMUNITY): Payer: Self-pay | Admitting: Obstetrics

## 2015-07-27 ENCOUNTER — Ambulatory Visit (HOSPITAL_COMMUNITY)
Admission: RE | Admit: 2015-07-27 | Discharge: 2015-07-27 | Disposition: A | Discharge status: Home / Self Care | Payer: Medicaid Other | Source: Ambulatory Visit | Attending: Obstetrics | Admitting: Obstetrics

## 2015-07-27 DIAGNOSIS — O26843 Uterine size-date discrepancy, third trimester: Secondary | ICD-10-CM | POA: Diagnosis not present

## 2015-07-31 ENCOUNTER — Emergency Department (HOSPITAL_COMMUNITY)
Admission: EM | Admit: 2015-07-31 | Discharge: 2015-07-31 | Disposition: A | Discharge status: Home / Self Care | Payer: Medicaid Other | Attending: Emergency Medicine | Admitting: Emergency Medicine

## 2015-07-31 DIAGNOSIS — Z87891 Personal history of nicotine dependence: Secondary | ICD-10-CM | POA: Diagnosis not present

## 2015-07-31 DIAGNOSIS — Z792 Long term (current) use of antibiotics: Secondary | ICD-10-CM | POA: Diagnosis not present

## 2015-07-31 DIAGNOSIS — Z8701 Personal history of pneumonia (recurrent): Secondary | ICD-10-CM | POA: Insufficient documentation

## 2015-07-31 DIAGNOSIS — K088 Other specified disorders of teeth and supporting structures: Secondary | ICD-10-CM | POA: Insufficient documentation

## 2015-07-31 DIAGNOSIS — K029 Dental caries, unspecified: Secondary | ICD-10-CM | POA: Diagnosis not present

## 2015-07-31 DIAGNOSIS — K0889 Other specified disorders of teeth and supporting structures: Secondary | ICD-10-CM

## 2015-07-31 MED ORDER — AMOXICILLIN 500 MG PO CAPS: 500.0000 mg | ORAL_CAPSULE | Freq: Three times a day (TID) | ORAL | Status: DC

## 2015-07-31 NOTE — ED Notes (Signed)
Pt reports left upper mouth toothache that began yesterday.

## 2015-07-31 NOTE — Discharge Instructions (Signed)

## 2015-07-31 NOTE — ED Provider Notes (Signed)
CSN: [id number]     Arrival date & time 07/31/15  1055 History  This chart was scribed for non-physician practitioner, Delos Haring, PA-C, working with Daleen Bo, MD, by Stephania Fragmin, ED Scribe. This patient was seen in room TR08C/TR08C and the patient's care was started at 12:22 PM.    Chief Complaint  Patient presents with  . Dental Pain   The history is provided by the patient. No language interpreter was used.    HPI Comments: Victoria Harvey 34 y.o.female  PCP: Pcp Not In System  Blood pressure 118/61, pulse 79, temperature 98.1 F (36.7 C), temperature source Oral, resp. rate 16, last menstrual period 11/09/2014, SpO2 100 %.  SIGNIFICANT PMH: Pregnancy 37w; Pt reports NKDA to antibiotics CHIEF COMPLAINT: Dental Pain  When: Intermittent for 2 months; acutely worse yesterday How: She had been treated for this with medications (Pt unable to remember which; she thinks it may have been antibiotics) in past few months, but states her pain returned intermittently for the past 2 months and acutely worsened yesterday. She states she has not tried to contact her dentist because she is pregnant and states she doesn't believe she can have any dental work done.  Frequency: Recurrent Duration: 2 months intermittently Location: Left upper dental pain Radiation: Into left ear Quality: Constant, severe, throbbing Alleviating factors: None Worsening factors: Lying down Treatments tried: Tylenol, with minimal relief Associated Symptoms: N/A Negative ROS: Confusion, diaphoresis, fever, headache, weakness (general or focal), change of vision,  neck pain, dysphagia, aphagia, chest pain, shortness of breath,  back pain, abdominal pains, nausea, vomiting, diarrhea, lower extremity swelling, rash.     Past Medical History  Diagnosis Date  . Pinched nerve     in back; recently completed course of meds for sxs  . Strep sore throat   . Pneumonia    Past Surgical History  Procedure Laterality  Date  . No past surgeries    . Wisdom tooth extraction     Family History  Problem Relation Age of Onset  . Hypertension Mother   . Hypertension Father    Social History  Substance Use Topics  . Smoking status: Former Smoker -- 0.00 packs/day    Types: Cigarettes    Quit date: 11/24/2014  . Smokeless tobacco: Never Used  . Alcohol Use: No   OB History    Gravida Para Term Preterm AB TAB SAB Ectopic Multiple Living   2 1 1       1      Review of Systems A complete 10 system review of systems was obtained and all systems are negative except as noted in the HPI and PMH.    Allergies  Review of patient's allergies indicates no known allergies.  Home Medications   Prior to Admission medications   Medication Sig Start Date End Date Taking? Authorizing Provider  acetaminophen (TYLENOL) 500 MG tablet Take 500-1,000 mg by mouth every 6 (six) hours as needed for mild pain.    Historical Provider, MD  amoxicillin (AMOXIL) 500 MG capsule Take 1 capsule (500 mg total) by mouth 3 (three) times daily. 07/31/15   Dollye Glasser Carlota Raspberry, PA-C  cephALEXin (KEFLEX) 250 MG capsule Take 1 capsule (250 mg total) by mouth 4 (four) times daily. 06/06/15   Julianne Rice, MD  HYDROcodone-acetaminophen (NORCO) 5-325 MG per tablet Take 1 tablet by mouth every 6 (six) hours as needed for severe pain. 06/06/15   Julianne Rice, MD  Prenatal Vit-Fe Fumarate-FA (PRENATAL MULTIVITAMIN) TABS tablet Take 1  tablet by mouth daily at 12 noon.    Historical Provider, MD   BP 118/61 mmHg  Pulse 79  Temp(Src) 98.1 F (36.7 C) (Oral)  Resp 16  SpO2 100%  LMP 11/09/2014 Physical Exam  Constitutional: She is oriented to person, place, and time. She appears well-developed and well-nourished. No distress.  HENT:  Head: Normocephalic and atraumatic.  Mouth/Throat: Uvula is midline, oropharynx is clear and moist and mucous membranes are normal. No oral lesions. No trismus in the jaw. Normal dentition. Dental caries (Pts tooth  shows no obvious abscess but moderate to severe tenderness to palpation of marked tooth) present. No dental abscesses, uvula swelling or lacerations.    Eyes: Conjunctivae and EOM are normal. Pupils are equal, round, and reactive to light.  Neck: Trachea normal, normal range of motion and full passive range of motion without pain. Neck supple. No tracheal deviation present.  Cardiovascular: Normal rate, regular rhythm, normal heart sounds and normal pulses.   Pulmonary/Chest: Effort normal and breath sounds normal. No respiratory distress. Chest wall is not dull to percussion. She exhibits no tenderness, no crepitus, no edema, no deformity and no retraction.  Abdominal: Normal appearance.  Musculoskeletal: Normal range of motion.  Neurological: She is alert and oriented to person, place, and time. She has normal strength.  Skin: Skin is warm, dry and intact. She is not diaphoretic.  Psychiatric: She has a normal mood and affect. Her speech is normal and behavior is normal. Cognition and memory are normal.  Nursing note and vitals reviewed.   ED Course  Procedures (including critical care time)  DIAGNOSTIC STUDIES: Oxygen Saturation is 100% on RA, normal by my interpretation.    COORDINATION OF CARE: 12:28 PM - Discussed treatment plan with pt at bedside which includes dental block. Pt verbalized understanding and agreed to plan.   MDM   Final diagnoses:  Pain, dental    NERVE BLOCK Date/Time: 07/31/2015 at 12: 45 pm Performed by: Linus Mako Authorized by: Linus Mako Consent: Verbal consent obtained. Risks and benefits: risks, benefits and alternatives were discussed Consent given by: patient Indications: pain relief Body area: face/mouth Laterality: left Needle gauge: 25 G Local anesthetic: lidocaine 2% without epinephrine Anesthetic total: 2 ml Outcome: pain improved Patient tolerance: Patient tolerated the procedure well with no immediate  complications. Comments: Patient had complete relief of pain.  No emergent s/sx's present. Patent airway. No trismus.  No neck tenderness or protrusion of tongue or floor of mouth.  rx Amoxicillin, Tylenol for pain. Referral to on call Dr. Mariel Sleet (dentist)  Medications - No data to display  34 y.o.Victoria Harvey evaluation in the Emergency Department is complete. It has been determined that no acute conditions requiring further emergency intervention are present at this time. The patient/guardian have been advised of the diagnosis and plan. We have discussed signs and symptoms that warrant return to the ED, such as changes or worsening in symptoms.  Vital signs are stable at discharge. Filed Vitals:   07/31/15 1115  BP: 118/61  Pulse: 79  Temp: 98.1 F (36.7 C)  Resp: 16    Patient/guardian has voiced understanding and agreed to follow-up with the PCP or specialist.    Delos Haring, PA-C 07/31/15 [place], MD 07/31/15 [phone]

## 2015-08-15 ENCOUNTER — Telehealth (HOSPITAL_COMMUNITY): Payer: Self-pay | Admitting: *Deleted

## 2015-08-15 ENCOUNTER — Encounter (HOSPITAL_COMMUNITY): Payer: Self-pay | Admitting: *Deleted

## 2015-08-15 NOTE — Telephone Encounter (Signed)
Preadmission screen  

## 2015-08-18 ENCOUNTER — Inpatient Hospital Stay (HOSPITAL_COMMUNITY): Admission: RE | Admit: 2015-08-18 | Payer: Medicaid Other | Source: Ambulatory Visit

## 2015-08-19 ENCOUNTER — Encounter (HOSPITAL_COMMUNITY): Payer: Self-pay

## 2015-08-19 ENCOUNTER — Inpatient Hospital Stay (HOSPITAL_COMMUNITY): Payer: Medicaid Other | Admitting: Anesthesiology

## 2015-08-19 ENCOUNTER — Inpatient Hospital Stay (HOSPITAL_COMMUNITY)
Admission: RE | Admit: 2015-08-19 | Discharge: 2015-08-21 | DRG: 775 | Disposition: A | Discharge status: Home / Self Care | Payer: Medicaid Other | Source: Ambulatory Visit | Attending: Obstetrics | Admitting: Obstetrics

## 2015-08-19 VITALS — BP 122/64 | HR 65 | Temp 98.3°F | Resp 18 | Ht 67.0 in | Wt 172.0 lb

## 2015-08-19 DIAGNOSIS — O480 Post-term pregnancy: Principal | ICD-10-CM | POA: Diagnosis present

## 2015-08-19 DIAGNOSIS — Z3A40 40 weeks gestation of pregnancy: Secondary | ICD-10-CM | POA: Diagnosis present

## 2015-08-19 DIAGNOSIS — R109 Unspecified abdominal pain: Secondary | ICD-10-CM

## 2015-08-19 DIAGNOSIS — Z87891 Personal history of nicotine dependence: Secondary | ICD-10-CM | POA: Diagnosis not present

## 2015-08-19 DIAGNOSIS — O99824 Streptococcus B carrier state complicating childbirth: Secondary | ICD-10-CM | POA: Diagnosis present

## 2015-08-19 DIAGNOSIS — O26899 Other specified pregnancy related conditions, unspecified trimester: Secondary | ICD-10-CM

## 2015-08-19 LAB — CBC
HCT: 33.9 % — ABNORMAL LOW (ref 36.0–46.0)
Hemoglobin: 11.2 g/dL — ABNORMAL LOW (ref 12.0–15.0)
MCH: 29.6 pg (ref 26.0–34.0)
MCHC: 33 g/dL (ref 30.0–36.0)
MCV: 89.7 fL (ref 78.0–100.0)
Platelets: 287 10*3/uL (ref 150–400)
RBC: 3.78 MIL/uL — ABNORMAL LOW (ref 3.87–5.11)
RDW: 15.8 % — ABNORMAL HIGH (ref 11.5–15.5)
WBC: 7.1 10*3/uL (ref 4.0–10.5)

## 2015-08-19 LAB — TYPE AND SCREEN
ABO/RH(D): O POS
Antibody Screen: NEGATIVE

## 2015-08-19 LAB — ABO/RH: ABO/RH(D): O POS

## 2015-08-19 LAB — MRSA PCR SCREENING: MRSA by PCR: NEGATIVE

## 2015-08-19 MED ORDER — FENTANYL 2.5 MCG/ML BUPIVACAINE 1/10 % EPIDURAL INFUSION (WH - ANES)
14.0000 mL/h | INTRAMUSCULAR | Status: DC | PRN
  Administered 2015-08-19: 14 mL/h via EPIDURAL
  Filled 2015-08-19: qty 125

## 2015-08-19 MED ORDER — PHENYLEPHRINE 40 MCG/ML (10ML) SYRINGE FOR IV PUSH (FOR BLOOD PRESSURE SUPPORT)
80.0000 ug | PREFILLED_SYRINGE | INTRAVENOUS | Status: DC | PRN
Start: 2015-08-19 — End: 2015-08-20
  Filled 2015-08-19: qty 2
  Filled 2015-08-19: qty 20

## 2015-08-19 MED ORDER — ONDANSETRON HCL 4 MG/2ML IJ SOLN: 4.0000 mg | Freq: Four times a day (QID) | INTRAMUSCULAR | Status: DC | PRN

## 2015-08-19 MED ORDER — OXYTOCIN 40 UNITS IN LACTATED RINGERS INFUSION - SIMPLE MED: 62.5000 mL/h | Freq: Once | INTRAVENOUS | Status: DC | PRN

## 2015-08-19 MED ORDER — TERBUTALINE SULFATE 1 MG/ML IJ SOLN
0.2500 mg | Freq: Once | INTRAMUSCULAR | Status: DC | PRN
Start: 2015-08-19 — End: 2015-08-20
  Filled 2015-08-19: qty 1

## 2015-08-19 MED ORDER — EPHEDRINE 5 MG/ML INJ
10.0000 mg | INTRAVENOUS | Status: DC | PRN
  Filled 2015-08-19: qty 2

## 2015-08-19 MED ORDER — OXYCODONE-ACETAMINOPHEN 5-325 MG PO TABS
1.0000 | ORAL_TABLET | ORAL | Status: DC | PRN
  Filled 2015-08-19: qty 1

## 2015-08-19 MED ORDER — PENICILLIN G POTASSIUM 5000000 UNITS IJ SOLR
2.5000 10*6.[IU] | INTRAVENOUS | Status: DC
  Administered 2015-08-19 (×2): 2.5 10*6.[IU] via INTRAVENOUS
  Filled 2015-08-19 (×7): qty 2.5

## 2015-08-19 MED ORDER — LACTATED RINGERS IV SOLN
INTRAVENOUS | Status: DC
  Administered 2015-08-19 (×3): via INTRAVENOUS

## 2015-08-19 MED ORDER — PENICILLIN G POTASSIUM 5000000 UNITS IJ SOLR
5.0000 10*6.[IU] | Freq: Once | INTRAVENOUS | Status: AC
  Administered 2015-08-19: 5 10*6.[IU] via INTRAVENOUS
  Filled 2015-08-19: qty 5

## 2015-08-19 MED ORDER — OXYTOCIN 40 UNITS IN LACTATED RINGERS INFUSION - SIMPLE MED
1.0000 m[IU]/min | INTRAVENOUS | Status: DC
  Administered 2015-08-19: 2 m[IU]/min via INTRAVENOUS
  Filled 2015-08-19: qty 1000

## 2015-08-19 MED ORDER — LACTATED RINGERS IV SOLN
500.0000 mL | INTRAVENOUS | Status: DC | PRN
  Administered 2015-08-19: 1000 mL via INTRAVENOUS

## 2015-08-19 MED ORDER — CITRIC ACID-SODIUM CITRATE 334-500 MG/5ML PO SOLN: 30.0000 mL | ORAL | Status: DC | PRN

## 2015-08-19 MED ORDER — LIDOCAINE HCL (PF) 1 % IJ SOLN
30.0000 mL | INTRAMUSCULAR | Status: DC | PRN
  Filled 2015-08-19: qty 30

## 2015-08-19 MED ORDER — DIPHENHYDRAMINE HCL 50 MG/ML IJ SOLN: 12.5000 mg | INTRAMUSCULAR | Status: DC | PRN

## 2015-08-19 MED ORDER — OXYTOCIN BOLUS FROM INFUSION
500.0000 mL | Freq: Once | INTRAVENOUS | Status: DC | PRN
  Administered 2015-08-19: 500 mL via INTRAVENOUS

## 2015-08-19 MED ORDER — BUTORPHANOL TARTRATE 1 MG/ML IJ SOLN: 1.0000 mg | INTRAMUSCULAR | Status: DC | PRN

## 2015-08-19 MED ORDER — ACETAMINOPHEN 325 MG PO TABS
650.0000 mg | ORAL_TABLET | ORAL | Status: DC | PRN
Start: 2015-08-19 — End: 2015-08-20

## 2015-08-19 MED ORDER — LIDOCAINE HCL (PF) 1 % IJ SOLN
INTRAMUSCULAR | Status: DC | PRN
  Administered 2015-08-19 (×2): 8 mL via EPIDURAL

## 2015-08-19 MED ORDER — OXYCODONE-ACETAMINOPHEN 5-325 MG PO TABS: 2.0000 | ORAL_TABLET | ORAL | Status: DC | PRN

## 2015-08-19 NOTE — Anesthesia Preprocedure Evaluation (Signed)
Anesthesia Evaluation  Patient identified by MRN, date of birth, ID band Patient awake    Reviewed: Allergy & Precautions, H&P , NPO status , Patient's Chart, lab work & pertinent test results  Airway Mallampati: I  TM Distance: >3 FB Neck ROM: full    Dental no notable dental hx.    Pulmonary former smoker,    Pulmonary exam normal        Cardiovascular negative cardio ROS Normal cardiovascular exam     Neuro/Psych negative neurological ROS  negative psych ROS   GI/Hepatic negative GI ROS, Neg liver ROS,   Endo/Other  negative endocrine ROS  Renal/GU negative Renal ROS     Musculoskeletal   Abdominal Normal abdominal exam  (+)   Peds  Hematology negative hematology ROS (+)   Anesthesia Other Findings   Reproductive/Obstetrics (+) Pregnancy                             Anesthesia Physical Anesthesia Plan  ASA: II  Anesthesia Plan: Epidural   Post-op Pain Management:    Induction:   Airway Management Planned:   Additional Equipment:   Intra-op Plan:   Post-operative Plan:   Informed Consent: I have reviewed the patients History and Physical, chart, labs and discussed the procedure including the risks, benefits and alternatives for the proposed anesthesia with the patient or authorized representative who has indicated his/her understanding and acceptance.     Plan Discussed with:   Anesthesia Plan Comments:         Anesthesia Quick Evaluation

## 2015-08-19 NOTE — H&P (Signed)
This is Dr. Gracy Racer dictating the history and physical on  Victoria Harvey  she's a 34 year old gravida 2 para 101 at 40 weeks and 2 days positive GBS got penicillin EDC 08/17/2015 desires induction she is on low-dose Pitocin and her cervix is 2 cm 70% vertex at -1 station amniotomy performed the fluids clear Past medical history negative Past surgical history negative Social history negative System review negative Physical exam well-developed female in early labor HEENT negative Lungs clear to P&A Heart regular rhythm no murmurs no gallops Breasts negative Abdomen term Pelvic as described above Extremities negative

## 2015-08-19 NOTE — Anesthesia Procedure Notes (Signed)
Epidural Patient location during procedure: OB Start time: 08/19/2015 4:55 PM End time: 08/19/2015 4:59 PM  Staffing Anesthesiologist: Lyn Hollingshead Performed by: anesthesiologist   Preanesthetic Checklist Completed: patient identified, surgical consent, pre-op evaluation, timeout performed, IV checked, risks and benefits discussed and monitors and equipment checked  Epidural Patient position: sitting Prep: site prepped and draped and DuraPrep Patient monitoring: continuous pulse ox and blood pressure Approach: midline Location: L3-L4 Injection technique: LOR air  Needle:  Needle type: Tuohy  Needle gauge: 17 G Needle length: 9 cm and 9 Needle insertion depth: 5 cm cm Catheter type: closed end flexible Catheter size: 19 Gauge Catheter at skin depth: 10 cm Test dose: negative and Other  Assessment Sensory level: T9 Events: blood not aspirated, injection not painful, no injection resistance, negative IV test and no paresthesia  Additional Notes Reason for block:procedure for pain

## 2015-08-20 LAB — CBC
HCT: 30.8 % — ABNORMAL LOW (ref 36.0–46.0)
Hemoglobin: 10.3 g/dL — ABNORMAL LOW (ref 12.0–15.0)
MCH: 29.7 pg (ref 26.0–34.0)
MCHC: 33.4 g/dL (ref 30.0–36.0)
MCV: 88.8 fL (ref 78.0–100.0)
Platelets: 241 10*3/uL (ref 150–400)
RBC: 3.47 MIL/uL — ABNORMAL LOW (ref 3.87–5.11)
RDW: 15.7 % — ABNORMAL HIGH (ref 11.5–15.5)
WBC: 12 10*3/uL — ABNORMAL HIGH (ref 4.0–10.5)

## 2015-08-20 LAB — RPR: RPR Ser Ql: NONREACTIVE

## 2015-08-20 MED ORDER — FERROUS SULFATE 325 (65 FE) MG PO TABS
325.0000 mg | ORAL_TABLET | Freq: Two times a day (BID) | ORAL | Status: DC
  Administered 2015-08-20 – 2015-08-21 (×3): 325 mg via ORAL
  Filled 2015-08-20 (×3): qty 1

## 2015-08-20 MED ORDER — LANOLIN HYDROUS EX OINT: TOPICAL_OINTMENT | CUTANEOUS | Status: DC | PRN

## 2015-08-20 MED ORDER — OXYCODONE-ACETAMINOPHEN 5-325 MG PO TABS: 2.0000 | ORAL_TABLET | ORAL | Status: DC | PRN

## 2015-08-20 MED ORDER — SIMETHICONE 80 MG PO CHEW: 80.0000 mg | CHEWABLE_TABLET | ORAL | Status: DC | PRN

## 2015-08-20 MED ORDER — OXYCODONE-ACETAMINOPHEN 5-325 MG PO TABS
1.0000 | ORAL_TABLET | ORAL | Status: DC | PRN
Start: 2015-08-20 — End: 2015-08-21
  Administered 2015-08-20: 1 via ORAL

## 2015-08-20 MED ORDER — DIBUCAINE 1 % RE OINT: 1.0000 "application " | TOPICAL_OINTMENT | RECTAL | Status: DC | PRN

## 2015-08-20 MED ORDER — BENZOCAINE-MENTHOL 20-0.5 % EX AERO: 1.0000 "application " | INHALATION_SPRAY | CUTANEOUS | Status: DC | PRN

## 2015-08-20 MED ORDER — SENNOSIDES-DOCUSATE SODIUM 8.6-50 MG PO TABS
2.0000 | ORAL_TABLET | ORAL | Status: DC
  Administered 2015-08-20 – 2015-08-21 (×2): 2 via ORAL
  Filled 2015-08-20 (×2): qty 2

## 2015-08-20 MED ORDER — ONDANSETRON HCL 4 MG/2ML IJ SOLN: 4.0000 mg | INTRAMUSCULAR | Status: DC | PRN

## 2015-08-20 MED ORDER — IBUPROFEN 600 MG PO TABS
600.0000 mg | ORAL_TABLET | Freq: Four times a day (QID) | ORAL | Status: DC
  Administered 2015-08-20 – 2015-08-21 (×6): 600 mg via ORAL
  Filled 2015-08-20 (×6): qty 1

## 2015-08-20 MED ORDER — ZOLPIDEM TARTRATE 5 MG PO TABS: 5.0000 mg | ORAL_TABLET | Freq: Every evening | ORAL | Status: DC | PRN

## 2015-08-20 MED ORDER — TETANUS-DIPHTH-ACELL PERTUSSIS 5-2.5-18.5 LF-MCG/0.5 IM SUSP
0.5000 mL | Freq: Once | INTRAMUSCULAR | Status: AC
  Administered 2015-08-20: 0.5 mL via INTRAMUSCULAR
  Filled 2015-08-20: qty 0.5

## 2015-08-20 MED ORDER — WITCH HAZEL-GLYCERIN EX PADS: 1.0000 "application " | MEDICATED_PAD | CUTANEOUS | Status: DC | PRN

## 2015-08-20 MED ORDER — ONDANSETRON HCL 4 MG PO TABS: 4.0000 mg | ORAL_TABLET | ORAL | Status: DC | PRN

## 2015-08-20 MED ORDER — PRENATAL MULTIVITAMIN CH
1.0000 | ORAL_TABLET | Freq: Every day | ORAL | Status: DC
  Administered 2015-08-20: 1 via ORAL
  Filled 2015-08-20: qty 1

## 2015-08-20 MED ORDER — DIPHENHYDRAMINE HCL 25 MG PO CAPS: 25.0000 mg | ORAL_CAPSULE | Freq: Four times a day (QID) | ORAL | Status: DC | PRN

## 2015-08-20 MED ORDER — ACETAMINOPHEN 325 MG PO TABS: 650.0000 mg | ORAL_TABLET | ORAL | Status: DC | PRN

## 2015-08-20 NOTE — Progress Notes (Signed)
Patient ID: Victoria Harvey, female   DOB: 1981-08-28, 34 y.o.   MRN: [id number] Postpartum day one Impression 010 5/45 respiration 20 pulse 73 Fundus firm Lochia moderate Legs negative doing well

## 2015-08-20 NOTE — Lactation Note (Signed)
This note was copied from the chart of Victoria Harvey. Lactation Consultation Note  Patient Name: Victoria Harvey [id number] Date: 08/20/2015 Reason for consult: Initial assessment;Infant < 6lbs Mom reports she feels this baby is nursing well, starting to be more interested in BF and nursing for longer periods. Basic teaching reviewed with Mom. On admission Mom planned BR/BO, supplemental guidelines per hours of age reviewed with Mom. Mom reports baby would not take bottle today. Discussed other methods to supplement, Mom prefers bottle if supplements. Advised Mom if baby satisfied at the breast, nursing 8-12 times or more in 24 hours for 15-20 minutes, she may not need supplement. Lactation brochure left for review, advised of OP services and support group. Encouraged to call for assist as needed.   Maternal Data Formula Feeding for Exclusion: Yes Reason for exclusion: Mother's choice to formula and breast feed on admission Has patient been taught Hand Expression?: Yes Does the patient have breastfeeding experience prior to this delivery?: Yes  Feeding Feeding Type: Breast Fed Length of feed: 20 min  LATCH Score/Interventions                      Lactation Tools Discussed/Used WIC Program: Yes   Consult Status Consult Status: Follow-up Date: 08/21/15 Follow-up type: In-patient    Katrine Coho 08/20/2015, 8:26 PM

## 2015-08-20 NOTE — Anesthesia Postprocedure Evaluation (Signed)
  Anesthesia Post-op Note  Patient: Victoria Harvey  Procedure(s) Performed: * No procedures listed *  Patient Location: Mother/Baby  Anesthesia Type:Epidural  Level of Consciousness: awake, alert  and oriented  Airway and Oxygen Therapy: Patient Spontanous Breathing  Post-op Pain: none  Post-op Assessment: Post-op Vital signs reviewed and Patient's Cardiovascular Status Stable              Post-op Vital Signs: Reviewed and stable  Last Vitals:  Filed Vitals:   08/20/15 0530  BP: 106/45  Pulse: 73  Temp: 36.8 C  Resp: 20    Complications: No apparent anesthesia complications

## 2015-08-21 NOTE — Discharge Instructions (Signed)
Discharge instructions   You can wash your hair  Shower  Eat what you want  Drink what you want  See me in 6 weeks  Your ankles are going to swell more in the next 2 weeks than when pregnant  No sex for 6 weeks   Kyaire Gruenewald A, MD 08/21/2015

## 2015-08-21 NOTE — Lactation Note (Signed)
This note was copied from the chart of Girl Yuko Coventry. Lactation Consultation Note Follow up visit at 36 hours of age.   Mom reports being concerned about baby not staying active for breast feedings and plans to continue formula feeding after breast attempts.  Mom was given hand pump by Rn and mom reports she is aware of how to use pump.  Mom has recently given a bottle and declines assist with latch at this time.  She is ready for discharge.  Mom aware of progression of milk supply and how to care for engorgement as needed. Mom aware of support group and o/p services.     Patient Name: Girl Mida Cory [id number] Date: 08/21/2015 Reason for consult: Follow-up assessment   Maternal Data    Feeding Feeding Type: Bottle Fed - Formula Nipple Type: Slow - flow Length of feed: 20 min  LATCH Score/Interventions                Intervention(s): Breastfeeding basics reviewed     Lactation Tools Discussed/Used     Consult Status Consult Status: Complete    Shoptaw, Justine Null 08/21/2015, 11:05 AM

## 2015-08-21 NOTE — Progress Notes (Signed)
Patient ID: Victoria Harvey, female   DOB: 12-21-1980, 34 y.o.   MRN: [id number] Postpartum day 2 Blood pressure 122/64 respiration 18 pulse 65 afebrile Fundus firm Moderate lochia Legs negative Doing well home today

## 2015-08-21 NOTE — Discharge Summary (Signed)
Obstetric Discharge Summary Reason for Admission: induction of labor Prenatal Procedures: none Intrapartum Procedures: spontaneous vaginal delivery Postpartum Procedures: none Complications-Operative and Postpartum: none HEMOGLOBIN  Date Value Ref Range Status  08/20/2015 10.3* 12.0 - 15.0 g/dL Final   HCT  Date Value Ref Range Status  08/20/2015 30.8* 36.0 - 46.0 % Final    Physical Exam:  General: alert Lochia: appropriate Uterine Fundus: firm Incision: healing well DVT Evaluation: No evidence of DVT seen on physical exam.  Discharge Diagnoses: Term Pregnancy-delivered  Discharge Information: Date: 08/21/2015 Activity: pelvic rest Diet: routine Medications: Percocet Condition: improved Instructions: refer to practice specific booklet Discharge to: home Follow-up Information    Follow up with Frederico Hamman, MD.   Specialty:  Obstetrics and Gynecology   Contact information:   [address] [place] [address] [phone]       Newborn Data: Live born female  Birth Weight: 5 lb 11.9 oz (2605 g) APGAR: 9, 9  Home with mother.  MARSHALL,BERNARD A 08/21/2015, 6:35 AM

## 2015-08-30 ENCOUNTER — Encounter (HOSPITAL_COMMUNITY): Payer: Self-pay | Admitting: *Deleted

## 2015-08-30 ENCOUNTER — Inpatient Hospital Stay (HOSPITAL_COMMUNITY)
Admission: AD | Admit: 2015-08-30 | Discharge: 2015-08-30 | Disposition: A | Discharge status: Home / Self Care | Payer: Medicaid Other | Source: Ambulatory Visit | Attending: Obstetrics | Admitting: Obstetrics

## 2015-08-30 DIAGNOSIS — I10 Essential (primary) hypertension: Secondary | ICD-10-CM | POA: Diagnosis present

## 2015-08-30 DIAGNOSIS — K5900 Constipation, unspecified: Secondary | ICD-10-CM | POA: Insufficient documentation

## 2015-08-30 DIAGNOSIS — Z87891 Personal history of nicotine dependence: Secondary | ICD-10-CM | POA: Insufficient documentation

## 2015-08-30 DIAGNOSIS — R300 Dysuria: Secondary | ICD-10-CM | POA: Diagnosis present

## 2015-08-30 DIAGNOSIS — O9989 Other specified diseases and conditions complicating pregnancy, childbirth and the puerperium: Secondary | ICD-10-CM | POA: Insufficient documentation

## 2015-08-30 DIAGNOSIS — O139 Gestational [pregnancy-induced] hypertension without significant proteinuria, unspecified trimester: Secondary | ICD-10-CM

## 2015-08-30 DIAGNOSIS — O872 Hemorrhoids in the puerperium: Secondary | ICD-10-CM | POA: Insufficient documentation

## 2015-08-30 DIAGNOSIS — K644 Residual hemorrhoidal skin tags: Secondary | ICD-10-CM

## 2015-08-30 LAB — COMPREHENSIVE METABOLIC PANEL
ALT: 16 U/L (ref 14–54)
AST: 9 U/L — ABNORMAL LOW (ref 15–41)
Albumin: 3.6 g/dL (ref 3.5–5.0)
Alkaline Phosphatase: 76 U/L (ref 38–126)
Anion gap: 7 (ref 5–15)
BUN: 13 mg/dL (ref 6–20)
CO2: 26 mmol/L (ref 22–32)
Calcium: 8.7 mg/dL — ABNORMAL LOW (ref 8.9–10.3)
Chloride: 105 mmol/L (ref 101–111)
Creatinine, Ser: 0.87 mg/dL (ref 0.44–1.00)
GFR calc Af Amer: >60 mL/min (ref >60)
GFR calc non Af Amer: >60 mL/min (ref >60)
Glucose, Bld: 81 mg/dL (ref 65–99)
Potassium: 4 mmol/L (ref 3.5–5.1)
Sodium: 138 mmol/L (ref 135–145)
Total Bilirubin: 0.5 mg/dL (ref 0.3–1.2)
Total Protein: 7.7 g/dL (ref 6.5–8.1)

## 2015-08-30 LAB — LACTATE DEHYDROGENASE: LDH: 211 U/L — ABNORMAL HIGH (ref 98–192)

## 2015-08-30 LAB — CBC
HCT: 39.4 % (ref 36.0–46.0)
Hemoglobin: 12.9 g/dL (ref 12.0–15.0)
MCH: 29.4 pg (ref 26.0–34.0)
MCHC: 32.7 g/dL (ref 30.0–36.0)
MCV: 89.7 fL (ref 78.0–100.0)
Platelets: 376 10*3/uL (ref 150–400)
RBC: 4.39 MIL/uL (ref 3.87–5.11)
RDW: 15.9 % — ABNORMAL HIGH (ref 11.5–15.5)
WBC: 6.1 10*3/uL (ref 4.0–10.5)

## 2015-08-30 LAB — URINALYSIS, ROUTINE W REFLEX MICROSCOPIC
Bilirubin Urine: NEGATIVE
Glucose, UA: NEGATIVE mg/dL
Ketones, ur: NEGATIVE mg/dL
Nitrite: NEGATIVE
Protein, ur: NEGATIVE mg/dL
Specific Gravity, Urine: 1.025 (ref 1.005–1.030)
Urobilinogen, UA: 0.2 mg/dL (ref 0.0–1.0)
pH: 6 (ref 5.0–8.0)

## 2015-08-30 LAB — URINE MICROSCOPIC-ADD ON

## 2015-08-30 LAB — PROTEIN / CREATININE RATIO, URINE
Creatinine, Urine: 175 mg/dL
Protein Creatinine Ratio: 0.11 mg/mg{Cre} (ref 0.00–0.15)
Total Protein, Urine: 20 mg/dL

## 2015-08-30 LAB — URIC ACID: Uric Acid, Serum: 7.4 mg/dL — ABNORMAL HIGH (ref 2.3–6.6)

## 2015-08-30 MED ORDER — HYDROCORTISONE ACE-PRAMOXINE 1-1 % RE FOAM: 1.0000 | Freq: Two times a day (BID) | RECTAL | Status: DC

## 2015-08-30 MED ORDER — HYDROCORTISONE ACE-PRAMOXINE 1-1 % RE FOAM
1.0000 | Freq: Two times a day (BID) | RECTAL | Status: DC
  Administered 2015-08-30: 1 via RECTAL
  Filled 2015-08-30: qty 10

## 2015-08-30 NOTE — Discharge Instructions (Signed)
Constipation °Constipation is when a person has fewer than three bowel movements a week, has difficulty having a bowel movement, or has stools that are dry, hard, or larger than normal. As people grow older, constipation is more common. If you try to fix constipation with medicines that make you have a bowel movement (laxatives), the problem may get worse. Long-term laxative use may cause the muscles of the colon to become weak. A low-fiber diet, not taking in enough fluids, and taking certain medicines may make constipation worse.  °CAUSES  °· Certain medicines, such as antidepressants, pain medicine, iron supplements, antacids, and water pills.   °· Certain diseases, such as diabetes, irritable bowel syndrome (IBS), thyroid disease, or depression.   °· Not drinking enough water.   °· Not eating enough fiber-rich foods.   °· Stress or travel.   °· Lack of physical activity or exercise.   °· Ignoring the urge to have a bowel movement.   °· Using laxatives too much.   °SIGNS AND SYMPTOMS  °· Having fewer than three bowel movements a week.   °· Straining to have a bowel movement.   °· Having stools that are hard, dry, or larger than normal.   °· Feeling full or bloated.   °· Pain in the lower abdomen.   °· Not feeling relief after having a bowel movement.   °DIAGNOSIS  °Your health care provider will take a medical history and perform a physical exam. Further testing may be done for severe constipation. Some tests may include: °· A barium enema X-ray to examine your rectum, colon, and, sometimes, your small intestine.   °· A sigmoidoscopy to examine your lower colon.   °· A colonoscopy to examine your entire colon. °TREATMENT  °Treatment will depend on the severity of your constipation and what is causing it. Some dietary treatments include drinking more fluids and eating more fiber-rich foods. Lifestyle treatments may include regular exercise. If these diet and lifestyle recommendations do not help, your health care  provider may recommend taking over-the-counter laxative medicines to help you have bowel movements. Prescription medicines may be prescribed if over-the-counter medicines do not work.  °HOME CARE INSTRUCTIONS  °· Eat foods that have a lot of fiber, such as fruits, vegetables, whole grains, and beans. °· Limit foods high in fat and processed sugars, such as french fries, hamburgers, cookies, candies, and soda.   °· A fiber supplement may be added to your diet if you cannot get enough fiber from foods.   °· Drink enough fluids to keep your urine clear or pale yellow.   °· Exercise regularly or as directed by your health care provider.   °· Go to the restroom when you have the urge to go. Do not hold it.   °· Only take over-the-counter or prescription medicines as directed by your health care provider. Do not take other medicines for constipation without talking to your health care provider first.   °SEEK IMMEDIATE MEDICAL CARE IF:  °· You have bright red blood in your stool.   °· Your constipation lasts for more than 4 days or gets worse.   °· You have abdominal or rectal pain.   °· You have thin, pencil-like stools.   °· You have unexplained weight loss. °MAKE SURE YOU:  °· Understand these instructions. °· Will watch your condition. °· Will get help right away if you are not doing well or get worse. °Document Released: 08/16/2004 Document Revised: 11/23/2013 Document Reviewed: 08/30/2013 °ExitCare® Patient Information ©2015 [employer]. This information is not intended to replace advice given to you by your health care provider. Make sure you discuss any questions   you have with your health care provider. Hemorrhoids Hemorrhoids are swollen veins around the rectum or anus. There are two types of hemorrhoids:   Internal hemorrhoids. These occur in the veins just inside the rectum. They may poke through to the outside and become irritated and painful.  External hemorrhoids. These occur in the veins outside the  anus and can be felt as a painful swelling or hard lump near the anus. CAUSES  Pregnancy.   Obesity.   Constipation or diarrhea.   Straining to have a bowel movement.   Sitting for long periods on the toilet.  Heavy lifting or other activity that caused you to strain.  Anal intercourse. SYMPTOMS   Pain.   Anal itching or irritation.   Rectal bleeding.   Fecal leakage.   Anal swelling.   One or more lumps around the anus.  DIAGNOSIS  Your caregiver may be able to diagnose hemorrhoids by visual examination. Other examinations or tests that may be performed include:   Examination of the rectal area with a gloved hand (digital rectal exam).   Examination of anal canal using a small tube (scope).   A blood test if you have lost a significant amount of blood.  A test to look inside the colon (sigmoidoscopy or colonoscopy). TREATMENT Most hemorrhoids can be treated at home. However, if symptoms do not seem to be getting better or if you have a lot of rectal bleeding, your caregiver may perform a procedure to help make the hemorrhoids get smaller or remove them completely. Possible treatments include:   Placing a rubber band at the base of the hemorrhoid to cut off the circulation (rubber band ligation).   Injecting a chemical to shrink the hemorrhoid (sclerotherapy).   Using a tool to burn the hemorrhoid (infrared light therapy).   Surgically removing the hemorrhoid (hemorrhoidectomy).   Stapling the hemorrhoid to block blood flow to the tissue (hemorrhoid stapling).  HOME CARE INSTRUCTIONS   Eat foods with fiber, such as whole grains, beans, nuts, fruits, and vegetables. Ask your doctor about taking products with added fiber in them (fibersupplements).  Increase fluid intake. Drink enough water and fluids to keep your urine clear or pale yellow.   Exercise regularly.   Go to the bathroom when you have the urge to have a bowel movement. Do not  wait.   Avoid straining to have bowel movements.   Keep the anal area dry and clean. Use wet toilet paper or moist towelettes after a bowel movement.   Medicated creams and suppositories may be used or applied as directed.   Only take over-the-counter or prescription medicines as directed by your caregiver.   Take warm sitz baths for 15-20 minutes, 3-4 times a day to ease pain and discomfort.   Place ice packs on the hemorrhoids if they are tender and swollen. Using ice packs between sitz baths may be helpful.   Put ice in a plastic bag.   Place a towel between your skin and the bag.   Leave the ice on for 15-20 minutes, 3-4 times a day.   Do not use a donut-shaped pillow or sit on the toilet for long periods. This increases blood pooling and pain.  SEEK MEDICAL CARE IF:  You have increasing pain and swelling that is not controlled by treatment or medicine.  You have uncontrolled bleeding.  You have difficulty or you are unable to have a bowel movement.  You have pain or inflammation outside the area of the   hemorrhoids. MAKE SURE YOU:  Understand these instructions.  Will watch your condition.  Will get help right away if you are not doing well or get worse. Document Released: 11/15/2000 Document Revised: 11/04/2012 Document Reviewed: 09/22/2012 ExitCare Patient Information 2015 [employer]. This information is not intended to replace advice given to you by your health care provider. Make sure you discuss any questions you have with your health care provider.  

## 2015-08-30 NOTE — MAU Provider Note (Signed)
History     CSN: [id number]  Arrival date and time: 08/30/15 1709   First Provider Initiated Contact with Patient 08/30/15 1804      Chief Complaint  Patient presents with  . Dysuria  . Hypertension   HPI Victoria Harvey 34 y.o. [id number] that had vaginal delivery 6/38 without complications reports to MAU today for pain with urination and rectal pain.  This has been noticed over the last week maybe.   With urination, she feels like her uterus is going to fall out.  At rest, she does not have this sensation.  She denies burning, incomplete emptying, abdominal pain, back pain.  The rectum hurts all the time.  No hemorrhoids in her life up until 2 days ago.  She has not used anything to treat.  She has not had a bowel movement in 2 days.  She states there is a stool softener in her vitamins.  No prior h/o blood pressure problems - but elevation noted on triage.  She denies vision changes, HA, swelling, epigastric pain.    OB History    Gravida Para Term Preterm AB TAB SAB Ectopic Multiple Living   2 2 2  0 0 0 0 0 0 2      Past Medical History  Diagnosis Date  . Pinched nerve     in back; recently completed course of meds for sxs  . Strep sore throat   . Pneumonia     Past Surgical History  Procedure Laterality Date  . No past surgeries    . Wisdom tooth extraction      Family History  Problem Relation Age of Onset  . Hypertension Mother   . Hypertension Father   . Cancer Maternal Grandmother     breast  . Stroke Paternal Grandmother     Social History  Substance Use Topics  . Smoking status: Former Smoker -- 0.00 packs/day    Types: Cigarettes    Quit date: 11/24/2014  . Smokeless tobacco: Never Used  . Alcohol Use: No    Allergies: No Known Allergies  No prescriptions prior to admission    ROS Pertinent ROS in HPI.  All other systems are negative.   Physical Exam   Blood pressure 162/102, pulse 68, temperature 98.3 F (36.8 C), resp. rate 18, currently  breastfeeding.  Physical Exam  Constitutional: She is oriented to person, place, and time. She appears well-developed and well-nourished. No distress.  HENT:  Head: Normocephalic and atraumatic.  Eyes: EOM are normal.  Neck: Normal range of motion.  Cardiovascular: Normal rate.   Respiratory: No respiratory distress.  GI: Soft.  Fullness noted llq  Genitourinary:  Bimanual exam without CMT, adnexal mass or tenderness.   Visible external hemorrhoid  Musculoskeletal: Normal range of motion.  Neurological: She is alert and oriented to person, place, and time.  Skin: Skin is warm and dry.  Psychiatric: She has a normal mood and affect.   Results for orders placed or performed during the hospital encounter of 08/30/15 (from the past 24 hour(s))  Urinalysis, Routine w reflex microscopic (not at [facility])     Status: Abnormal   Collection Time: 08/30/15  5:20 PM  Result Value Ref Range   Color, Urine YELLOW YELLOW   APPearance CLEAR CLEAR   Specific Gravity, Urine 1.025 1.005 - 1.030   pH 6.0 5.0 - 8.0   Glucose, UA NEGATIVE NEGATIVE mg/dL   Hgb urine dipstick LARGE (A) NEGATIVE   Bilirubin Urine NEGATIVE NEGATIVE  Ketones, ur NEGATIVE NEGATIVE mg/dL   Protein, ur NEGATIVE NEGATIVE mg/dL   Urobilinogen, UA 0.2 0.0 - 1.0 mg/dL   Nitrite NEGATIVE NEGATIVE   Leukocytes, UA SMALL (A) NEGATIVE  Urine microscopic-add on     Status: None   Collection Time: 08/30/15  5:20 PM  Result Value Ref Range   Squamous Epithelial / LPF RARE RARE   WBC, UA 0-2 <3 WBC/hpf   RBC / HPF 0-2 <3 RBC/hpf   Bacteria, UA RARE RARE  Protein / creatinine ratio, urine     Status: None   Collection Time: 08/30/15  5:20 PM  Result Value Ref Range   Creatinine, Urine 175.00 mg/dL   Total Protein, Urine 20 mg/dL   Protein Creatinine Ratio 0.11 0.00 - 0.15 mg/mg[Cre]  CBC     Status: Abnormal   Collection Time: 08/30/15  6:34 PM  Result Value Ref Range   WBC 6.1 4.0 - 10.5 K/uL   RBC 4.39 3.87 - 5.11 MIL/uL    Hemoglobin 12.9 12.0 - 15.0 g/dL   HCT 39.4 36.0 - 46.0 %   MCV 89.7 78.0 - 100.0 fL   MCH 29.4 26.0 - 34.0 pg   MCHC 32.7 30.0 - 36.0 g/dL   RDW 15.9 (H) 11.5 - 15.5 %   Platelets 376 150 - 400 K/uL  Comprehensive metabolic panel     Status: Abnormal   Collection Time: 08/30/15  6:34 PM  Result Value Ref Range   Sodium 138 135 - 145 mmol/L   Potassium 4.0 3.5 - 5.1 mmol/L   Chloride 105 101 - 111 mmol/L   CO2 26 22 - 32 mmol/L   Glucose, Bld 81 65 - 99 mg/dL   BUN 13 6 - 20 mg/dL   Creatinine, Ser 0.87 0.44 - 1.00 mg/dL   Calcium 8.7 (L) 8.9 - 10.3 mg/dL   Total Protein 7.7 6.5 - 8.1 g/dL   Albumin 3.6 3.5 - 5.0 g/dL   AST 9 (L) 15 - 41 U/L   ALT 16 14 - 54 U/L   Alkaline Phosphatase 76 38 - 126 U/L   Total Bilirubin 0.5 0.3 - 1.2 mg/dL   GFR calc non Af Amer >60 >60 mL/min   GFR calc Af Amer >60 >60 mL/min   Anion gap 7 5 - 15  Lactate dehydrogenase     Status: Abnormal   Collection Time: 08/30/15  6:34 PM  Result Value Ref Range   LDH 211 (H) 98 - 192 U/L  Uric acid     Status: Abnormal   Collection Time: 08/30/15  6:34 PM  Result Value Ref Range   Uric Acid, Serum 7.4 (H) 2.3 - 6.6 mg/dL    MAU Course  Procedures  MDM PIH labs ordered for concern over blood pressure.  Proctofoam ordered for hemorrhoid.  Use and wait x 20 minutes and then order for Soap Suds Enema.   [facility] labs back and no concern identified.   Pt without bowel movement.   BP rechecked: 143/87 Dr. Ruthann Cancer consulted with all of the above info.  He advises for additional enema and discharge to home with hot soaks and symptomatic hemorrhoid cream.  Assessment and Plan  A:  1. Constipation, unspecified constipation type   2. External hemorrhoid   3. Transient hypertension of pregnancy, with delivery      P: Discharge to [place] rx Hot soaks  F/u in clinic for blood pressure check in 48 hours - 9/30.   Recheck  BP at home and report to Dr. Ruthann Cancer if elevated.    Paticia Stack 08/30/2015, 6:04 PM

## 2015-08-30 NOTE — MAU Note (Signed)
Pt presents to MAU with complaints of pain with urination and states that she thinks she has hemorrhoids from her delivery on September the 17th.

## 2015-09-11 ENCOUNTER — Encounter (HOSPITAL_COMMUNITY): Payer: Self-pay | Admitting: Emergency Medicine

## 2015-09-11 ENCOUNTER — Emergency Department (HOSPITAL_COMMUNITY)
Admission: EM | Admit: 2015-09-11 | Discharge: 2015-09-11 | Discharge status: Against Medical Advice | Payer: Medicaid Other | Attending: Emergency Medicine | Admitting: Emergency Medicine

## 2015-09-11 DIAGNOSIS — R110 Nausea: Secondary | ICD-10-CM | POA: Diagnosis not present

## 2015-09-11 DIAGNOSIS — R51 Headache: Secondary | ICD-10-CM | POA: Insufficient documentation

## 2015-09-11 NOTE — ED Notes (Signed)
Pt called from triage, no answer 

## 2015-09-11 NOTE — ED Notes (Signed)
Per pt, states she had baby 3 weeks ago, since then she has had a headache on and off-states some nausea

## 2015-09-11 NOTE — ED Notes (Signed)
Pt called again from triage, no answer 

## 2015-10-17 ENCOUNTER — Encounter (HOSPITAL_COMMUNITY): Payer: Self-pay | Admitting: *Deleted

## 2015-10-20 ENCOUNTER — Encounter: Payer: Medicaid Other | Admitting: Obstetrics & Gynecology

## 2015-10-20 ENCOUNTER — Ambulatory Visit (HOSPITAL_COMMUNITY): Payer: Medicaid Other

## 2015-11-03 ENCOUNTER — Encounter (HOSPITAL_COMMUNITY): Payer: Self-pay | Admitting: Emergency Medicine

## 2015-11-03 ENCOUNTER — Emergency Department (HOSPITAL_COMMUNITY)
Admission: EM | Admit: 2015-11-03 | Discharge: 2015-11-03 | Disposition: A | Discharge status: Home / Self Care | Payer: Medicaid Other | Attending: Emergency Medicine | Admitting: Emergency Medicine

## 2015-11-03 DIAGNOSIS — K0889 Other specified disorders of teeth and supporting structures: Secondary | ICD-10-CM | POA: Insufficient documentation

## 2015-11-03 DIAGNOSIS — Z79899 Other long term (current) drug therapy: Secondary | ICD-10-CM | POA: Insufficient documentation

## 2015-11-03 DIAGNOSIS — Z8701 Personal history of pneumonia (recurrent): Secondary | ICD-10-CM | POA: Insufficient documentation

## 2015-11-03 DIAGNOSIS — Z87891 Personal history of nicotine dependence: Secondary | ICD-10-CM | POA: Insufficient documentation

## 2015-11-03 MED ORDER — AMOXICILLIN 500 MG PO CAPS: 500.0000 mg | ORAL_CAPSULE | Freq: Three times a day (TID) | ORAL | Status: DC

## 2015-11-03 MED ORDER — IBUPROFEN 800 MG PO TABS: 800.0000 mg | ORAL_TABLET | Freq: Three times a day (TID) | ORAL | Status: DC

## 2015-11-03 NOTE — ED Notes (Signed)
Pt from home for eval of dental pain x3 days, airway intact. Denies any sob, n/v/d at this time. Broken tooth noted to bottom right mouth. nad noted.

## 2015-11-03 NOTE — ED Provider Notes (Signed)
CSN: [id number]     Arrival date & time 11/03/15  1739 History  By signing my name below, I, Randa Evens, attest that this documentation has been prepared under the direction and in the presence of Quincy Carnes, PA-C. Electronically Signed: Randa Evens, ED Scribe. 11/03/2015. 6:39 PM.    Chief Complaint  Patient presents with  . Dental Pain    Patient is a 34 y.o. female presenting with tooth pain. The history is provided by the patient. No language interpreter was used.  Dental Pain Associated symptoms: no drooling and no fever    HPI Comments: Victoria Harvey is a 34 y.o. female who presents to the Emergency Department complaining of worsening lower right sided dental pain onset 2 week prior. Pt states that the pain is radiating to her right ear. Pt states she has had associated right side gum swelling. Pt doesn't report any medications. Pt states that the pain is worse when chewing. Pt denies fever, nausea, vomiting, trouble swallowing or drooling. VSS.  Past Medical History  Diagnosis Date  . Pinched nerve     in back; recently completed course of meds for sxs  . Strep sore throat   . Pneumonia    Past Surgical History  Procedure Laterality Date  . No past surgeries    . Wisdom tooth extraction     Family History  Problem Relation Age of Onset  . Hypertension Mother   . Hypertension Father   . Cancer Maternal Grandmother     breast  . Stroke Paternal Grandmother    Social History  Substance Use Topics  . Smoking status: Former Smoker -- 0.00 packs/day    Types: Cigarettes    Quit date: 11/24/2014  . Smokeless tobacco: Never Used  . Alcohol Use: No   OB History    Gravida Para Term Preterm AB TAB SAB Ectopic Multiple Living   2 2 2  0 0 0 0 0 0 2     Review of Systems  Constitutional: Negative for fever.  HENT: Positive for dental problem. Negative for drooling and trouble swallowing.   Gastrointestinal: Negative for nausea and vomiting.  All other  systems reviewed and are negative.     Allergies  Review of patient's allergies indicates no known allergies.  Home Medications   Prior to Admission medications   Medication Sig Start Date End Date Taking? Authorizing Provider  hydrocortisone-pramoxine [facility]) rectal foam Place 1 applicator rectally 2 (two) times daily. 08/30/15   Paticia Stack, PA-C  Prenatal Vit-Fe Fumarate-FA (PRENATAL MULTIVITAMIN) TABS tablet Take 1 tablet by mouth daily at 12 noon.    Historical Provider, MD   BP 146/85 mmHg  Pulse 93  Temp(Src) 98.4 F (36.9 C) (Oral)  Resp 18  SpO2 99%   Physical Exam  Constitutional: She is oriented to person, place, and time. She appears well-developed and well-nourished.  HENT:  Head: Normocephalic and atraumatic.  Mouth/Throat: Oropharynx is clear and moist.  Teeth largely in fair dentition, right lower molar broken with cavity present, surrounding gingiva slightly swollen without definitive abscess, handling secretions appropriately, no trismus; no facial or neck swelling  Eyes: Conjunctivae and EOM are normal. Pupils are equal, round, and reactive to light.  Neck: Normal range of motion.  Cardiovascular: Normal rate, regular rhythm and normal heart sounds.   Pulmonary/Chest: Effort normal and breath sounds normal. No respiratory distress. She has no wheezes.  Abdominal: Soft. Bowel sounds are normal.  Musculoskeletal: Normal range of motion.  Neurological:  She is alert and oriented to person, place, and time.  Skin: Skin is warm and dry.  Psychiatric: She has a normal mood and affect.  Nursing note and vitals reviewed.   ED Course  Procedures (including critical care time) DIAGNOSTIC STUDIES: Oxygen Saturation is 99% on RA, normal by my interpretation.    COORDINATION OF CARE: 6:40 PM-Discussed treatment plan with pt at bedside and pt agreed to plan.     Labs Review Labs Reviewed - No data to display  Imaging Review No results  found.    EKG Interpretation None      MDM   Final diagnoses:  Pain, dental   34 year old female here with dental pain. No definitive abscess noted on exam, slight swelling of gingiva noted. No facial or neck swelling to suggest Ludwig angina. Patient will be started on antibiotics for possible developing dental infection. She was given follow-up with dentist on call.  Discussed plan with patient, he/she acknowledged understanding and agreed with plan of care.  Return precautions given for new or worsening symptoms.  I personally performed the services described in this documentation, which was scribed in my presence. The recorded information has been reviewed and is accurate.   Larene Pickett, PA-C 11/03/15 1937  Lacretia Leigh, MD 11/04/15 [phone]

## 2015-11-03 NOTE — Discharge Instructions (Signed)
Take the prescribed medication as directed. Follow-up with dentist-- call Monday to schedule appt. Return to the ED for new or worsening symptoms.

## 2015-11-13 ENCOUNTER — Encounter: Payer: Medicaid Other | Admitting: Obstetrics & Gynecology

## 2016-04-22 ENCOUNTER — Emergency Department (HOSPITAL_COMMUNITY): Payer: Medicaid Other

## 2016-04-22 ENCOUNTER — Emergency Department (HOSPITAL_COMMUNITY)
Admission: EM | Admit: 2016-04-22 | Discharge: 2016-04-22 | Disposition: A | Discharge status: Home / Self Care | Payer: Medicaid Other | Attending: Emergency Medicine | Admitting: Emergency Medicine

## 2016-04-22 ENCOUNTER — Encounter (HOSPITAL_COMMUNITY): Payer: Self-pay | Admitting: Emergency Medicine

## 2016-04-22 DIAGNOSIS — J189 Pneumonia, unspecified organism: Secondary | ICD-10-CM

## 2016-04-22 DIAGNOSIS — R059 Cough, unspecified: Secondary | ICD-10-CM

## 2016-04-22 DIAGNOSIS — R0602 Shortness of breath: Secondary | ICD-10-CM

## 2016-04-22 DIAGNOSIS — J181 Lobar pneumonia, unspecified organism: Secondary | ICD-10-CM | POA: Insufficient documentation

## 2016-04-22 DIAGNOSIS — Z87891 Personal history of nicotine dependence: Secondary | ICD-10-CM | POA: Insufficient documentation

## 2016-04-22 DIAGNOSIS — R05 Cough: Secondary | ICD-10-CM

## 2016-04-22 DIAGNOSIS — R0789 Other chest pain: Secondary | ICD-10-CM

## 2016-04-22 LAB — CBC
HCT: 40 % (ref 36.0–46.0)
Hemoglobin: 13.4 g/dL (ref 12.0–15.0)
MCH: 29.7 pg (ref 26.0–34.0)
MCHC: 33.5 g/dL (ref 30.0–36.0)
MCV: 88.7 fL (ref 78.0–100.0)
Platelets: 281 10*3/uL (ref 150–400)
RBC: 4.51 MIL/uL (ref 3.87–5.11)
RDW: 14.4 % (ref 11.5–15.5)
WBC: 4.7 10*3/uL (ref 4.0–10.5)

## 2016-04-22 LAB — BASIC METABOLIC PANEL
Anion gap: 9 (ref 5–15)
BUN: 11 mg/dL (ref 6–20)
CO2: 26 mmol/L (ref 22–32)
Calcium: 8.8 mg/dL — ABNORMAL LOW (ref 8.9–10.3)
Chloride: 103 mmol/L (ref 101–111)
Creatinine, Ser: 0.77 mg/dL (ref 0.44–1.00)
GFR calc Af Amer: >60 mL/min (ref >60)
GFR calc non Af Amer: >60 mL/min (ref >60)
Glucose, Bld: 85 mg/dL (ref 65–99)
Potassium: 3.9 mmol/L (ref 3.5–5.1)
Sodium: 138 mmol/L (ref 135–145)

## 2016-04-22 LAB — I-STAT TROPONIN, ED: Troponin i, poc: 0 ng/mL (ref 0.00–0.08)

## 2016-04-22 MED ORDER — LEVOFLOXACIN 750 MG PO TABS: 750.0000 mg | ORAL_TABLET | Freq: Every day | ORAL | Status: DC

## 2016-04-22 MED ORDER — ALBUTEROL SULFATE HFA 108 (90 BASE) MCG/ACT IN AERS
2.0000 | INHALATION_SPRAY | Freq: Once | RESPIRATORY_TRACT | Status: AC
  Administered 2016-04-22: 2 via RESPIRATORY_TRACT
  Filled 2016-04-22: qty 6.7

## 2016-04-22 MED ORDER — ALBUTEROL SULFATE HFA 108 (90 BASE) MCG/ACT IN AERS: 2.0000 | INHALATION_SPRAY | RESPIRATORY_TRACT | Status: DC | PRN

## 2016-04-22 MED ORDER — ALBUTEROL SULFATE (2.5 MG/3ML) 0.083% IN NEBU
5.0000 mg | INHALATION_SOLUTION | Freq: Once | RESPIRATORY_TRACT | Status: AC
  Administered 2016-04-22: 5 mg via RESPIRATORY_TRACT
  Filled 2016-04-22: qty 6

## 2016-04-22 MED ORDER — KETOROLAC TROMETHAMINE 30 MG/ML IJ SOLN
30.0000 mg | Freq: Once | INTRAMUSCULAR | Status: AC
  Administered 2016-04-22: 30 mg via INTRAMUSCULAR
  Filled 2016-04-22: qty 1

## 2016-04-22 MED ORDER — IPRATROPIUM BROMIDE 0.02 % IN SOLN
0.5000 mg | Freq: Once | RESPIRATORY_TRACT | Status: AC
  Administered 2016-04-22: 0.5 mg via RESPIRATORY_TRACT
  Filled 2016-04-22: qty 2.5

## 2016-04-22 MED ORDER — LEVOFLOXACIN 750 MG PO TABS
750.0000 mg | ORAL_TABLET | Freq: Once | ORAL | Status: AC
  Administered 2016-04-22: 750 mg via ORAL
  Filled 2016-04-22: qty 1

## 2016-04-22 NOTE — ED Provider Notes (Signed)
CSN: [id number]     Arrival date & time 04/22/16  1334 History   First MD Initiated Contact with Patient 04/22/16 1617     Chief Complaint  Patient presents with  . Chest Pain  . Shortness of Breath  . Cough     (Consider location/radiation/quality/duration/timing/severity/associated sxs/prior Treatment) HPI Comments: Victoria Harvey is a 35 y.o. female with a PMHx of back pain/pinched nerve, and prior PNA, who presents to the ED with complaints of gradual onset central chest pain 3 days. She describes the pain is 8/10 constant sharp nonradiating pain in the center chest, worse with coughing, and unrelieved with Motrin and Tylenol. Associated symptoms include fever with Tmax 101.0 two days ago which has since resolved, shortness of breath, and dry cough. Positive sick contacts at home, daughter had pneumonia and was treated with amoxicillin recently. Patient states she gets recurrent pneumonia every year around this time of year. Denies possibility of pregnancy, no longer breast-feeding.  She denies any chills, hemoptysis, wheezing, leg swelling, recent travel/surgery/immobilization, OCPs, personal or family history of DVT/PE, lightheadedness, diaphoresis, abdominal pain, nausea, vomiting, diarrhea, constipation, dysuria, hematuria, numbness, tingling, weakness, rhinorrhea, sore throat, ear pain or drainage, or any other symptoms. She is a nonsmoker with no family history of cardiac disease.  Patient is a 35 y.o. female presenting with chest pain, shortness of breath, and cough. The history is provided by the patient. No language interpreter was used.  Chest Pain Pain location:  Substernal area Pain quality: sharp   Pain radiates to:  Does not radiate Pain radiates to the back: no   Pain severity:  Moderate Onset quality:  Gradual Duration:  3 days Timing:  Constant Progression:  Unchanged Chronicity:  New Context: at rest   Context comment:  URI, coughing Relieved by:   Nothing Worsened by:  Coughing Ineffective treatments: tylenol and motrin. Associated symptoms: cough, fever (Tmax 101.0 two days ago, resolved now) and shortness of breath   Associated symptoms: no abdominal pain, no diaphoresis, no lower extremity edema, no nausea, no numbness, not vomiting and no weakness   Risk factors: no birth control, no coronary artery disease, no diabetes mellitus, no high cholesterol, no hypertension, no immobilization, not pregnant, no prior DVT/PE, no smoking and no surgery   Shortness of Breath Associated symptoms: chest pain, cough and fever (Tmax 101.0 two days ago, resolved now)   Associated symptoms: no abdominal pain, no diaphoresis, no ear pain, no sore throat, no vomiting and no wheezing   Cough Associated symptoms: chest pain, fever (Tmax 101.0 two days ago, resolved now) and shortness of breath   Associated symptoms: no chills, no diaphoresis, no ear pain, no myalgias, no rhinorrhea, no sore throat and no wheezing     Past Medical History  Diagnosis Date  . Pinched nerve     in back; recently completed course of meds for sxs  . Strep sore throat   . Pneumonia    Past Surgical History  Procedure Laterality Date  . No past surgeries    . Wisdom tooth extraction     Family History  Problem Relation Age of Onset  . Hypertension Mother   . Hypertension Father   . Cancer Maternal Grandmother     breast  . Stroke Paternal Grandmother    Social History  Substance Use Topics  . Smoking status: Former Smoker -- 0.00 packs/day    Types: Cigarettes    Quit date: 11/24/2014  . Smokeless tobacco: Never Used  . Alcohol  Use: No   OB History    Gravida Para Term Preterm AB TAB SAB Ectopic Multiple Living   2 2 2  0 0 0 0 0 0 2     Review of Systems  Constitutional: Positive for fever (Tmax 101.0 two days ago, resolved now). Negative for chills and diaphoresis.  HENT: Negative for ear discharge, ear pain, rhinorrhea and sore throat.   Respiratory:  Positive for cough and shortness of breath. Negative for wheezing.   Cardiovascular: Positive for chest pain. Negative for leg swelling.  Gastrointestinal: Negative for nausea, vomiting, abdominal pain, diarrhea and constipation.  Genitourinary: Negative for dysuria and hematuria.  Musculoskeletal: Negative for myalgias and arthralgias.  Skin: Negative for color change.  Allergic/Immunologic: Negative for immunocompromised state.  Neurological: Negative for weakness, light-headedness and numbness.  Psychiatric/Behavioral: Negative for confusion.   10 Systems reviewed and are negative for acute change except as noted in the HPI.    Allergies  Review of patient's allergies indicates no known allergies.  Home Medications   Prior to Admission medications   Medication Sig Start Date End Date Taking? Authorizing Provider  ibuprofen (ADVIL,MOTRIN) 200 MG tablet Take 200-400 mg by mouth every 6 (six) hours as needed for headache or moderate pain.   Yes Historical Provider, MD  hydrocortisone-pramoxine [facility]) rectal foam Place 1 applicator rectally 2 (two) times daily. Patient not taking: Reported on 04/22/2016 08/30/15   Paticia Stack, PA-C  ibuprofen (ADVIL,MOTRIN) 800 MG tablet Take 1 tablet (800 mg total) by mouth 3 (three) times daily. Patient not taking: Reported on 04/22/2016 11/03/15   Larene Pickett, PA-C   BP 152/100 mmHg  Pulse 90  Temp(Src) 99.1 F (37.3 C) (Oral)  Resp 17  Ht 5\' 7"  (1.702 m)  SpO2 98% Physical Exam  Constitutional: She is oriented to person, place, and time. Vital signs are normal. She appears well-developed and well-nourished.  Non-toxic appearance. No distress.  Afebrile, nontoxic, NAD  HENT:  Head: Normocephalic and atraumatic.  Mouth/Throat: Oropharynx is clear and moist and mucous membranes are normal.  Eyes: Conjunctivae and EOM are normal. Right eye exhibits no discharge. Left eye exhibits no discharge.  Neck: Normal range of motion.  Neck supple.  Cardiovascular: Normal rate, regular rhythm, normal heart sounds and intact distal pulses.  Exam reveals no gallop and no friction rub.   No murmur heard. RRR, nl s1/s2, no m/r/g, distal pulses intact, no pedal edema   Pulmonary/Chest: Effort normal. No respiratory distress. She has decreased breath sounds in the right lower field. She has no wheezes. She has rhonchi in the right lower field. She has no rales. She exhibits no tenderness, no crepitus, no deformity and no retraction.  Diminished sounds and slight rhonchi in the RLF, all other lung fields without w/r/r, no hypoxia or increased WOB, speaking in full sentences, SpO2 98% on RA  Chest wall nonTTP without crepitus, deformities, or retractions   Abdominal: Soft. Normal appearance and bowel sounds are normal. She exhibits no distension. There is no tenderness. There is no rigidity, no rebound, no guarding, no CVA tenderness, no tenderness at McBurney's point and negative Murphy's sign.  Musculoskeletal: Normal range of motion.  MAE x4 Strength and sensation grossly intact Distal pulses intact Gait steady No pedal edema, neg homan's bilaterally   Neurological: She is alert and oriented to person, place, and time. She has normal strength. No sensory deficit.  Skin: Skin is warm, dry and intact. No rash noted.  Psychiatric: She has a  normal mood and affect.  Nursing note and vitals reviewed.   ED Course  Procedures (including critical care time) Labs Review Labs Reviewed  BASIC METABOLIC PANEL - Abnormal; Notable for the following:    Calcium 8.8 (*)    All other components within normal limits  CBC  I-STAT TROPOININ, ED    Imaging Review Dg Chest 2 View  04/22/2016  CLINICAL DATA:  Central chest pain, worse with coughing for 4 days, shortness of breath. EXAM: CHEST  2 VIEW COMPARISON:  Chest x-ray dated 04/28/2015. FINDINGS: New airspace opacity at the right lung base posteriorly, compatible with pneumonia. Lungs  otherwise clear. Heart size is normal. There is stable mild elevation of the right hemidiaphragm. Osseous and soft tissue structures about the chest are otherwise unremarkable. IMPRESSION: Right lower lobe pneumonia. Electronically Signed   By: Franki Cabot M.D.   On: 04/22/2016 14:02   I have personally reviewed and evaluated these images and lab results as part of my medical decision-making.   EKG Interpretation   Date/Time:  Monday Apr 22 2016 13:41:26 EDT Ventricular Rate:  101 PR Interval:  146 QRS Duration: 87 QT Interval:  362 QTC Calculation: 469 R Axis:   85 Text Interpretation:  Sinus tachycardia Since last tracing rate faster  Confirmed by KNAPP  MD-J, JON ([id number]) on 04/22/2016 1:46:31 PM      MDM   Final diagnoses:  Right lower lobe pneumonia  CAP (community acquired pneumonia)  Cough  SOB (shortness of breath)  Chest wall pain    35 y.o. female here with cough, CP, SOB, and fever x3 days, +Sick contacts at home. On exam, R lower lung fields diminished and rhonchorous, no wheezing or rales, other lung fields clear. No tachycardia or hypoxia, PERC neg, doubt PE. Trop neg, EKG unremarkable, BMP/CBC WNL, and CXR reveals RLL PNA. Will give neb tx to see if this helps with her SOB, and give first dose of levaquin here. Will give toradol for pain. Will reassess shortly.   6:43 PM Pain improved, SOB improved. Lung sounds somewhat improved after neb tx. Will d/c home with inhaler before she leaves since she has no insurance, and rx for another inhaler as well as levaquin x4 more days starting tomorrow. F/up with [place] in 1wk to establish care and recheck symptoms. OTC meds for symptom control discussed. I explained the diagnosis and have given explicit precautions to return to the ER including for any other new or worsening symptoms. The patient understands and accepts the medical plan as it's been dictated and I have answered their questions. Discharge instructions concerning home  care and prescriptions have been given. The patient is STABLE and is discharged to home in good condition.  BP 127/66 mmHg  Pulse 110  Temp(Src) 99.1 F (37.3 C) (Oral)  Resp 18  Ht 5\' 7"  (1.702 m)  SpO2 100%  Meds ordered this encounter  Medications  . albuterol (PROVENTIL) (2.5 MG/3ML) 0.083% nebulizer solution 5 mg    Sig:   . ipratropium (ATROVENT) nebulizer solution 0.5 mg    Sig:   . levofloxacin (LEVAQUIN) tablet 750 mg    Sig:   . ketorolac (TORADOL) 30 MG/ML injection 30 mg    Sig:   . albuterol (PROVENTIL HFA;VENTOLIN HFA) 108 (90 Base) MCG/ACT inhaler 2 puff    Sig:   . levofloxacin (LEVAQUIN) 750 MG tablet    Sig: Take 1 tablet (750 mg total) by mouth daily. X 4 days starting 04/23/16  Dispense:  4 tablet    Refill:  0    Order Specific Question:  Supervising Provider    Answer:  MILLER, BRIAN [[id number]]  . albuterol (PROVENTIL HFA;VENTOLIN HFA) 108 (90 Base) MCG/ACT inhaler    Sig: Inhale 2 puffs into the lungs every 4 (four) hours as needed for wheezing or shortness of breath (cough).    Dispense:  1 Inhaler    Refill:  0    Order Specific Question:  Supervising Provider    Answer:  Noemi Chapel [[id number]]       Hardy Harcum Camprubi-Soms, PA-C 04/22/16 1844  Gareth Morgan, MD 04/23/16 1319

## 2016-04-22 NOTE — ED Notes (Signed)
Patient c/o central chest pain that is worse with coughing x 4 days and SOB.  Patient has non productive cough and states that her daughter was diagnosed with PNA about 4 days ago.

## 2016-04-22 NOTE — Discharge Instructions (Signed)
Continue to stay well-hydrated. Continue to alternate between Tylenol and Ibuprofen for pain or fever. Use Mucinex for cough suppression/expectoration of mucus. May consider over-the-counter Benadryl or other antihistamine to decrease secretions and for watery itchy eyes. Use inhaler as directed, as needed for cough/chest congestion/wheezing. Take antibiotic as directed and until completed, starting on 04/23/16 since you got your first dose here today. Followup with [facility] in 5-7 days for recheck of ongoing symptoms and to establish medical care. Return to emergency department for emergent changing or worsening of symptoms.   Community-Acquired Pneumonia, Adult Pneumonia is an infection of the lungs. One type of pneumonia can happen while a person is in a hospital. A different type can happen when a person is not in a hospital (community-acquired pneumonia). It is easy for this kind to spread from person to person. It can spread to you if you breathe near an infected person who coughs or sneezes. Some symptoms include:  A dry cough.  A wet (productive) cough.  Fever.  Sweating.  Chest pain. HOME CARE  Take over-the-counter and prescription medicines only as told by your doctor.  Only take cough medicine if you are losing sleep.  If you were prescribed an antibiotic medicine, take it as told by your doctor. Do not stop taking the antibiotic even if you start to feel better.  Sleep with your head and neck raised (elevated). You can do this by putting a few pillows under your head, or you can sleep in a recliner.  Do not use tobacco products. These include cigarettes, chewing tobacco, and e-cigarettes. If you need help quitting, ask your doctor.  Drink enough water to keep your pee (urine) clear or pale yellow. A shot (vaccine) can help prevent pneumonia. Shots are often suggested for:  People older than 34 years of age.  People older than 34 years of age:  Who are  having cancer treatment.  Who have long-term (chronic) lung disease.  Who have problems with their body's defense system (immune system). You may also prevent pneumonia if you take these actions:  Get the flu (influenza) shot every year.  Go to the dentist as often as told.  Wash your hands often. If soap and water are not available, use hand sanitizer. GET HELP IF:  You have a fever.  You lose sleep because your cough medicine does not help. GET HELP RIGHT AWAY IF:  You are short of breath and it gets worse.  You have more chest pain.  Your sickness gets worse. This is very serious if:  You are an older adult.  Your body's defense system is weak.  You cough up blood.   This information is not intended to replace advice given to you by your health care provider. Make sure you discuss any questions you have with your health care provider.   Document Released: 05/06/2008 Document Revised: 08/09/2015 Document Reviewed: 03/15/2015 Elsevier Interactive Patient Education 2016 Elsevier Inc.  Cough, Adult Coughing is a reflex that clears your throat and your airways. Coughing helps to heal and protect your lungs. It is normal to cough occasionally, but a cough that happens with other symptoms or lasts a long time may be a sign of a condition that needs treatment. A cough may last only 2-3 weeks (acute), or it may last longer than 8 weeks (chronic). CAUSES Coughing is commonly caused by:  Breathing in substances that irritate your lungs.  A viral or bacterial respiratory infection.  Allergies.  Asthma.  Postnasal drip.  Smoking.  Acid backing up from the stomach into the esophagus (gastroesophageal reflux).  Certain medicines.  Chronic lung problems, including COPD (or rarely, lung cancer).  Other medical conditions such as heart failure. HOME CARE INSTRUCTIONS  Pay attention to any changes in your symptoms. Take these actions to help with your discomfort:  Take  medicines only as told by your health care provider.  If you were prescribed an antibiotic medicine, take it as told by your health care provider. Do not stop taking the antibiotic even if you start to feel better.  Talk with your health care provider before you take a cough suppressant medicine.  Drink enough fluid to keep your urine clear or pale yellow.  If the air is dry, use a cold steam vaporizer or humidifier in your bedroom or your home to help loosen secretions.  Avoid anything that causes you to cough at work or at home.  If your cough is worse at night, try sleeping in a semi-upright position.  Avoid cigarette smoke. If you smoke, quit smoking. If you need help quitting, ask your health care provider.  Avoid caffeine.  Avoid alcohol.  Rest as needed. SEEK MEDICAL CARE IF:   You have new symptoms.  You cough up pus.  Your cough does not get better after 2-3 weeks, or your cough gets worse.  You cannot control your cough with suppressant medicines and you are losing sleep.  You develop pain that is getting worse or pain that is not controlled with pain medicines.  You have a fever.  You have unexplained weight loss.  You have night sweats. SEEK IMMEDIATE MEDICAL CARE IF:  You cough up blood.  You have difficulty breathing.  Your heartbeat is very fast.   This information is not intended to replace advice given to you by your health care provider. Make sure you discuss any questions you have with your health care provider.   Document Released: 05/17/2011 Document Revised: 08/09/2015 Document Reviewed: 01/25/2015 Elsevier Interactive Patient Education 2016 Elsevier Inc.  Chest Wall Pain Chest wall pain is pain in or around the bones and muscles of your chest. Sometimes, an injury causes this pain. Sometimes, the cause may not be known. This pain may take several weeks or longer to get better. HOME CARE Pay attention to any changes in your symptoms. Take  these actions to help with your pain:  Rest as told by your doctor.  Avoid activities that cause pain. Try not to use your chest, belly (abdominal), or side muscles to lift heavy things.  If directed, apply ice to the painful area:  Put ice in a plastic bag.  Place a towel between your skin and the bag.  Leave the ice on for 20 minutes, 2-3 times per day.  Take over-the-counter and prescription medicines only as told by your doctor.  Do not use tobacco products, including cigarettes, chewing tobacco, and e-cigarettes. If you need help quitting, ask your doctor.  Keep all follow-up visits as told by your doctor. This is important. GET HELP IF:  You have a fever.  Your chest pain gets worse.  You have new symptoms. GET HELP RIGHT AWAY IF:  You feel sick to your stomach (nauseous) or you throw up (vomit).  You feel sweaty or light-headed.  You have a cough with phlegm (sputum) or you cough up blood.  You are short of breath.   This information is not intended to replace advice given to you by  your health care provider. Make sure you discuss any questions you have with your health care provider.   Document Released: 05/06/2008 Document Revised: 08/09/2015 Document Reviewed: 02/13/2015 Elsevier Interactive Patient Education 2016 [place] of Breath Shortness of breath means you have trouble breathing. Shortness of breath needs medical care right away. HOME CARE   Do not smoke.  Avoid being around chemicals or things (paint fumes, dust) that may bother your breathing.  Rest as needed. Slowly begin your normal activities.  Only take medicines as told by your doctor.  Keep all doctor visits as told. GET HELP RIGHT AWAY IF:   Your shortness of breath gets worse.  You feel lightheaded, pass out (faint), or have a cough that is not helped by medicine.  You cough up blood.  You have pain with breathing.  You have pain in your chest, arms, shoulders,  or belly (abdomen).  You have a fever.  You cannot walk up stairs or exercise the way you normally do.  You do not get better in the time expected.  You have a hard time doing normal activities even with rest.  You have problems with your medicines.  You have any new symptoms. MAKE SURE YOU:  Understand these instructions.  Will watch your condition.  Will get help right away if you are not doing well or get worse.   This information is not intended to replace advice given to you by your health care provider. Make sure you discuss any questions you have with your health care provider.   Document Released: 05/06/2008 Document Revised: 11/23/2013 Document Reviewed: 02/03/2012 Elsevier Interactive Patient Education [employer].

## 2016-04-23 ENCOUNTER — Telehealth: Payer: Self-pay | Admitting: *Deleted

## 2016-04-23 NOTE — Telephone Encounter (Signed)
Pt calling stating Rx prescribed is too expensive and she can not afford them.  NCM searched GoodRx.com for an affordable coupon.  NCM text coupon to pt phone and stayed online until it was received.  Pt very appreciative.

## 2016-04-26 ENCOUNTER — Ambulatory Visit: Payer: Self-pay | Attending: Physician Assistant | Admitting: Physician Assistant

## 2016-04-26 VITALS — BP 108/68 | HR 79 | Temp 98.3°F | Ht 67.0 in | Wt 161.8 lb

## 2016-04-26 DIAGNOSIS — Z79899 Other long term (current) drug therapy: Secondary | ICD-10-CM | POA: Insufficient documentation

## 2016-04-26 DIAGNOSIS — J9801 Acute bronchospasm: Secondary | ICD-10-CM | POA: Insufficient documentation

## 2016-04-26 DIAGNOSIS — J189 Pneumonia, unspecified organism: Secondary | ICD-10-CM | POA: Insufficient documentation

## 2016-04-26 MED ORDER — AZITHROMYCIN 250 MG PO TABS: ORAL_TABLET | ORAL | Status: DC

## 2016-04-26 MED ORDER — METHYLPREDNISOLONE SODIUM SUCC 125 MG IJ SOLR
125.0000 mg | Freq: Once | INTRAMUSCULAR | Status: AC
  Administered 2016-04-26: 125 mg via INTRAMUSCULAR

## 2016-04-26 MED FILL — AZITHROMYCIN 250 MG TABLET: 250 | 5 days supply | Qty: 6 | Fill #0

## 2016-04-26 NOTE — Progress Notes (Signed)
Patient ID: Victoria Harvey, female   DOB: [birth year], 35 y.o.   MRN: [id number]   Victoria Harvey, is a 35 y.o. female  [id number]  [id number]  DOB - 12-May-1981  No chief complaint on file.       Subjective:  Chief Complaint and HPI: Victoria Harvey is a 35 y.o. female here today to establish care and for a follow up visit after being diagnosed with RLL pneumonia in the ED on 04/22/2016.  She continues to have a cough and wheezing and doesn't feel well.  She only took 2 doses of Levaquin because it made her vomit and made her dizzy.  She has not had further fever.  She is using the albuterol inhaler a couple of times each day.  Some greenish yellow mucus.  PMH unrmearkable   ED/Hospital notes reviewed.  CXR-RLL infiltrate   ROS:   Constitutional:  No subsequent fever, +chills, No night sweats, No unexplained weight loss. EENT:  No vision changes, No blurry vision, No hearing changes. No mouth, throat, or ear problems.  Respiratory: +cough and wheezing Cardiac: No CP, no palpitations GI:  No abd pain, No N/V/D. GU: No Urinary s/sx Musculoskeletal: No joint pain Neuro: No headache, no dizziness, no motor weakness.  Skin: No rash Endocrine:  No polydipsia. No polyuria.  Psych: Denies SI/HI  No problems updated.  ALLERGIES: Allergies  Allergen Reactions  . Levofloxacin Nausea And Vomiting    PAST MEDICAL HISTORY: Past Medical History  Diagnosis Date  . Pinched nerve     in back; recently completed course of meds for sxs  . Strep sore throat   . Pneumonia     MEDICATIONS AT HOME: Prior to Admission medications   Medication Sig Start Date End Date Taking? Authorizing Provider  albuterol (PROVENTIL HFA;VENTOLIN HFA) 108 (90 Base) MCG/ACT inhaler Inhale 2 puffs into the lungs every 4 (four) hours as needed for wheezing or shortness of breath (cough). 04/22/16  Yes Mercedes Camprubi-Soms, PA-C  ibuprofen (ADVIL,MOTRIN) 200 MG tablet Take 200-400 mg by mouth every 6 (six)  hours as needed for headache or moderate pain.   Yes Historical Provider, MD  ibuprofen (ADVIL,MOTRIN) 800 MG tablet Take 1 tablet (800 mg total) by mouth 3 (three) times daily. 11/03/15  Yes Larene Pickett, PA-C  azithromycin [facility]) 250 MG tablet Take 2 today then 1 days 2-5 04/26/16   Argentina Donovan, PA-C  hydrocortisone-pramoxine [facility]) rectal foam Place 1 applicator rectally 2 (two) times daily. Patient not taking: Reported on 04/22/2016 08/30/15   Paticia Stack, PA-C     Objective:  EXAM:   Filed Vitals:   04/26/16 1412 04/26/16 1414  BP:  108/68  Pulse:  79  Temp:  98.3 F (36.8 C)  TempSrc:  Oral  Height: 5\' 7"  (1.702 m)   Weight: 161 lb 12.8 oz (73.392 kg)   SpO2:  98%    General appearance : A&OX3. NAD. Non-toxic-appearing HEENT: Atraumatic and Normocephalic.  PERRLA. EOM intact.  TM clear B. Mouth-MMM, post pharynx WNL w/o erythema, No PND. Neck: supple, no JVD. No cervical lymphadenopathy. No thyromegaly Chest/Lungs: +mild wheezing throughout with rales at R base.  Good air movement overall.   CVS: RRR w/o m/g/r Extremities: Bilateral Lower Ext shows no edema, both legs are warm to touch with = pulse throughout Neurology:  CN II-XII grossly intact, Non focal.   Psych:  TP linear. J/I WNL. Normal speech. Appropriate eye contact and affect.  Skin:  No Rash  Data Review No results found for: HGBA1C   Assessment & Plan   1. CAP (community acquired pneumonia) Incomplete treatment. Zpack as directed, fluids, rest, continue albuterol as directed prn  2. Bronchospasm Normal blood sugar in ED - methylPREDNISolone sodium succinate (SOLU-MEDROL) 125 mg/2 mL injection 125 mg; Inject 2 mLs (125 mg total) into the muscle once.  Patient have been counseled extensively about nutrition and exercise  Return in about 3 months (around 07/27/2016) for cpe.  The patient was given clear instructions to go to ER or return to medical center if symptoms don't  improve, worsen or new problems develop. The patient verbalized understanding. The patient was told to call to get lab results if they haven't heard anything in the next week.     Freeman Caldron, PA-C [facility] [place], [place]   04/26/2016, 2:52 PM

## 2016-04-26 NOTE — Progress Notes (Signed)
Pain in chest from coughing.  Coughing x1 week, Dry cough.  Being seen for follow up today

## 2016-04-26 NOTE — Patient Instructions (Signed)

## 2016-05-20 ENCOUNTER — Encounter (HOSPITAL_COMMUNITY): Payer: Self-pay

## 2016-05-20 ENCOUNTER — Emergency Department (HOSPITAL_COMMUNITY)
Admission: EM | Admit: 2016-05-20 | Discharge: 2016-05-20 | Disposition: A | Discharge status: Home / Self Care | Payer: Medicaid Other | Attending: Emergency Medicine | Admitting: Emergency Medicine

## 2016-05-20 DIAGNOSIS — Z87891 Personal history of nicotine dependence: Secondary | ICD-10-CM | POA: Insufficient documentation

## 2016-05-20 DIAGNOSIS — R21 Rash and other nonspecific skin eruption: Secondary | ICD-10-CM | POA: Insufficient documentation

## 2016-05-20 MED ORDER — TRIAMCINOLONE ACETONIDE 0.1 % EX CREA: TOPICAL_CREAM | CUTANEOUS | Status: DC

## 2016-05-20 MED ORDER — PREDNISONE 20 MG PO TABS: 20.0000 mg | ORAL_TABLET | Freq: Every day | ORAL | Status: DC

## 2016-05-20 NOTE — ED Provider Notes (Signed)
CSN: [id number]     Arrival date & time 05/20/16  1434 History   By signing my name below, I, Jasmyn B. Alexander, attest that this documentation has been prepared under the direction and in the presence of Gloriann Loan, PA-C.  Electronically Signed: Tedra Coupe. Sheppard Coil, ED Scribe. 05/20/2016. 5:21 PM.    Chief Complaint  Patient presents with  . Rash    The history is provided by the patient. No language interpreter was used.   HPI Comments: Victoria Harvey is a 35 y.o. female who presents to the Emergency Department complaining of gradual onset, constant, worsening, pruritic, non-erythematous rash x 1 week. Pt reports rash is located on bilateral legs and arms, specifically flexor surfaces of her b/l arms and legs, and it recently has spread to her face 3 days PTA. Denies any recent use of new detergents, soaps, or lotions. She has applied Cortisone cream, Benadryl cream, and taken Benadryl with mild relief of itcing. She has no pertinent skin hx. She denies any contact with anyone of similar symptoms. Denies any fever, throat swelling, or tongue swelling, purulent drainage, warmth, or erythema.   Past Medical History  Diagnosis Date  . Pinched nerve     in back; recently completed course of meds for sxs  . Strep sore throat   . Pneumonia    Past Surgical History  Procedure Laterality Date  . No past surgeries    . Wisdom tooth extraction     Family History  Problem Relation Age of Onset  . Hypertension Mother   . Hypertension Father   . Cancer Maternal Grandmother     breast  . Stroke Paternal Grandmother    Social History  Substance Use Topics  . Smoking status: Former Smoker -- 0.00 packs/day    Types: Cigarettes    Quit date: 11/24/2014  . Smokeless tobacco: Never Used  . Alcohol Use: No   OB History    Gravida Para Term Preterm AB TAB SAB Ectopic Multiple Living   2 2 2  0 0 0 0 0 0 2     Review of Systems  Constitutional: Negative for fever.  Skin: Positive for  rash.  All other systems reviewed and are negative.  Allergies  Levofloxacin  Home Medications   Prior to Admission medications   Medication Sig Start Date End Date Taking? Authorizing Provider  albuterol (PROVENTIL HFA;VENTOLIN HFA) 108 (90 Base) MCG/ACT inhaler Inhale 2 puffs into the lungs every 4 (four) hours as needed for wheezing or shortness of breath (cough). 04/22/16   Mercedes Camprubi-Soms, PA-C  azithromycin (ZITHROMAX) 250 MG tablet Take 2 today then 1 days 2-5 04/26/16   Argentina Donovan, PA-C  hydrocortisone-pramoxine [facility]) rectal foam Place 1 applicator rectally 2 (two) times daily. Patient not taking: Reported on 04/22/2016 08/30/15   Paticia Stack, PA-C  ibuprofen (ADVIL,MOTRIN) 200 MG tablet Take 200-400 mg by mouth every 6 (six) hours as needed for headache or moderate pain.    Historical Provider, MD  ibuprofen (ADVIL,MOTRIN) 800 MG tablet Take 1 tablet (800 mg total) by mouth 3 (three) times daily. 11/03/15   Larene Pickett, PA-C  predniSONE (DELTASONE) 20 MG tablet Take 1 tablet (20 mg total) by mouth daily. 05/20/16   Gloriann Loan, PA-C  triamcinolone cream (KENALOG) 0.1 % Apply two times daily to affected areas on your arms and legs.  Do not apply to face. 05/20/16   Urijah Arko, PA-C   BP 143/81 mmHg  Pulse 63  Temp(Src) 97.9 F (36.6 C) (Oral)  Resp 16  SpO2 100% Physical Exam  Constitutional: She is oriented to person, place, and time. She appears well-developed and well-nourished.  Non-toxic appearance. She does not have a sickly appearance. She does not appear ill.  HENT:  Head: Normocephalic and atraumatic.  Mouth/Throat: Oropharynx is clear and moist.  No facial swelling or tongue swelling. Airway patent. Patient will speak in clear full sentences without difficulty.  Eyes: Conjunctivae are normal. Pupils are equal, round, and reactive to light.  Neck: Normal range of motion. Neck supple.  Cardiovascular: Normal rate and regular rhythm.    Pulmonary/Chest: Effort normal and breath sounds normal. No accessory muscle usage or stridor. No respiratory distress. She has no wheezes. She has no rhonchi. She has no rales.  Abdominal: Soft. Bowel sounds are normal. She exhibits no distension. There is no tenderness.  Musculoskeletal: Normal range of motion.  Lymphadenopathy:    She has no cervical adenopathy.  Neurological: She is alert and oriented to person, place, and time.  Speech clear without dysarthria.  Skin: Skin is warm and dry.  Non-tender, non-erythematous, papular rash scattered of left face, b/l arms and legs.  Plaques to flexor surfaces of b/l arms and legs.  No drainage.  No warmth, erythema, or signs of infection.   Psychiatric: She has a normal mood and affect. Her behavior is normal.    ED Course  Procedures (including critical care time) DIAGNOSTIC STUDIES: Oxygen Saturation is 100% on RA, normal by my interpretation.    COORDINATION OF CARE: 5:15 PM-Discussed treatment plan which includes order of Prednisone and Kenalog with pt at bedside and pt agreed to plan.   MDM   Final diagnoses:  Rash   Findings consistent with likely eczema. No signs of infection. No signs of anaphylaxis. Discharge with topical triamcinolone cream and short burst of prednisone. Recommend Zyrtec for pruritus. Follow up with dermatology. Discussed return precautions. Patient agrees and acknowledges the above plan for discharge.  I personally performed the services described in this documentation, which was scribed in my presence. The recorded information has been reviewed and is accurate.    Gloriann Loan, PA-C 05/21/16 0115  Lajean Saver, MD 05/21/16 2252

## 2016-05-20 NOTE — ED Notes (Signed)
Patient here with itchy rash to left side of face and arms and legs for the past few days. No known exposure

## 2016-05-20 NOTE — ED Notes (Signed)
Declined W/C at D/C and was escorted to lobby by RN. 

## 2016-05-20 NOTE — Discharge Instructions (Signed)
Eczema Eczema, also called atopic dermatitis, is a skin disorder that causes inflammation of the skin. It causes a red rash and dry, scaly skin. The skin becomes very itchy. Eczema is generally worse during the cooler winter months and often improves with the warmth of summer. Eczema usually starts showing signs in infancy. Some children outgrow eczema, but it may last through adulthood.  CAUSES  The exact cause of eczema is not known, but it appears to run in families. People with eczema often have a family history of eczema, allergies, asthma, or hay fever. Eczema is not contagious. Flare-ups of the condition may be caused by:   Contact with something you are sensitive or allergic to.   Stress. SIGNS AND SYMPTOMS  Dry, scaly skin.   Red, itchy rash.   Itchiness. This may occur before the skin rash and may be very intense.  DIAGNOSIS  The diagnosis of eczema is usually made based on symptoms and medical history. TREATMENT  Eczema cannot be cured, but symptoms usually can be controlled with treatment and other strategies. A treatment plan might include:  Controlling the itching and scratching.   Use over-the-counter antihistamines as directed for itching. This is especially useful at night when the itching tends to be worse.   Use over-the-counter steroid creams as directed for itching.   Avoid scratching. Scratching makes the rash and itching worse. It may also result in a skin infection (impetigo) due to a break in the skin caused by scratching.   Keeping the skin well moisturized with creams every day. This will seal in moisture and help prevent dryness. Lotions that contain alcohol and water should be avoided because they can dry the skin.   Limiting exposure to things that you are sensitive or allergic to (allergens).   Recognizing situations that cause stress.   Developing a plan to manage stress.  HOME CARE INSTRUCTIONS   Only take over-the-counter or  prescription medicines as directed by your health care provider.   Do not use anything on the skin without checking with your health care provider.   Keep baths or showers short (5 minutes) in warm (not hot) water. Use mild cleansers for bathing. These should be unscented. You may add nonperfumed bath oil to the bath water. It is best to avoid soap and bubble bath.   Immediately after a bath or shower, when the skin is still damp, apply a moisturizing ointment to the entire body. This ointment should be a petroleum ointment. This will seal in moisture and help prevent dryness. The thicker the ointment, the better. These should be unscented.   Keep fingernails cut short. Children with eczema may need to wear soft gloves or mittens at night after applying an ointment.   Dress in clothes made of cotton or cotton blends. Dress lightly, because heat increases itching.   A child with eczema should stay away from anyone with fever blisters or cold sores. The virus that causes fever blisters (herpes simplex) can cause a serious skin infection in children with eczema. SEEK MEDICAL CARE IF:   Your itching interferes with sleep.   Your rash gets worse or is not better within 1 week after starting treatment.   You see pus or soft yellow scabs in the rash area.   You have a fever.   You have a rash flare-up after contact with someone who has fever blisters.    This information is not intended to replace advice given to you by your health care   provider. Make sure you discuss any questions you have with your health care provider.   Document Released: 11/15/2000 Document Revised: 09/08/2013 Document Reviewed: 06/21/2013 Elsevier Interactive Patient Education 2016 Elsevier Inc.  

## 2016-06-03 ENCOUNTER — Encounter (HOSPITAL_COMMUNITY): Payer: Self-pay | Admitting: Emergency Medicine

## 2016-06-03 ENCOUNTER — Emergency Department (HOSPITAL_COMMUNITY)
Admission: EM | Admit: 2016-06-03 | Discharge: 2016-06-03 | Disposition: A | Discharge status: Home / Self Care | Payer: Medicaid Other | Attending: Emergency Medicine | Admitting: Emergency Medicine

## 2016-06-03 DIAGNOSIS — Z87891 Personal history of nicotine dependence: Secondary | ICD-10-CM | POA: Insufficient documentation

## 2016-06-03 DIAGNOSIS — R21 Rash and other nonspecific skin eruption: Secondary | ICD-10-CM

## 2016-06-03 MED ORDER — PERMETHRIN 5 % EX CREA: TOPICAL_CREAM | CUTANEOUS | Status: DC

## 2016-06-03 MED ORDER — HYDROXYZINE HCL 10 MG PO TABS: 10.0000 mg | ORAL_TABLET | Freq: Three times a day (TID) | ORAL | Status: DC | PRN

## 2016-06-03 NOTE — ED Notes (Signed)
Pt reports generalized itchy/painful body rash for the past 2 weeks that is not improving.

## 2016-06-03 NOTE — ED Notes (Signed)
PT DISCHARGED. INSTRUCTIONS AND PRESCRIPTIONS GIVEN. AAOX4. PT IN NO APPARENT DISTRESS OR PAIN. THE OPPORTUNITY TO ASK QUESTIONS WAS PROVIDED. 

## 2016-06-03 NOTE — ED Provider Notes (Signed)
CSN: [id number]     Arrival date & time 06/03/16  1336 History  By signing my name below, I, Gwenlyn Fudge, attest that this documentation has been prepared under the direction and in the presence of [employer], PA. Electronically Signed: Gwenlyn Fudge, ED Scribe. 06/03/2016. 2:27 PM.   Chief Complaint  Patient presents with  . Rash    The history is provided by the patient. No language interpreter was used.    HPI Comments: Victoria Harvey is a 35 y.o. female who presents to the Emergency Department complaining of rash bilaterally on the posterior of thighs and arms as well as chest.  Pt states she shares a bed with her daughter who does not have similar symptoms. Pt reports that it becomes worse at night. Pt was seen on 05/20/2016 for the same complaint and was prescribed an ointment with no relief to symptoms. Pt denies fever.   Past Medical History  Diagnosis Date  . Pinched nerve     in back; recently completed course of meds for sxs  . Strep sore throat   . Pneumonia    Past Surgical History  Procedure Laterality Date  . No past surgeries    . Wisdom tooth extraction     Family History  Problem Relation Age of Onset  . Hypertension Mother   . Hypertension Father   . Cancer Maternal Grandmother     breast  . Stroke Paternal Grandmother    Social History  Substance Use Topics  . Smoking status: Former Smoker -- 0.00 packs/day    Types: Cigarettes    Quit date: 11/24/2014  . Smokeless tobacco: Never Used  . Alcohol Use: No   OB History    Gravida Para Term Preterm AB TAB SAB Ectopic Multiple Living   2 2 2  0 0 0 0 0 0 2     Review of Systems A complete 10 system review of systems was obtained and all systems are negative except as noted in the HPI and PMH.    Allergies  Levofloxacin  Home Medications   Prior to Admission medications   Medication Sig Start Date End Date Taking? Authorizing Provider  albuterol (PROVENTIL HFA;VENTOLIN HFA) 108 (90 Base)  MCG/ACT inhaler Inhale 2 puffs into the lungs every 4 (four) hours as needed for wheezing or shortness of breath (cough). 04/22/16   Mercedes Camprubi-Soms, PA-C  azithromycin (ZITHROMAX) 250 MG tablet Take 2 today then 1 days 2-5 04/26/16   Argentina Donovan, PA-C  hydrocortisone-pramoxine [facility]) rectal foam Place 1 applicator rectally 2 (two) times daily. Patient not taking: Reported on 04/22/2016 08/30/15   Paticia Stack, PA-C  hydrOXYzine (ATARAX/VISTARIL) 10 MG tablet Take 1 tablet (10 mg total) by mouth 3 (three) times daily as needed for itching. 06/03/16   Karlyn Glasco, PA-C  ibuprofen (ADVIL,MOTRIN) 200 MG tablet Take 200-400 mg by mouth every 6 (six) hours as needed for headache or moderate pain.    Historical Provider, MD  ibuprofen (ADVIL,MOTRIN) 800 MG tablet Take 1 tablet (800 mg total) by mouth 3 (three) times daily. 11/03/15   Larene Pickett, PA-C  permethrin (ELIMITE) 5 % cream Apply to entire body other than face - let sit for 14 hours then wash off, may repeat in 1 week if still having symptoms 06/03/16   Elmyra Ricks Kortnie Stovall, PA-C  predniSONE (DELTASONE) 20 MG tablet Take 1 tablet (20 mg total) by mouth daily. 05/20/16   Gloriann Loan, PA-C  triamcinolone cream (KENALOG) 0.1 %  Apply two times daily to affected areas on your arms and legs.  Do not apply to face. 05/20/16   Kayla Rose, PA-C   BP 151/93 mmHg  Pulse 80  Temp(Src) 98.2 F (36.8 C) (Oral)  Resp 16  SpO2 100% Physical Exam  Constitutional: She is oriented to person, place, and time. She appears well-developed and well-nourished.  HENT:  Head: Normocephalic and atraumatic.  Mouth/Throat: Oropharynx is clear and moist.  Eyes: Conjunctivae and EOM are normal. Pupils are equal, round, and reactive to light.  Neck: Normal range of motion. Neck supple.     Cardiovascular: Normal rate, regular rhythm and intact distal pulses.   Pulmonary/Chest: Effort normal and breath sounds normal. No respiratory distress. She has  no wheezes. She has no rales. She exhibits no tenderness.  Abdominal: Soft. Bowel sounds are normal. She exhibits no distension and no mass. There is no tenderness. There is no rebound and no guarding.  Musculoskeletal: Normal range of motion. She exhibits tenderness. She exhibits no edema.  Neurological: She is alert and oriented to person, place, and time.  Strength 5/5 x4 extremities   Distal sensation intact  Skin: Skin is warm.  Plaque-like rash to the flexor surfaces of arms and legs, rashes blanch: Spares the palms and soles, she also has linear lesions in the intertriginous spaces. No oral mucosa involvement.  Psychiatric: She has a normal mood and affect.  Nursing note and vitals reviewed.   ED Course  Procedures (including critical care time)  DIAGNOSTIC STUDIES: Oxygen Saturation is 100% on RA, normal by my interpretation.    COORDINATION OF CARE: 2:26 PM Discussed treatment plan with pt at bedside which includes topical cream and Atarax and pt agreed to plan.    EKG Interpretation None      MDM   Final diagnoses:  Rash and nonspecific skin eruption    Filed Vitals:   06/03/16 1342  BP: 151/93  Pulse: 80  Temp: 98.2 F (36.8 C)  TempSrc: Oral  Resp: 16  SpO2: 100%     Victoria Harvey is 35 y.o. female presenting with Pruritic rash onset 2 weeks ago, patient states she sleeps with her daughter and her daughter is unaffected, patient without fever, no red flags, the rash is consistent with a eczema however, she is been treated aggressively for this with topical triamcinolone and IM steroids with little relief, we will encourage close follow-up with dermatology, patient will be treated with permethrin and Atarax for comfort.  Evaluation does not show pathology that would require ongoing emergent intervention or inpatient treatment. Pt is hemodynamically stable and mentating appropriately. Discussed findings and plan with patient/guardian, who agrees with care  plan. All questions answered. Return precautions discussed and outpatient follow up given.   Discharge Medication List as of 06/03/2016  2:29 PM    START taking these medications   Details  hydrOXYzine (ATARAX/VISTARIL) 10 MG tablet Take 1 tablet (10 mg total) by mouth 3 (three) times daily as needed for itching., Starting 06/03/2016, Until Discontinued, Print    permethrin (ELIMITE) 5 % cream Apply to entire body other than face - let sit for 14 hours then wash off, may repeat in 1 week if still having symptoms, Print         I personally performed the services described in this documentation, which was scribed in my presence. The recorded information has been reviewed and is accurate.   Monico Blitz, PA-C 06/03/16 1826  Lacretia Leigh, MD 06/04/16 1710

## 2016-06-03 NOTE — Discharge Instructions (Signed)
Do not hesitate to return to the emergency room for any new, worsening or concerning symptoms.  Please obtain primary care using resource guide below. Let them know that you were seen in the emergency room and that they will need to obtain records for further outpatient management.   [employer] 211 is a great source of information about community services available.  Access by dialing 2-1-1 from anywhere in [place], or by website -  CustodianSupply.fi.   Other Local Resources (Updated 12/2015)  [place]    Phone Number and Address  [place] medical care - 1st and 3rd Saturday of every month  Must not qualify for public or private insurance and must have limited income [phone] [address], [place]  Child care  Emergency assistance for housing and [employer]  Medicaid [phone] [address], [place] [address]   [facility] Department  Low-cost medical care for children, communicable diseases, sexually-transmitted diseases, immunizations, maternity care, womens health and family planning [phone] [address], [place] [address]  [facility]   Medication assistance for [facility] residents  Must meet income requirements [phone] [place], [place].    [place]  Child care  Emergency assistance for housing and [employer]  Medicaid [phone] [address] [place], [place] [address]  [facility] and [place]   Low-cost medical care,   Monday through Friday, 9 am to 6 pm.   Accepts Medicare/Medicaid, and self-pay [phone] [address] [place], [place] [address]  [facility] for [place] care - Monday through Friday, 8:30 am - 5:30 pm  Accepts Medicaid and self-pay [phone] [address] [place], [place] [address]   [facility]  Primary medical care, including for those with sickle cell disease  Accepts Medicare, Medicaid, insurance and self-pay [phone] [place], [place]  [facility]   Primary medical care  Accepts Medicare, [place], insurance and self-pay [phone] [address] [place], [place] [address]   [facility] Department of Social Services  Child care  Emergency assistance for housing and [employer]  Medicaid [phone] [address] [place], [place] [address]  [place] Department of Health and [employer]  Child care  Emergency assistance for housing and [employer]  Medicaid [phone] [place], [place] [address]   [facility] Medication Assistance Program  Medication assistance for [facility] residents with no insurance only  Must have a primary care doctor [phone] [address], [place], [place]  [facility]   Primary medical care  [place], [place], insurance  [phone] [address] [place], [place]  [facility]   Medication assistance [phone]  [facility]   Primary medical care  Accepts Medicare, [place], insurance and self-pay [phone] [address], [place] [address]  [facility]   Primary medical care  Accepts Medicare, [place], insurance and self-pay [phone] [address] [place],  [address]  [facility]  For [place] residents between the ages of 69 and 28 who do not have any form of health insurance, Medicare, [place], or [place] benefits.  Services are provided free of charge to uninsured patients who fall within  federal poverty guidelines.    Hours: Tuesdays and Thursdays, 4:15 - 8 pm [phone] [address], [place], [place] [address]  [facility]     Primary medical care  Dental care  Nutritional counseling  Pharmacy  Accepts Medicaid, Medicare, most insurance.  Fees are adjusted based on ability to pay.   [place], [place] [address], [place], [place], [place], [place] [facility] [place], [place]  [facility]  Womens health and family planning [phone] [place]. [place], [place] care  Emergency assistance for housing and [employer]  Medicaid [phone] [address] [place], [place] [address]   [facility]    Ages 36 and older  Hours: Mondays and Thursdays, 7:00 am - 9:00 am Patients are seen on a first come, first served basis. [phone], ext. [place], [place]  Child care  Emergency assistance for housing and [employer]  Medicaid [phone] [address], [place] [address]  [facility] [place]  Medication assistance  Rental assistance  Food pantry  Medication assistance  Housing assistance  Emergency food distribution  Utility assistance [place], [place]  [place]. [place], [place] [address] Hours: Tuesdays and Thursdays from 9am - 12 noon by appointment only  [place], [place] [address]  [facility] [place] private insurance, [place], and [place].  Payment is based on a sliding scale for those without insurance.  Hours: Mondays, Tuesdays and Thursdays, 8:30 am - 5:30 pm.   [phone] [place], [place]  [facility] - Family Medicine at [facility], [place], and [place].  Payment is based on a sliding scale for those without insurance. [phone] [address] [place], [place]  [facility] - Pediatrics at [address], research (medical), [employer], and [place].  Payment is based on a sliding scale for those without insurance [phone] [address] [place], [employer], [place]  [facility] - Pediatrics at [employer], [place], and [place].  Payment is based on a sliding scale for those without insurance. [phone] [place], [place]  [facility] - Pediatrics at [facility], [place], and [place].  Payment is based on a sliding scale for those without insurance. [phone], ext. [phone] [place], [place].    [place] care.  Accepts Medicaid and self-pay. [place], [place]

## 2016-06-25 ENCOUNTER — Emergency Department (HOSPITAL_COMMUNITY)
Admission: EM | Admit: 2016-06-25 | Discharge: 2016-06-25 | Disposition: A | Discharge status: Home / Self Care | Payer: Medicaid Other | Attending: Emergency Medicine | Admitting: Emergency Medicine

## 2016-06-25 ENCOUNTER — Encounter (HOSPITAL_COMMUNITY): Payer: Self-pay | Admitting: Emergency Medicine

## 2016-06-25 DIAGNOSIS — K0889 Other specified disorders of teeth and supporting structures: Secondary | ICD-10-CM | POA: Insufficient documentation

## 2016-06-25 DIAGNOSIS — R21 Rash and other nonspecific skin eruption: Secondary | ICD-10-CM

## 2016-06-25 DIAGNOSIS — Z87891 Personal history of nicotine dependence: Secondary | ICD-10-CM | POA: Insufficient documentation

## 2016-06-25 MED ORDER — PERMETHRIN 5 % EX CREA: TOPICAL_CREAM | CUTANEOUS | 1 refills | Status: DC

## 2016-06-25 MED ORDER — PENICILLIN V POTASSIUM 500 MG PO TABS: 500.0000 mg | ORAL_TABLET | Freq: Four times a day (QID) | ORAL | 0 refills | Status: DC

## 2016-06-25 NOTE — ED Notes (Signed)
Here for right lower dental pain x 2 days. Also, here for itching rash to arms, legs and feet x several weeks.

## 2016-06-25 NOTE — ED Triage Notes (Signed)
Pt here for right sided dental pain and generalized rash that is itchy

## 2016-06-25 NOTE — ED Provider Notes (Signed)
[facility] DEPT Provider Note   CSN: [id number] Arrival date & time: 06/25/16  1458  First Provider Contact:  None    By signing my name below, I, [name], attest that this documentation has been prepared under the direction and in the presence of Montine Circle, PA-C. Electronically Signed: [name], ED Scribe. 06/25/16. 3:18 PM.   History   Chief Complaint Chief Complaint  Patient presents with  . Dental Pain  . Rash    HPI [facility] is a 35 y.o. female who presents to the Emergency Department complaining of moderate right-sided dental pain onset 2 days ago. Pt states she has tried Tylenol, ibuprofen and oragel with no relief of pain. She states her pain is worsened with swallowing and lying flat. She denies fever, difficulty swallowing or tolerating secretions. She is not currently followed by a dentist.   She also complains of a pruritic rash to her torso and extremities onset weeks ago. She states she has tried oatmeal baths with no relief of symptoms. No new soaps, lotions or detergents. No worsening factors noted.   The history is provided by the patient. No language interpreter was used.    Past Medical History:  Diagnosis Date  . Pinched nerve    in back; recently completed course of meds for sxs  . Pneumonia   . Strep sore throat     Patient Active Problem List   Diagnosis Date Noted  . NVD (normal vaginal delivery) 08/20/2015  . Labor and delivery, indication for care 08/19/2015  . Significant discrepancy between uterine size and clinical dates, antepartum   . [redacted] weeks gestation of pregnancy   . Pneumonia 04/26/2015  . Pneumonia affecting pregnancy in second trimester 04/26/2015  . Abdominal pain affecting pregnancy 01/25/2015    Past Surgical History:  Procedure Laterality Date  . NO PAST SURGERIES    . WISDOM TOOTH EXTRACTION      OB History    Gravida Para Term Preterm AB Living   2 2 2  0 0 2   SAB TAB Ectopic Multiple Live Births     0 0 0 0         Home Medications    Prior to Admission medications   Medication Sig Start Date End Date Taking? Authorizing Provider  albuterol (PROVENTIL HFA;VENTOLIN HFA) 108 (90 Base) MCG/ACT inhaler Inhale 2 puffs into the lungs every 4 (four) hours as needed for wheezing or shortness of breath (cough). 04/22/16   Mercedes Camprubi-Soms, PA-C  azithromycin (ZITHROMAX) 250 MG tablet Take 2 today then 1 days 2-5 04/26/16   Argentina Donovan, PA-C  hydrocortisone-pramoxine [facility]) rectal foam Place 1 applicator rectally 2 (two) times daily. Patient not taking: Reported on 04/22/2016 08/30/15   Paticia Stack, PA-C  hydrOXYzine (ATARAX/VISTARIL) 10 MG tablet Take 1 tablet (10 mg total) by mouth 3 (three) times daily as needed for itching. 06/03/16   Nicole Pisciotta, PA-C  ibuprofen (ADVIL,MOTRIN) 200 MG tablet Take 200-400 mg by mouth every 6 (six) hours as needed for headache or moderate pain.    Historical Provider, MD  ibuprofen (ADVIL,MOTRIN) 800 MG tablet Take 1 tablet (800 mg total) by mouth 3 (three) times daily. 11/03/15   Larene Pickett, PA-C  permethrin (ELIMITE) 5 % cream Apply to entire body other than face - let sit for 14 hours then wash off, may repeat in 1 week if still having symptoms 06/03/16   Elmyra Ricks Pisciotta, PA-C  predniSONE (DELTASONE) 20 MG tablet  Take 1 tablet (20 mg total) by mouth daily. 05/20/16   Gloriann Loan, PA-C  triamcinolone cream (KENALOG) 0.1 % Apply two times daily to affected areas on your arms and legs.  Do not apply to face. 05/20/16   Gloriann Loan, PA-C    Family History Family History  Problem Relation Age of Onset  . Hypertension Mother   . Hypertension Father   . Cancer Maternal Grandmother     breast  . Stroke Paternal Grandmother     Social History Social History  Substance Use Topics  . Smoking status: Former Smoker    Packs/day: 0.00    Types: Cigarettes    Quit date: 11/24/2014  . Smokeless tobacco: Never Used  . Alcohol use No      Allergies   Levofloxacin   Review of Systems Review of Systems  Constitutional: Negative for fever.  HENT: Positive for dental problem. Negative for trouble swallowing.   Skin: Positive for rash.     Physical Exam Updated Vital Signs BP 153/99 (BP Location: Left Arm)   Pulse 98   Temp 98.6 F (37 C) (Oral)   Resp 18   Ht 5\' 7"  (1.702 m)   Wt 155 lb (70.3 kg)   SpO2 100%   BMI 24.28 kg/m   Physical Exam Physical Exam  Constitutional: Pt appears well-developed and well-nourished.  HENT:  Head: Normocephalic.  Right Ear: Tympanic membrane, external ear and ear canal normal.  Left Ear: Tympanic membrane, external ear and ear canal normal.  Nose: Nose normal. Right sinus exhibits no maxillary sinus tenderness and no frontal sinus tenderness. Left sinus exhibits no maxillary sinus tenderness and no frontal sinus tenderness.  Mouth/Throat: Uvula is midline, oropharynx is clear and moist and mucous membranes are normal. No oral lesions. No uvula swelling or lacerations. No oropharyngeal exudate, posterior oropharyngeal edema, posterior oropharyngeal erythema or tonsillar abscesses.  Poor dentition No gingival swelling, fluctuance or induration No gross abscess  No sublingual edema, tenderness to palpation, or sign of Ludwig's angina, or deep space infection Pain at right lower rear molars Eyes: Conjunctivae are normal. Pupils are equal, round, and reactive to light. Right eye exhibits no discharge. Left eye exhibits no discharge.  Neck: Normal range of motion. Neck supple.  No stridor Handling secretions without difficulty No nuchal rigidity No cervical lymphadenopathy Cardiovascular: Normal rate, regular rhythm and normal heart sounds.   Pulmonary/Chest: Effort normal. No respiratory distress.  Equal chest rise  Abdominal: Soft. Bowel sounds are normal. Pt exhibits no distension. There is no tenderness.  Lymphadenopathy: Pt has no cervical adenopathy.  Neurological:  Pt is alert and oriented x 4  Skin: Skin is warm and dry. Rash on extremities ?scabies Psychiatric: Pt has a normal mood and affect.  Nursing note and vitals reviewed.    ED Treatments / Results   Procedures Procedures (including critical care time)  DIAGNOSTIC STUDIES: Oxygen Saturation is 100% on RA, normal by my interpretation.    COORDINATION OF CARE: 3:17 PM Discussed treatment plan with pt at bedside which includes antibiotics, topical ointment, dental f/u, dermatology f/u if rash does not improve and pt agreed to plan.    Medications Ordered in ED Medications - No data to display   Initial Impression / Assessment and Plan / ED Course  I have reviewed the triage vital signs and the nursing notes.  Pertinent labs & imaging results that were available during my care of the patient were reviewed by me and considered in my medical  decision making (see chart for details).  Clinical Course    Patient with dentalgia.  No abscess requiring immediate incision and drainage.  Exam not concerning for Ludwig's angina or pharyngeal abscess.  Will treat with penicillin. Pt instructed to follow-up with dentist.  Discussed return precautions. Pt safe for discharge.  Patient also has itchy rash on extremities x months.  ? Scabies vs eczema.  Recommend dermatology f/u.  Will try permethrin.  Final Clinical Impressions(s) / ED Diagnoses   Final diagnoses:  Rash  Pain, dental    New Prescriptions New Prescriptions   PENICILLIN V POTASSIUM (VEETID) 500 MG TABLET    Take 1 tablet (500 mg total) by mouth 4 (four) times daily.   PERMETHRIN (ELIMITE) 5 % CREAM    Apply to entire body other than face - let sit for 14 hours then wash off, may repeat in 1 week if still having symptoms Do not breastfeed while using treatment  I personally performed the services described in this documentation, which was scribed in my presence. The recorded information has been reviewed and is accurate.        Montine Circle, PA-C 06/25/16 [place], MD 06/29/16 [phone]

## 2016-08-27 ENCOUNTER — Encounter: Payer: Self-pay | Admitting: *Deleted

## 2016-08-27 IMAGING — CR DG CHEST 2V
2 series · 2 of 2 positions shown · non-contrast
Comparison: Chest x-ray dated 04/28/2015.

CLINICAL DATA: Central chest pain, worse with coughing for 4 days,
shortness of breath.

EXAM:
CHEST  2 VIEW

[w chest pa]
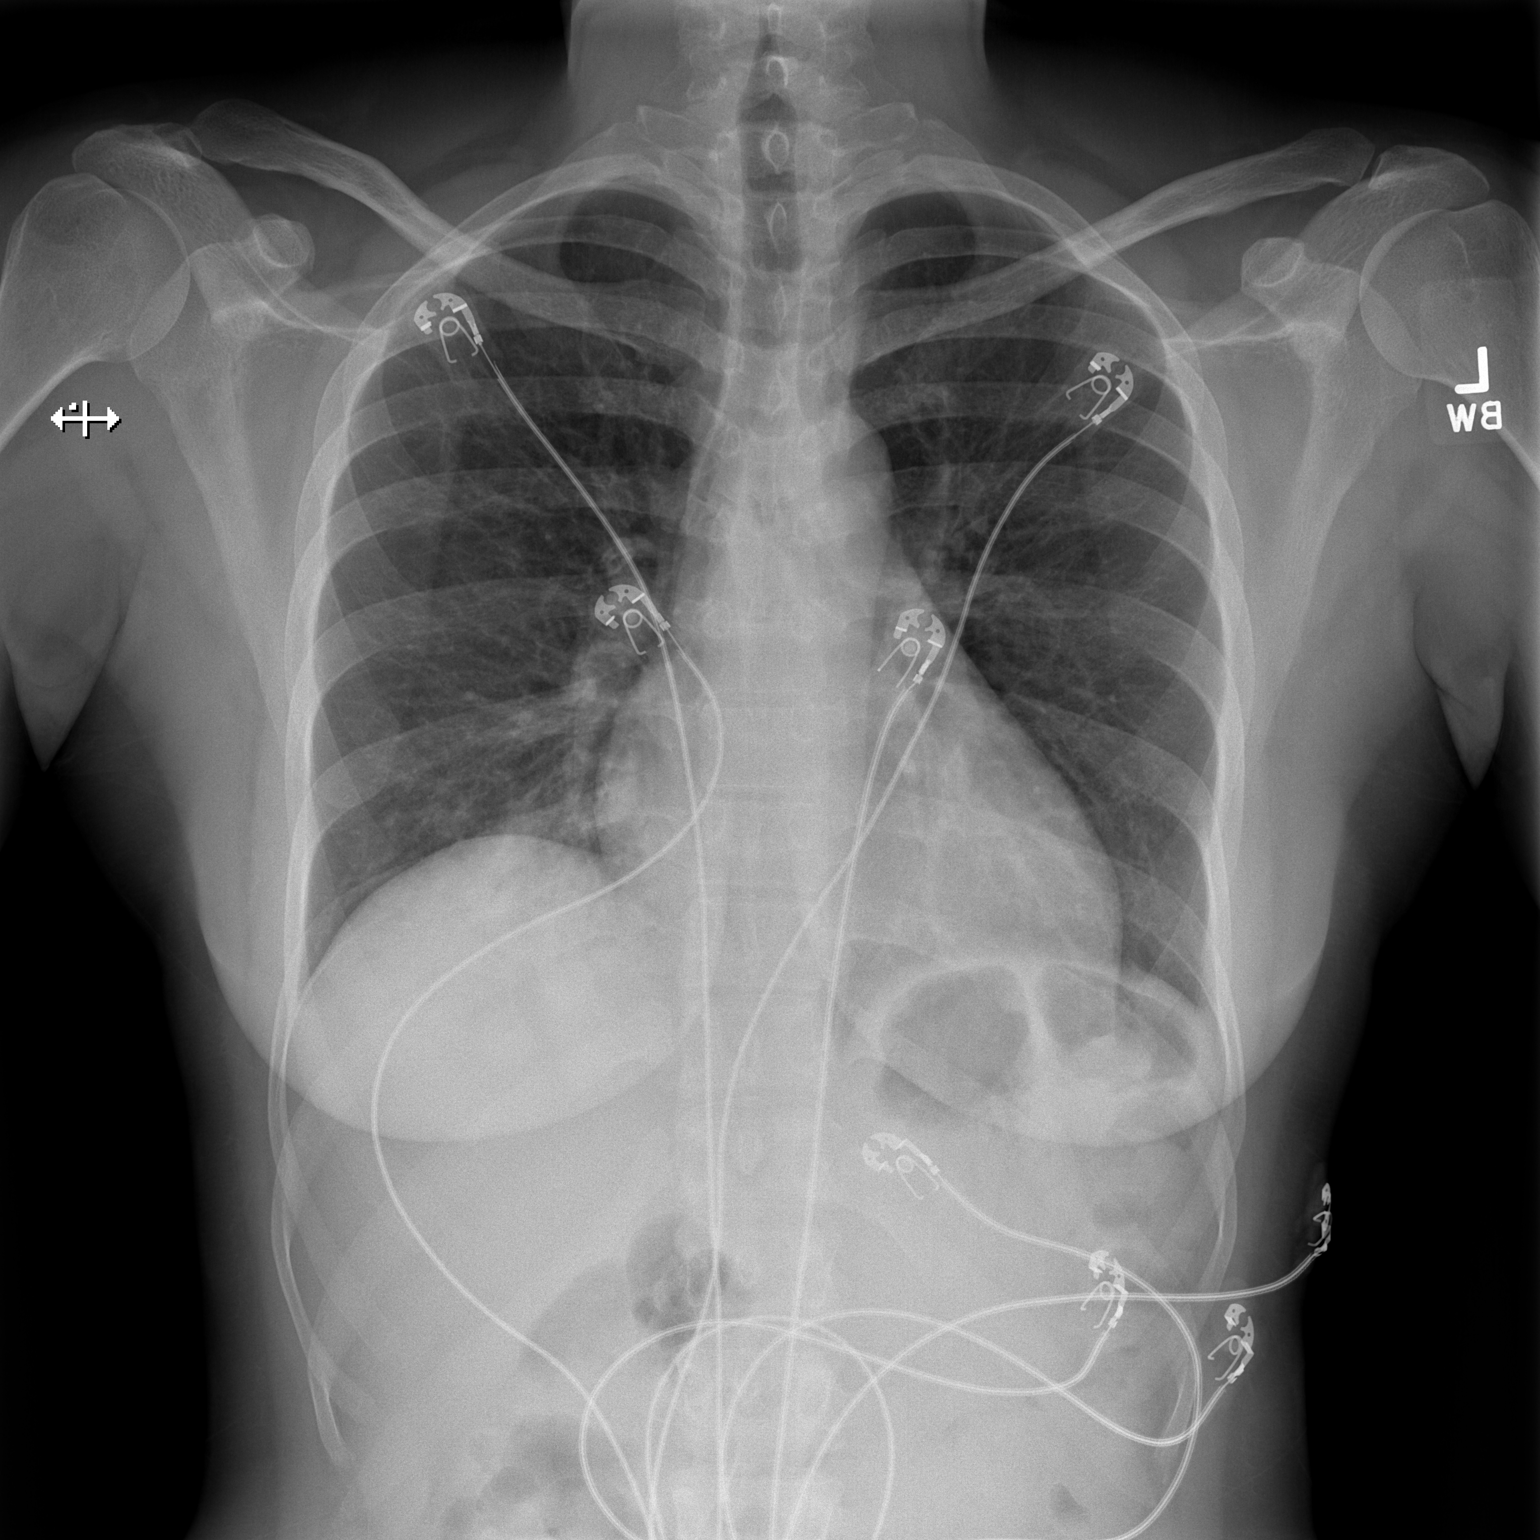

[w chest lat]
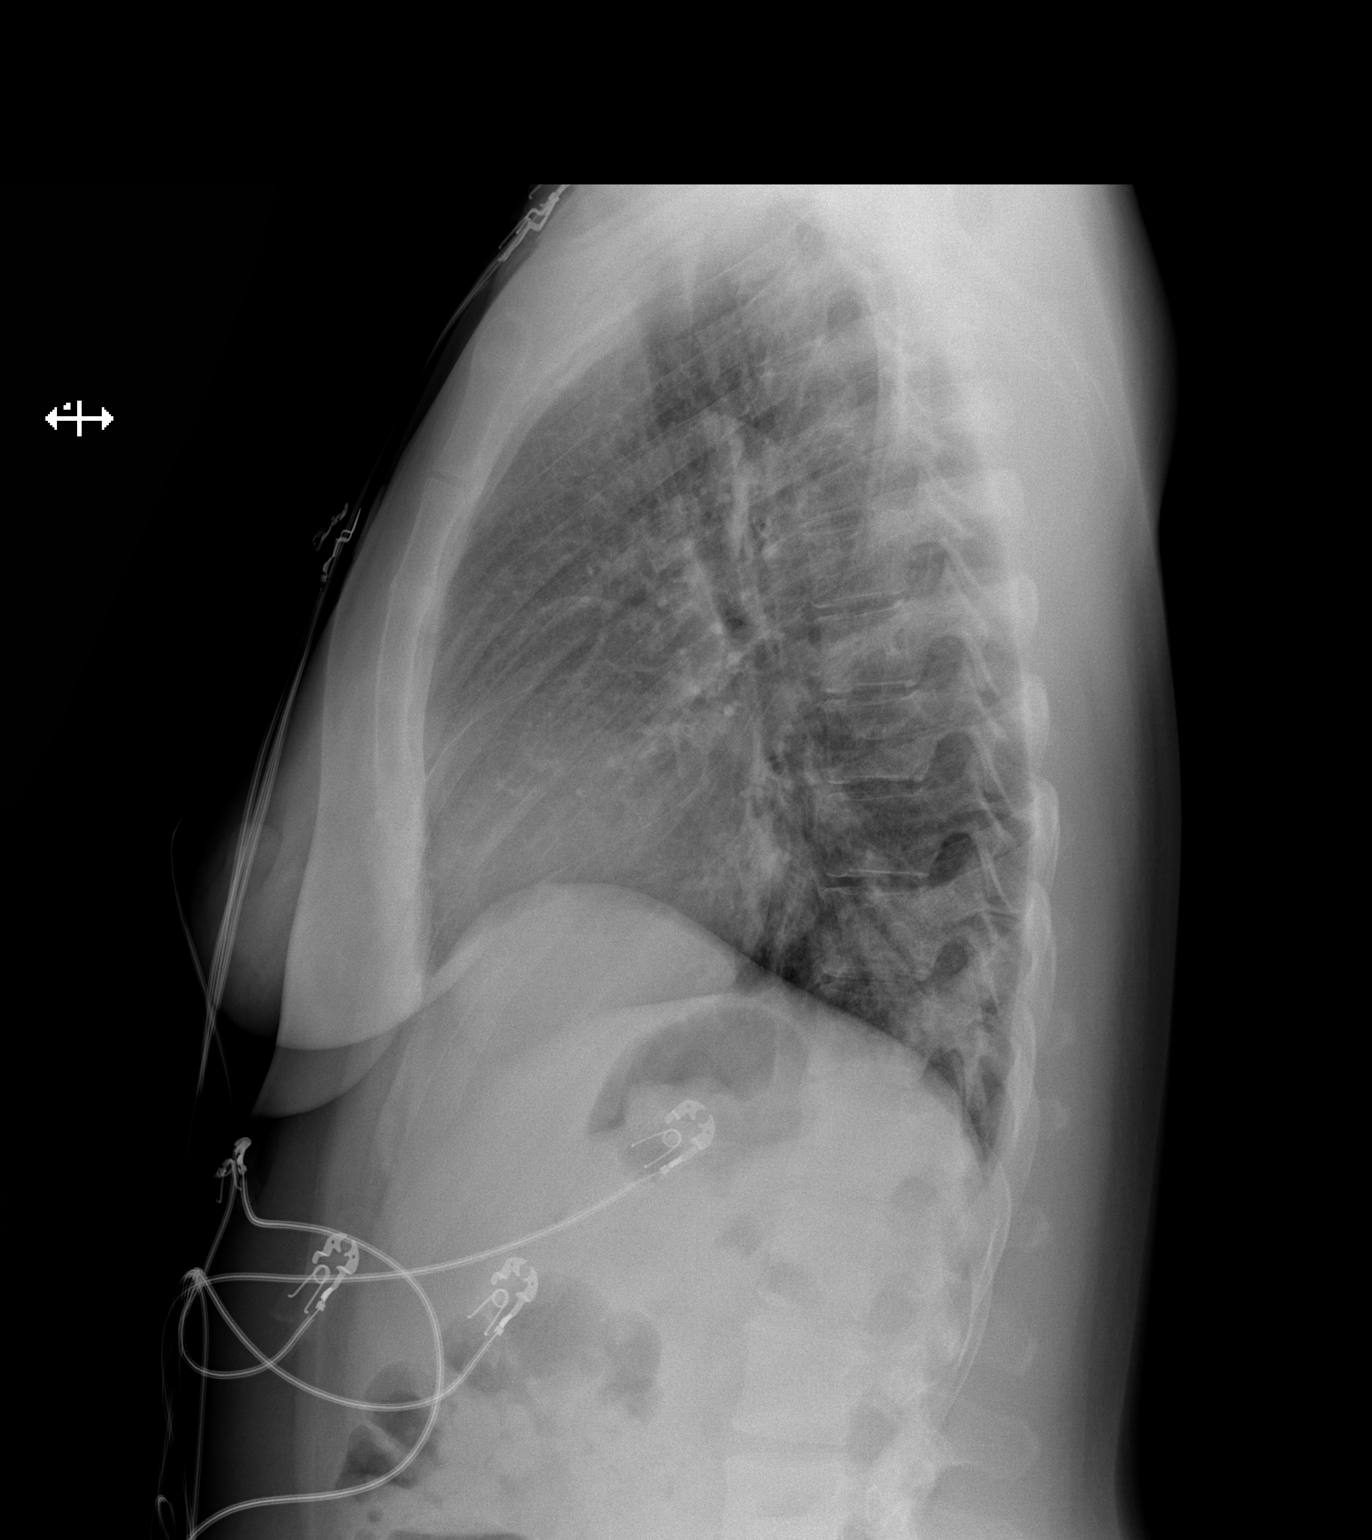

[2 of 2 positions shown; findings below may reference images not displayed]

FINDINGS: New airspace opacity at the right lung base posteriorly, compatible
with pneumonia. Lungs otherwise clear. Heart size is normal.

There is stable mild elevation of the right hemidiaphragm. Osseous
and soft tissue structures about the chest are otherwise
unremarkable.
IMPRESSION: Right lower lobe pneumonia.

## 2016-10-07 ENCOUNTER — Encounter (HOSPITAL_COMMUNITY): Payer: Self-pay | Admitting: Emergency Medicine

## 2016-10-07 ENCOUNTER — Emergency Department (HOSPITAL_COMMUNITY)
Admission: EM | Admit: 2016-10-07 | Discharge: 2016-10-07 | Disposition: A | Discharge status: Home / Self Care | Payer: Self-pay | Attending: Emergency Medicine | Admitting: Emergency Medicine

## 2016-10-07 ENCOUNTER — Emergency Department (HOSPITAL_COMMUNITY): Payer: Self-pay

## 2016-10-07 DIAGNOSIS — M67431 Ganglion, right wrist: Secondary | ICD-10-CM | POA: Insufficient documentation

## 2016-10-07 DIAGNOSIS — Z87891 Personal history of nicotine dependence: Secondary | ICD-10-CM | POA: Insufficient documentation

## 2016-10-07 MED ORDER — DICLOFENAC SODIUM 50 MG PO TBEC: 50.0000 mg | DELAYED_RELEASE_TABLET | Freq: Two times a day (BID) | ORAL | 0 refills | Status: DC

## 2016-10-07 NOTE — ED Provider Notes (Signed)
[facility] DEPT Provider Note   CSN: [id number] Arrival date & time: 10/07/16  1745  By signing my name below, I, Soijett Blue, attest that this documentation has been prepared under the direction and in the presence of Debroah Baller, NP Electronically Signed: Soijett Blue, ED Scribe. 10/07/16. 7:18 PM.   History   Chief Complaint Chief Complaint  Patient presents with  . Wrist Pain    HPI  [facility] is a 35 y.o. female who presents to the Emergency Department complaining of right wrist pain onset 3 days. Pt reports that she has had the knot to her right wrist for "awhile" that hasn't bothered her until recently. Pt states that she where she works she completes repetitive movements daily. Pt denies recent injury or trauma. Pt reports that she is right hand dominant. Pt is having associated symptoms of knot to right wrist. She notes that she has not tried any medications for the relief of her symptoms. She denies color change, wound, rash, swelling, and any other symptoms.    The history is provided by the patient. No language interpreter was used.  Wrist Pain  This is a new problem. Episode onset: 3 days. The problem occurs constantly. The problem has not changed since onset.The symptoms are aggravated by bending. Nothing relieves the symptoms. She has tried nothing for the symptoms. The treatment provided no relief.    Past Medical History:  Diagnosis Date  . Pinched nerve    in back; recently completed course of meds for sxs  . Pneumonia   . Strep sore throat     Patient Active Problem List   Diagnosis Date Noted  . NVD (normal vaginal delivery) 08/20/2015  . Labor and delivery, indication for care 08/19/2015  . Significant discrepancy between uterine size and clinical dates, antepartum   . [redacted] weeks gestation of pregnancy   . Pneumonia 04/26/2015  . Pneumonia affecting pregnancy in second trimester 04/26/2015  . Abdominal pain affecting pregnancy 01/25/2015     Past Surgical History:  Procedure Laterality Date  . NO PAST SURGERIES    . WISDOM TOOTH EXTRACTION      OB History    Gravida Para Term Preterm AB Living   2 2 2  0 0 2   SAB TAB Ectopic Multiple Live Births   0 0 0 0 2       Home Medications    Prior to Admission medications   Medication Sig Start Date End Date Taking? Authorizing Provider  albuterol (PROVENTIL HFA;VENTOLIN HFA) 108 (90 Base) MCG/ACT inhaler Inhale 2 puffs into the lungs every 4 (four) hours as needed for wheezing or shortness of breath (cough). 04/22/16   Mercedes Camprubi-Soms, PA-C  azithromycin (ZITHROMAX) 250 MG tablet Take 2 today then 1 days 2-5 04/26/16   Argentina Donovan, PA-C  diclofenac (VOLTAREN) 50 MG EC tablet Take 1 tablet (50 mg total) by mouth 2 (two) times daily. 10/07/16   Sherill Mangen Bunnie Pion, NP  hydrocortisone-pramoxine [facility]) rectal foam Place 1 applicator rectally 2 (two) times daily. Patient not taking: Reported on 04/22/2016 08/30/15   Paticia Stack, PA-C  hydrOXYzine (ATARAX/VISTARIL) 10 MG tablet Take 1 tablet (10 mg total) by mouth 3 (three) times daily as needed for itching. 06/03/16   Nicole Pisciotta, PA-C  ibuprofen (ADVIL,MOTRIN) 200 MG tablet Take 200-400 mg by mouth every 6 (six) hours as needed for headache or moderate pain.    Historical Provider, MD  ibuprofen (ADVIL,MOTRIN) 800 MG tablet Take 1  tablet (800 mg total) by mouth 3 (three) times daily. 11/03/15   Larene Pickett, PA-C  penicillin v potassium (VEETID) 500 MG tablet Take 1 tablet (500 mg total) by mouth 4 (four) times daily. 06/25/16   Montine Circle, PA-C  permethrin (ELIMITE) 5 % cream Apply to entire body other than face - let sit for 14 hours then wash off, may repeat in 1 week if still having symptoms Do not breastfeed while using treatment 06/25/16   Montine Circle, PA-C  predniSONE (DELTASONE) 20 MG tablet Take 1 tablet (20 mg total) by mouth daily. 05/20/16   Gloriann Loan, PA-C  triamcinolone cream (KENALOG)  0.1 % Apply two times daily to affected areas on your arms and legs.  Do not apply to face. 05/20/16   Gloriann Loan, PA-C    Family History Family History  Problem Relation Age of Onset  . Hypertension Mother   . Hypertension Father   . Cancer Maternal Grandmother     breast  . Stroke Paternal Grandmother     Social History Social History  Substance Use Topics  . Smoking status: Former Smoker    Packs/day: 0.00    Types: Cigarettes    Quit date: 11/24/2014  . Smokeless tobacco: Never Used  . Alcohol use No     Allergies   Levofloxacin   Review of Systems Review of Systems  Musculoskeletal: Positive for arthralgias (right wrist). Negative for joint swelling.  Skin: Negative for color change, rash and wound.       "knot" to right wrist    Physical Exam Updated Vital Signs BP 138/87 (BP Location: Left Arm)   Pulse 78   Temp 98.4 F (36.9 C) (Oral)   Resp 18   Ht 5\' 7"  (1.702 m)   Wt 70.3 kg   SpO2 100%   BMI 24.28 kg/m   Physical Exam  Constitutional: She is oriented to person, place, and time. She appears well-developed and well-nourished. No distress.  HENT:  Head: Normocephalic and atraumatic.  Eyes: EOM are normal.  Neck: Neck supple.  Cardiovascular: Normal rate.   Pulses:      Radial pulses are 2+ on the right side.  2+ radial pulse. Adequate circulation.   Pulmonary/Chest: Effort normal. No respiratory distress.  Abdominal: She exhibits no distension.  Musculoskeletal: Normal range of motion.       Right wrist: She exhibits normal range of motion.  Right wrist with full ROM. Good finger to thumb opposition. 1 cm raised, tender cystic area to the dorsum of the right wrist.   Neurological: She is alert and oriented to person, place, and time.  Skin: Skin is warm and dry.  Psychiatric: She has a normal mood and affect. Her behavior is normal.  Nursing note and vitals reviewed.    ED Treatments / Results  DIAGNOSTIC STUDIES: Oxygen Saturation is  100% on RA, nl by my interpretation.    COORDINATION OF CARE: 7:15 PM Discussed treatment plan with pt at bedside which includes right wrist xray, and pt agreed to plan.   Radiology Dg Wrist Complete Right  Result Date: 10/07/2016 CLINICAL DATA:  35 y/o F; sudden onset worsening pain in the right wrist at base of thumb with swelling. EXAM: RIGHT WRIST - COMPLETE 3+ VIEW COMPARISON:  None. FINDINGS: There is no evidence of fracture or dislocation. There is no evidence of arthropathy or other focal bone abnormality. IMPRESSION: No acute osseous abnormality is identified. Electronically Signed   By: Kristine Garbe  M.D.   On: 10/07/2016 19:53    Procedures Procedures (including critical care time)  Medications Ordered in ED Medications - No data to display   Initial Impression / Assessment and Plan / ED Course  I have reviewed the triage vital signs and the nursing notes.  Pertinent imaging results that were available during my care of the patient were reviewed by me and considered in my medical decision making (see chart for details).  Clinical Course   discussed with the patient most likely ganglion cyst and need for f/u with the hand surgeon if symptoms are worsening.   Patient X-Ray negative for obvious fracture or dislocation.  Pt advised to follow up with hand surgeon. Patient given wrist splint while in ED, conservative therapy recommended and discussed. Pt will be discharged home with voltaren Rx. Patient will be discharged home & is agreeable with above plan. Returns precautions discussed. Pt appears safe for discharge.  Final Clinical Impressions(s) / ED Diagnoses   Final diagnoses:  Ganglion of right wrist    New Prescriptions New Prescriptions   DICLOFENAC (VOLTAREN) 50 MG EC TABLET    Take 1 tablet (50 mg total) by mouth 2 (two) times daily.   I personally performed the services described in this documentation, which was scribed in my presence. The recorded  information has been reviewed and is accurate.     [address] [place], NP 10/09/16 0121    Tanna Furry, MD 10/29/16 1535

## 2016-10-07 NOTE — ED Triage Notes (Signed)
Pt c/o pain to right wrist for a few days.  No known injury, st's she has had a knot on top of right hand for a long time but has never had pain

## 2016-12-06 ENCOUNTER — Encounter (HOSPITAL_COMMUNITY): Payer: Self-pay | Admitting: *Deleted

## 2016-12-06 ENCOUNTER — Emergency Department (HOSPITAL_COMMUNITY)
Admission: EM | Admit: 2016-12-06 | Discharge: 2016-12-06 | Disposition: A | Discharge status: Home / Self Care | Payer: Medicaid Other | Attending: Emergency Medicine | Admitting: Emergency Medicine

## 2016-12-06 DIAGNOSIS — R55 Syncope and collapse: Secondary | ICD-10-CM | POA: Insufficient documentation

## 2016-12-06 DIAGNOSIS — Z79899 Other long term (current) drug therapy: Secondary | ICD-10-CM | POA: Insufficient documentation

## 2016-12-06 DIAGNOSIS — Z87891 Personal history of nicotine dependence: Secondary | ICD-10-CM | POA: Insufficient documentation

## 2016-12-06 LAB — BASIC METABOLIC PANEL
Anion gap: 9 (ref 5–15)
BUN: 10 mg/dL (ref 6–20)
CO2: 23 mmol/L (ref 22–32)
Calcium: 9.1 mg/dL (ref 8.9–10.3)
Chloride: 105 mmol/L (ref 101–111)
Creatinine, Ser: 0.89 mg/dL (ref 0.44–1.00)
GFR calc Af Amer: >60 mL/min (ref >60)
GFR calc non Af Amer: >60 mL/min (ref >60)
Glucose, Bld: 146 mg/dL — ABNORMAL HIGH (ref 65–99)
Potassium: 3.2 mmol/L — ABNORMAL LOW (ref 3.5–5.1)
Sodium: 137 mmol/L (ref 135–145)

## 2016-12-06 LAB — CBC
HCT: 37.3 % (ref 36.0–46.0)
Hemoglobin: 12.8 g/dL (ref 12.0–15.0)
MCH: 30.3 pg (ref 26.0–34.0)
MCHC: 34.3 g/dL (ref 30.0–36.0)
MCV: 88.2 fL (ref 78.0–100.0)
Platelets: 334 10*3/uL (ref 150–400)
RBC: 4.23 MIL/uL (ref 3.87–5.11)
RDW: 14 % (ref 11.5–15.5)
WBC: 8 10*3/uL (ref 4.0–10.5)

## 2016-12-06 LAB — CBG MONITORING, ED: Glucose-Capillary: 114 mg/dL — ABNORMAL HIGH (ref 65–99)

## 2016-12-06 LAB — I-STAT BETA HCG BLOOD, ED (MC, WL, AP ONLY): I-stat hCG, quantitative: <5 m[IU]/mL (ref <5)

## 2016-12-06 MED ORDER — SODIUM CHLORIDE 0.9 % IV BOLUS (SEPSIS)
1000.0000 mL | Freq: Once | INTRAVENOUS | Status: AC
  Administered 2016-12-06: 1000 mL via INTRAVENOUS

## 2016-12-06 NOTE — ED Provider Notes (Signed)
[facility] DEPT Provider Note   CSN: [id number] Arrival date & time: 12/06/16  0234   By signing my name below, I, [name], attest that this documentation has been prepared under the direction and in the presence of Ripley Fraise, MD  Electronically Signed: [name], ED Scribe. 12/06/16. 3:05 AM.   History   Chief Complaint Chief Complaint  Patient presents with  . Near Syncope    The history is provided by the patient. No language interpreter was used.  Near Syncope  This is a new problem. The current episode started less than 1 hour ago. The problem has been gradually improving. Associated symptoms include abdominal pain. Pertinent negatives include no chest pain and no shortness of breath. Nothing aggravates the symptoms. Nothing relieves the symptoms. She has tried nothing for the symptoms.   HPI Comments:  Victoria Harvey is a 36 y.o. female, with no significant PMHx, who presents to the Emergency Department complaining of sudden onset, near syncopal event x several minutes ago. Pt states she suddenly began sweating and felt lightheaded prior this near syncopal event. She notes her child is a sick contact and states she has not been sleeping well due to tending to her sick child and working. She also notes mild abdominal pain and a heavy period which lasted for about 1.5 weeks. No alleviating factors noted. Pt denies cough, nausea, vomiting, diarrhea ,chest pain, SOB, frequency, dysuria, daily medication use, hx of heart problems, hx of blood clots, any other associated symptoms and any other modifying factors at this time.    Past Medical History:  Diagnosis Date  . Pinched nerve    in back; recently completed course of meds for sxs  . Pneumonia   . Strep sore throat     Patient Active Problem List   Diagnosis Date Noted  . NVD (normal vaginal delivery) 08/20/2015  . Labor and delivery, indication for care 08/19/2015  . Significant discrepancy between uterine  size and clinical dates, antepartum   . [redacted] weeks gestation of pregnancy   . Pneumonia 04/26/2015  . Pneumonia affecting pregnancy in second trimester 04/26/2015  . Abdominal pain affecting pregnancy 01/25/2015    Past Surgical History:  Procedure Laterality Date  . NO PAST SURGERIES    . WISDOM TOOTH EXTRACTION      OB History    Gravida Para Term Preterm AB Living   2 2 2  0 0 2   SAB TAB Ectopic Multiple Live Births   0 0 0 0 2       Home Medications    Prior to Admission medications   Medication Sig Start Date End Date Taking? Authorizing Provider  albuterol (PROVENTIL HFA;VENTOLIN HFA) 108 (90 Base) MCG/ACT inhaler Inhale 2 puffs into the lungs every 4 (four) hours as needed for wheezing or shortness of breath (cough). 04/22/16   Mercedes Camprubi-Soms, PA-C  azithromycin (ZITHROMAX) 250 MG tablet Take 2 today then 1 days 2-5 04/26/16   Argentina Donovan, PA-C  diclofenac (VOLTAREN) 50 MG EC tablet Take 1 tablet (50 mg total) by mouth 2 (two) times daily. 10/07/16   Hope Bunnie Pion, NP  hydrocortisone-pramoxine [facility]) rectal foam Place 1 applicator rectally 2 (two) times daily. Patient not taking: Reported on 04/22/2016 08/30/15   Paticia Stack, PA-C  hydrOXYzine (ATARAX/VISTARIL) 10 MG tablet Take 1 tablet (10 mg total) by mouth 3 (three) times daily as needed for itching. 06/03/16   Nicole Pisciotta, PA-C  ibuprofen (ADVIL,MOTRIN) 200 MG tablet  Take 200-400 mg by mouth every 6 (six) hours as needed for headache or moderate pain.    Historical Provider, MD  ibuprofen (ADVIL,MOTRIN) 800 MG tablet Take 1 tablet (800 mg total) by mouth 3 (three) times daily. 11/03/15   Larene Pickett, PA-C  penicillin v potassium (VEETID) 500 MG tablet Take 1 tablet (500 mg total) by mouth 4 (four) times daily. 06/25/16   Montine Circle, PA-C  permethrin (ELIMITE) 5 % cream Apply to entire body other than face - let sit for 14 hours then wash off, may repeat in 1 week if still having  symptoms Do not breastfeed while using treatment 06/25/16   Montine Circle, PA-C  predniSONE (DELTASONE) 20 MG tablet Take 1 tablet (20 mg total) by mouth daily. 05/20/16   Gloriann Loan, PA-C  triamcinolone cream (KENALOG) 0.1 % Apply two times daily to affected areas on your arms and legs.  Do not apply to face. 05/20/16   Gloriann Loan, PA-C    Family History Family History  Problem Relation Age of Onset  . Hypertension Mother   . Hypertension Father   . Cancer Maternal Grandmother     breast  . Stroke Paternal Grandmother     Social History Social History  Substance Use Topics  . Smoking status: Former Smoker    Packs/day: 0.00    Types: Cigarettes    Quit date: 11/24/2014  . Smokeless tobacco: Never Used  . Alcohol use No     Allergies   Levofloxacin   Review of Systems Review of Systems  Constitutional: Positive for diaphoresis.  Respiratory: Negative for cough and shortness of breath.   Cardiovascular: Positive for near-syncope. Negative for chest pain.  Gastrointestinal: Positive for abdominal pain. Negative for diarrhea, nausea and vomiting.  Genitourinary: Negative for dysuria and frequency.  Neurological: Positive for light-headedness.  All other systems reviewed and are negative.  Physical Exam Updated Vital Signs BP 118/57   Pulse 87   Temp 97.6 F (36.4 C) (Oral)   Resp 13   SpO2 100%   Physical Exam CONSTITUTIONAL: Well developed/well nourished HEAD: Normocephalic/atraumatic EYES: EOMI/PERRL ENMT: Mucous membranes moist NECK: supple no meningeal signs SPINE/BACK:entire spine nontender CV: S1/S2 noted, no murmurs/rubs/gallops noted LUNGS: Lungs are clear to auscultation bilaterally, no apparent distress ABDOMEN: soft, nontender, no rebound or guarding, bowel sounds noted throughout abdomen GU:no cva tenderness NEURO: Pt is awake/alert/appropriate, moves all extremitiesx4.  No facial droop. No arm or leg drift EXTREMITIES: pulses normal/equal, full  ROM SKIN: warm, color normal PSYCH: no abnormalities of mood noted, alert and oriented to situation  ED Treatments / Results  DIAGNOSTIC STUDIES:  Oxygen Saturation is 100% on RA, norma by my interpretation.    COORDINATION OF CARE:  3:00 AM Discussed treatment plan with pt at bedside and pt agreed to plan.  Labs (all labs ordered are listed, but only abnormal results are displayed) Labs Reviewed  BASIC METABOLIC PANEL - Abnormal; Notable for the following:       Result Value   Potassium 3.2 (*)    Glucose, Bld 146 (*)    All other components within normal limits  CBG MONITORING, ED - Abnormal; Notable for the following:    Glucose-Capillary 114 (*)    All other components within normal limits  CBC  I-STAT BETA HCG BLOOD, ED ([facility], [facility], [facility] ONLY)    EKG  EKG Interpretation  Date/Time:  Friday December 06 2016 02:55:18 EST Ventricular Rate:  86 PR Interval:    QRS  Duration: 97 QT Interval:  394 QTC Calculation: 472 R Axis:   68 Text Interpretation:  Sinus rhythm Interpretation limited secondary to artifact Otherwise no significant change Confirmed by Christy Gentles  MD, [place] ([id number]) on 12/06/2016 3:06:01 AM       Radiology No results found.  Procedures Procedures (including critical care time)  Medications Ordered in ED Medications  sodium chloride 0.9 % bolus 1,000 mL (0 mLs Intravenous Stopped 12/06/16 0350)     Initial Impression / Assessment and Plan / ED Course  I have reviewed the triage vital signs and the nursing notes.  Pertinent labs  results that were available during my care of the patient were reviewed by me and considered in my medical decision making (see chart for details).  Clinical Course     Pt improved No distress Ambulatory Will d/c  She was in the peds ED with her daughter,  She will go there to check on her child BP 129/88   Pulse 97   Temp 97.6 F (36.4 C) (Oral)   Resp 19   SpO2 100%    Final Clinical Impressions(s) / ED Diagnoses    Final diagnoses:  Near syncope    New Prescriptions New Prescriptions   No medications on file  I personally performed the services described in this documentation, which was scribed in my presence. The recorded information has been reviewed and is accurate.        Ripley Fraise, MD 12/06/16 0400

## 2016-12-06 NOTE — ED Notes (Signed)
MD at bedside. 

## 2016-12-06 NOTE — ED Triage Notes (Addendum)
Pt was sitting with daughter in peds. All of a sudden, broke out in a sweat and felt like she was going to faint. Pt a&o x4. C/o upper abd pain. Pt hypotensive and clammy in triage

## 2016-12-06 NOTE — ED Notes (Signed)
CBG resulted: 114. RN notified.

## 2017-05-24 ENCOUNTER — Encounter (HOSPITAL_COMMUNITY): Payer: Self-pay | Admitting: *Deleted

## 2017-05-24 ENCOUNTER — Emergency Department (HOSPITAL_COMMUNITY)
Admission: EM | Admit: 2017-05-24 | Discharge: 2017-05-24 | Disposition: A | Discharge status: Home / Self Care | Payer: Medicaid Other | Attending: Emergency Medicine | Admitting: Emergency Medicine

## 2017-05-24 DIAGNOSIS — Z87891 Personal history of nicotine dependence: Secondary | ICD-10-CM | POA: Insufficient documentation

## 2017-05-24 DIAGNOSIS — R110 Nausea: Secondary | ICD-10-CM | POA: Insufficient documentation

## 2017-05-24 DIAGNOSIS — K047 Periapical abscess without sinus: Secondary | ICD-10-CM | POA: Insufficient documentation

## 2017-05-24 MED ORDER — AMOXICILLIN 500 MG PO CAPS
500.0000 mg | ORAL_CAPSULE | Freq: Once | ORAL | Status: AC
  Administered 2017-05-24: 500 mg via ORAL
  Filled 2017-05-24: qty 1

## 2017-05-24 MED ORDER — AMOXICILLIN 500 MG PO CAPS: 500.0000 mg | ORAL_CAPSULE | Freq: Three times a day (TID) | ORAL | 0 refills | Status: DC

## 2017-05-24 NOTE — ED Triage Notes (Signed)
The pt is c/o a toothache since yesterday and since she has been taking excedrin and ibuprofen  And now she has abd an chest pain from the medicine  lmp irregular

## 2017-05-24 NOTE — Discharge Instructions (Signed)
Call Dr. Windy Fast office on Monday and tell them you were evaluated here and need follow up.

## 2017-05-24 NOTE — ED Provider Notes (Signed)
[facility] DEPT Provider Note   CSN: [id number] Arrival date & time: 05/24/17  1559  By signing my name below, I, Jeanell Sparrow, attest that this documentation has been prepared under the direction and in the presence of non-physician practitioner, Debroah Baller, NP. Electronically Signed: Jeanell Sparrow, Scribe. 05/24/2017. 4:56 PM.  History   Chief Complaint Chief Complaint  Patient presents with  . Dental Problem   The history is provided by the patient. No language interpreter was used.  Dental Pain   This is a new problem. The current episode started yesterday. The problem occurs constantly. The problem has not changed since onset.The pain is moderate. She has tried acetaminophen for the symptoms. The treatment provided mild relief.   HPI Comments: Victoria Harvey is a 36 y.o. female who presents to the Emergency Department complaining of constant moderate left upper dental pain that started yesterday. She has had similar pain in the past. She has been taking Tylenol, Excedrin, and ibuprofen with minimal relief. She reports associated left ear pain and nausea. She admits to a prior hx of similar pain. Denies any sore throat, cough, congestion, abdominal pain, vomiting, or other complaints at this time.  Past Medical History:  Diagnosis Date  . Pinched nerve    in back; recently completed course of meds for sxs  . Pneumonia   . Strep sore throat     Patient Active Problem List   Diagnosis Date Noted  . NVD (normal vaginal delivery) 08/20/2015  . Labor and delivery, indication for care 08/19/2015  . Significant discrepancy between uterine size and clinical dates, antepartum   . [redacted] weeks gestation of pregnancy   . Pneumonia 04/26/2015  . Pneumonia affecting pregnancy in second trimester 04/26/2015  . Abdominal pain affecting pregnancy 01/25/2015    Past Surgical History:  Procedure Laterality Date  . NO PAST SURGERIES    . WISDOM TOOTH EXTRACTION      OB History    Gravida Para Term Preterm AB Living   2 2 2  0 0 2   SAB TAB Ectopic Multiple Live Births   0 0 0 0 2       Home Medications    Prior to Admission medications   Medication Sig Start Date End Date Taking? Authorizing Provider  albuterol (PROVENTIL HFA;VENTOLIN HFA) 108 (90 Base) MCG/ACT inhaler Inhale 2 puffs into the lungs every 4 (four) hours as needed for wheezing or shortness of breath (cough). 04/22/16   Street, [place], PA-C  amoxicillin (AMOXIL) 500 MG capsule Take 1 capsule (500 mg total) by mouth 3 (three) times daily. 05/24/17   Ashley Murrain, NP  hydrOXYzine (ATARAX/VISTARIL) 10 MG tablet Take 1 tablet (10 mg total) by mouth 3 (three) times daily as needed for itching. 06/03/16   Pisciotta, Elmyra Ricks, PA-C  ibuprofen (ADVIL,MOTRIN) 200 MG tablet Take 200-400 mg by mouth every 6 (six) hours as needed for headache or moderate pain.    [provider]  Prenatal Vit-Fe Fumarate-FA (PRENATAL MULTIVITAMIN) TABS tablet Take 1 tablet by mouth daily at 12 noon.    [provider]    Family History Family History  Problem Relation Age of Onset  . Hypertension Mother   . Hypertension Father   . Cancer Maternal Grandmother        breast  . Stroke Paternal Grandmother     Social History Social History  Substance Use Topics  . Smoking status: Former Smoker    Packs/day: 0.00    Types: Cigarettes  Quit date: 11/24/2014  . Smokeless tobacco: Never Used  . Alcohol use No     Allergies   Levofloxacin   Review of Systems Review of Systems  Constitutional: Negative for fever.  HENT: Positive for dental problem (left upper) and ear pain (left). Negative for congestion and sore throat.   Respiratory: Negative for cough.   Gastrointestinal: Positive for nausea. Negative for abdominal pain and vomiting.     Physical Exam Updated Vital Signs BP (!) 146/89 (BP Location: Left Arm)   Pulse 75   Temp 98.4 F (36.9 C) (Oral)   Resp 18   Ht 5\' 7"  (1.702 m)   Wt  160 lb (72.6 kg)   SpO2 97%   BMI 25.06 kg/m   Physical Exam  Constitutional: She appears well-developed and well-nourished. No distress.  HENT:  Head: Normocephalic.  Right Ear: Tympanic membrane and ear canal normal.  Left Ear: Tympanic membrane and ear canal normal.  Mouth/Throat: Uvula is midline, oropharynx is clear and moist and mucous membranes are normal.  Left upper 1st molar is decayed and broken. Swelling to surrounding gums with tenderness.   Eyes: Conjunctivae are normal.  Neck: Neck supple.  Cardiovascular: Normal rate and regular rhythm.   Pulmonary/Chest: Effort normal and breath sounds normal.  Abdominal: Soft.  Musculoskeletal: Normal range of motion.  Lymphadenopathy:    She has no cervical adenopathy.  Neurological: She is alert.  Skin: Skin is warm and dry.  Psychiatric: She has a normal mood and affect. Her behavior is normal.  Nursing note and vitals reviewed.    ED Treatments / Results  DIAGNOSTIC STUDIES: Oxygen Saturation is 97% on RA, normal by my interpretation.    COORDINATION OF CARE: 5:00 PM- Pt advised of plan for treatment and pt agrees. Patient request dental block.  Radiology No results found.  Procedures Dental Block Date/Time: 05/24/2017 5:35 PM Performed by: Ashley Murrain Authorized by: Ashley Murrain   Consent:    Consent obtained:  Verbal   Consent given by:  Patient   Risks discussed:  Pain, swelling and unsuccessful block   Alternatives discussed:  No treatment and delayed treatment Indications:    Indications: dental pain   Location:    Block type:  Supraperiosteal   Supraperiosteal location:  Upper teeth   Upper teeth location:  14/LU 1st molar Procedure details (see MAR for exact dosages):    Topical anesthetic:  Lidocaine gel   Syringe type:  Controlled syringe   Needle gauge:  30 G   Anesthetic injected:  Lidocaine 2% w/o epi Post-procedure details:    Outcome:  Pain relieved   Patient tolerance of procedure:   Tolerated well, no immediate complications    (including critical care time)  Medications Ordered in ED Medications  amoxicillin (AMOXIL) capsule 500 mg (500 mg Oral Given 05/24/17 1749)     Initial Impression / Assessment and Plan / ED Course  I have reviewed the triage vital signs and the nursing notes. Patient with toothache.  No gross abscess.  Exam unconcerning for Ludwig's angina or spread of infection.  Will treat with amoxicillin and ibuprofen. Urged patient to follow-up with Dr. Orene Desanctis and to schedule an appointment in 2 days.   Final Clinical Impressions(s) / ED Diagnoses   Final diagnoses:  Dental infection    New Prescriptions Discharge Medication List as of 05/24/2017  5:43 PM    START taking these medications   Details  amoxicillin (AMOXIL) 500 MG capsule Take 1 capsule (  500 mg total) by mouth 3 (three) times daily., Starting Sat 05/24/2017, Print      I personally performed the services described in this documentation, which was scribed in my presence. The recorded information has been reviewed and is accurate.    Debroah Baller [place], [place] 05/25/17 9324    Duffy Bruce, MD 05/25/17 [phone]

## 2017-07-31 ENCOUNTER — Emergency Department (HOSPITAL_COMMUNITY)
Admission: EM | Admit: 2017-07-31 | Discharge: 2017-07-31 | Disposition: A | Discharge status: Home / Self Care | Payer: No Typology Code available for payment source | Attending: Emergency Medicine | Admitting: Emergency Medicine

## 2017-07-31 ENCOUNTER — Encounter (HOSPITAL_COMMUNITY): Payer: Self-pay | Admitting: Emergency Medicine

## 2017-07-31 DIAGNOSIS — J40 Bronchitis, not specified as acute or chronic: Secondary | ICD-10-CM

## 2017-07-31 DIAGNOSIS — Z87891 Personal history of nicotine dependence: Secondary | ICD-10-CM | POA: Diagnosis not present

## 2017-07-31 DIAGNOSIS — R05 Cough: Secondary | ICD-10-CM | POA: Diagnosis present

## 2017-07-31 DIAGNOSIS — J029 Acute pharyngitis, unspecified: Secondary | ICD-10-CM | POA: Insufficient documentation

## 2017-07-31 LAB — RAPID STREP SCREEN (MED CTR MEBANE ONLY): Streptococcus, Group A Screen (Direct): NEGATIVE

## 2017-07-31 MED ORDER — AZITHROMYCIN 250 MG PO TABS: ORAL_TABLET | ORAL | 0 refills | Status: DC

## 2017-07-31 MED ORDER — ALBUTEROL SULFATE HFA 108 (90 BASE) MCG/ACT IN AERS: 2.0000 | INHALATION_SPRAY | RESPIRATORY_TRACT | 0 refills | Status: DC | PRN

## 2017-07-31 MED ORDER — BENZONATATE 100 MG PO CAPS: 100.0000 mg | ORAL_CAPSULE | Freq: Three times a day (TID) | ORAL | 0 refills | Status: DC

## 2017-07-31 NOTE — ED Triage Notes (Signed)
Patient reports that she had sore throat x couple days with cough with little yellow phlegm and chest pain with inhalation.

## 2017-07-31 NOTE — ED Provider Notes (Signed)
[facility] DEPT Provider Note   CSN: [id number] Arrival date & time: 07/31/17  1729     History   Chief Complaint Chief Complaint  Patient presents with  . Sore Throat  . Cough    HPI Victoria Harvey is a 36 y.o. female with hx of pneumonia and bronchitis who presents to the ED with sore throat and cough that started 3 days ago. She denies fever but has had chills.  The history is provided by the patient. No language interpreter was used.  Sore Throat  This is a new problem. The current episode started more than 2 days ago. The problem occurs constantly. The problem has been gradually worsening. Associated symptoms include chest pain (with cough) and headaches. Pertinent negatives include no abdominal pain. She has tried acetaminophen for the symptoms.  Cough  This is a new problem. The current episode started more than 2 days ago. The cough is productive of sputum. There has been no fever. Associated symptoms include chest pain (with cough), chills, headaches and sore throat. Pertinent negatives include no ear pain, no myalgias, no wheezing and no eye redness. She is not a smoker. Her past medical history is significant for pneumonia.    Past Medical History:  Diagnosis Date  . Pinched nerve    in back; recently completed course of meds for sxs  . Pneumonia   . Strep sore throat     Patient Active Problem List   Diagnosis Date Noted  . NVD (normal vaginal delivery) 08/20/2015  . Labor and delivery, indication for care 08/19/2015  . Significant discrepancy between uterine size and clinical dates, antepartum   . [redacted] weeks gestation of pregnancy   . Pneumonia 04/26/2015  . Pneumonia affecting pregnancy in second trimester 04/26/2015  . Abdominal pain affecting pregnancy 01/25/2015    Past Surgical History:  Procedure Laterality Date  . NO PAST SURGERIES    . WISDOM TOOTH EXTRACTION      OB History    Gravida Para Term Preterm AB Living   2 2 2  0 0 2   SAB TAB  Ectopic Multiple Live Births   0 0 0 0 2       Home Medications    Prior to Admission medications   Medication Sig Start Date End Date Taking? Authorizing Provider  albuterol (PROVENTIL HFA;VENTOLIN HFA) 108 (90 Base) MCG/ACT inhaler Inhale 2 puffs into the lungs every 4 (four) hours as needed for wheezing or shortness of breath. 07/31/17   Ashley Murrain, NP  azithromycin (ZITHROMAX Z-PAK) 250 MG tablet Take 2 tablets today and then one tablet PO daily until finished 07/31/17   Debroah Baller M, NP  benzonatate (TESSALON) 100 MG capsule Take 1 capsule (100 mg total) by mouth every 8 (eight) hours. 07/31/17   Ashley Murrain, NP  hydrOXYzine (ATARAX/VISTARIL) 10 MG tablet Take 1 tablet (10 mg total) by mouth 3 (three) times daily as needed for itching. 06/03/16   Pisciotta, Elmyra Ricks, PA-C  ibuprofen (ADVIL,MOTRIN) 200 MG tablet Take 200-400 mg by mouth every 6 (six) hours as needed for headache or moderate pain.    [provider]  Prenatal Vit-Fe Fumarate-FA (PRENATAL MULTIVITAMIN) TABS tablet Take 1 tablet by mouth daily at 12 noon.    [provider]    Family History Family History  Problem Relation Age of Onset  . Hypertension Mother   . Hypertension Father   . Cancer Maternal Grandmother        breast  .  Stroke Paternal Grandmother     Social History Social History  Substance Use Topics  . Smoking status: Former Smoker    Packs/day: 0.00    Types: Cigarettes    Quit date: 11/24/2014  . Smokeless tobacco: Never Used  . Alcohol use No     Allergies   Levofloxacin   Review of Systems Review of Systems  Constitutional: Positive for chills.  HENT: Positive for sinus pressure and sore throat. Negative for congestion, ear pain and sinus pain.   Eyes: Negative for pain, discharge, redness and visual disturbance.  Respiratory: Positive for cough. Negative for wheezing.   Cardiovascular: Positive for chest pain (with cough).  Gastrointestinal: Negative for  abdominal pain, nausea and vomiting.  Genitourinary: Negative for dysuria, frequency, urgency, vaginal bleeding and vaginal discharge.  Musculoskeletal: Positive for back pain. Negative for myalgias.  Skin: Negative for rash and wound.  Neurological: Positive for headaches. Negative for syncope.  Psychiatric/Behavioral: Negative for confusion. The patient is not nervous/anxious.      Physical Exam Updated Vital Signs BP (!) 151/88 (BP Location: Left Arm)   Pulse (!) 102   Temp 98.3 F (36.8 C) (Oral)   Resp 20   Ht 5\' 8"  (1.727 m)   Wt 72.6 kg (160 lb)   LMP 07/29/2017   SpO2 97%   BMI 24.33 kg/m   Physical Exam  Constitutional: She is oriented to person, place, and time. She appears well-developed and well-nourished. No distress.  HENT:  Head: Normocephalic and atraumatic.  Right Ear: Tympanic membrane normal.  Left Ear: Tympanic membrane normal.  Nose: Rhinorrhea present.  Mouth/Throat: Uvula is midline and mucous membranes are normal. Posterior oropharyngeal erythema present.  Eyes: EOM are normal.  Neck: Normal range of motion. Neck supple.  Cardiovascular: Normal rate and regular rhythm.   Pulmonary/Chest: Effort normal. Wheezes: occasional. She has no rales.  Abdominal: Soft. There is no tenderness.  Musculoskeletal: Normal range of motion.  Lymphadenopathy:    She has no cervical adenopathy.  Neurological: She is alert and oriented to person, place, and time. No cranial nerve deficit.  Skin: Skin is warm and dry.  Psychiatric: She has a normal mood and affect. Her behavior is normal.  Nursing note and vitals reviewed.    ED Treatments / Results  Labs (all labs ordered are listed, but only abnormal results are displayed) Labs Reviewed  RAPID STREP SCREEN (NOT AT [facility])  CULTURE, GROUP A STREP [facility])   Radiology No results found.  Procedures Procedures (including critical care time)  Medications Ordered in ED Medications - No data to  display   Initial Impression / Assessment and Plan / ED Course  I have reviewed the triage vital signs and the nursing notes.  36 y.o. female with cough and sore throat stable for d/c without fever, she has a negative strep screen. Will treat for bronchitis and she will f/u with her PCP or return here for worsening symptoms. Discussed option of cxr tonight with her PMH of pneumonia but patient declined and states if her symptoms worsen she will return.   Final Clinical Impressions(s) / ED Diagnoses   Final diagnoses:  Bronchitis  Sore throat    New Prescriptions New Prescriptions   ALBUTEROL (PROVENTIL HFA;VENTOLIN HFA) 108 (90 BASE) MCG/ACT INHALER    Inhale 2 puffs into the lungs every 4 (four) hours as needed for wheezing or shortness of breath.   AZITHROMYCIN (ZITHROMAX Z-PAK) 250 MG TABLET    Take 2 tablets today and then  one tablet PO daily until finished   BENZONATATE (TESSALON) 100 MG CAPSULE    Take 1 capsule (100 mg total) by mouth every 8 (eight) hours.     Debroah Baller [place], NP 07/31/17 2052    Debroah Baller [place], NP 07/31/17 2109    Duffy Bruce, MD 08/01/17 [phone]

## 2017-08-04 ENCOUNTER — Emergency Department (HOSPITAL_COMMUNITY)
Admission: EM | Admit: 2017-08-04 | Discharge: 2017-08-04 | Disposition: A | Discharge status: Home / Self Care | Payer: No Typology Code available for payment source | Attending: Emergency Medicine | Admitting: Emergency Medicine

## 2017-08-04 ENCOUNTER — Emergency Department (HOSPITAL_COMMUNITY): Payer: No Typology Code available for payment source

## 2017-08-04 ENCOUNTER — Encounter (HOSPITAL_COMMUNITY): Payer: Self-pay | Admitting: *Deleted

## 2017-08-04 DIAGNOSIS — J40 Bronchitis, not specified as acute or chronic: Secondary | ICD-10-CM | POA: Diagnosis not present

## 2017-08-04 DIAGNOSIS — Z79899 Other long term (current) drug therapy: Secondary | ICD-10-CM | POA: Diagnosis not present

## 2017-08-04 DIAGNOSIS — R05 Cough: Secondary | ICD-10-CM | POA: Diagnosis present

## 2017-08-04 DIAGNOSIS — Z87891 Personal history of nicotine dependence: Secondary | ICD-10-CM | POA: Diagnosis not present

## 2017-08-04 LAB — CULTURE, GROUP A STREP (THRC)

## 2017-08-04 MED ORDER — HYDROCODONE-HOMATROPINE 5-1.5 MG/5ML PO SYRP: 5.0000 mL | ORAL_SOLUTION | Freq: Four times a day (QID) | ORAL | 0 refills | Status: DC | PRN

## 2017-08-04 NOTE — ED Notes (Signed)
Patient taken to XRAY

## 2017-08-04 NOTE — Discharge Instructions (Signed)
Please read instructions below.  You can take 50mLs of the cough syrup every 6 hours for cough. Do not drive or drink alcohol while taking this medication. You can take tylenol or advil as needed for sore throat or body aches.  Drink plenty of water. Blow your nose frequently to avoid postnasal drip, as this is contributing to your productive cough. Follow up with your primary care provider as needed.  Return to the ER for difficulty swallowing liquids, difficulty breathing, or new or worsening symptoms.

## 2017-08-04 NOTE — ED Triage Notes (Signed)
Pt c/o productive cough with yellow sputum, pt afebrile in triage, pt reports compliance with z pac, pt reports generalized body aches, & mid chest pain with cough, A&O x4

## 2017-08-04 NOTE — ED Provider Notes (Signed)
[facility] DEPT Provider Note   CSN: [id number] Arrival date & time: 08/04/17  1709     History   Chief Complaint Chief Complaint  Patient presents with  . Cough    HPI Victoria Harvey is a 36 y.o. female past medical history of pneumonia, presented to the ED with persistent worsening cough that began about 5 days ago. Patient was seen in the ED on 07/31/2017 this complaint. At that time she declined chest x-ray. Patient was diagnosed with bronchitis and discharged with Z-Pak. Patient states she has been taking medication as prescribed, with 1 dose of azithromycin remaining. States cough is persistent, and intermittently productive with yellow sputum. She states she has been taking Advil for body aches, with relief. Reports associated runny nose and sore throat. Denies fever, difficulty swallowing or breathing, abdominal pain, ear pain, or other symptoms today.  The history is provided by the patient.    Past Medical History:  Diagnosis Date  . Pinched nerve    in back; recently completed course of meds for sxs  . Pneumonia   . Strep sore throat     Patient Active Problem List   Diagnosis Date Noted  . NVD (normal vaginal delivery) 08/20/2015  . Labor and delivery, indication for care 08/19/2015  . Significant discrepancy between uterine size and clinical dates, antepartum   . [redacted] weeks gestation of pregnancy   . Pneumonia 04/26/2015  . Pneumonia affecting pregnancy in second trimester 04/26/2015  . Abdominal pain affecting pregnancy 01/25/2015    Past Surgical History:  Procedure Laterality Date  . NO PAST SURGERIES    . WISDOM TOOTH EXTRACTION      OB History    Gravida Para Term Preterm AB Living   2 2 2  0 0 2   SAB TAB Ectopic Multiple Live Births   0 0 0 0 2       Home Medications    Prior to Admission medications   Medication Sig Start Date End Date Taking? Authorizing Provider  albuterol (PROVENTIL HFA;VENTOLIN HFA) 108 (90 Base) MCG/ACT inhaler  Inhale 2 puffs into the lungs every 4 (four) hours as needed for wheezing or shortness of breath. 07/31/17   Ashley Murrain, NP  azithromycin (ZITHROMAX Z-PAK) 250 MG tablet Take 2 tablets today and then one tablet PO daily until finished 07/31/17   Debroah Baller M, NP  benzonatate (TESSALON) 100 MG capsule Take 1 capsule (100 mg total) by mouth every 8 (eight) hours. 07/31/17   Ashley Murrain, NP  HYDROcodone-homatropine [facility]) 5-1.5 MG/5ML syrup Take 5 mLs by mouth every 6 (six) hours as needed for cough. 08/04/17   Russo, [place] N, PA-C  hydrOXYzine (ATARAX/VISTARIL) 10 MG tablet Take 1 tablet (10 mg total) by mouth 3 (three) times daily as needed for itching. 06/03/16   Pisciotta, Elmyra Ricks, PA-C  ibuprofen (ADVIL,MOTRIN) 200 MG tablet Take 200-400 mg by mouth every 6 (six) hours as needed for headache or moderate pain.    [provider]  Prenatal Vit-Fe Fumarate-FA (PRENATAL MULTIVITAMIN) TABS tablet Take 1 tablet by mouth daily at 12 noon.    [provider]    Family History Family History  Problem Relation Age of Onset  . Hypertension Mother   . Hypertension Father   . Cancer Maternal Grandmother        breast  . Stroke Paternal Grandmother     Social History Social History  Substance Use Topics  . Smoking status: Former Smoker  Packs/day: 0.00    Types: Cigarettes    Quit date: 11/24/2014  . Smokeless tobacco: Never Used  . Alcohol use No     Allergies   Levofloxacin   Review of Systems Review of Systems  Constitutional: Negative for chills and fever.  HENT: Positive for rhinorrhea and sore throat. Negative for congestion, ear pain, trouble swallowing and voice change.   Respiratory: Positive for cough. Negative for shortness of breath.   Gastrointestinal: Negative for abdominal pain, nausea and vomiting.  Musculoskeletal: Positive for myalgias.  Skin: Negative for color change.     Physical Exam Updated Vital Signs BP (!) 146/95 (BP Location: Left  Arm)   Pulse 81   Temp 97.9 F (36.6 C) (Oral)   Resp 16   Ht 5\' 8"  (1.727 m)   Wt 72.6 kg (160 lb)   LMP 08/01/2017 (Approximate)   SpO2 100%   Breastfeeding? No   BMI 24.33 kg/m   Physical Exam  Constitutional: She appears well-developed and well-nourished. No distress.  Well-appearing, tolerating secretions.  HENT:  Head: Normocephalic and atraumatic.  Right Ear: Hearing, external ear and ear canal normal. Tympanic membrane is erythematous. Tympanic membrane is not bulging. No middle ear effusion.  Left Ear: Hearing, tympanic membrane, external ear and ear canal normal. Tympanic membrane is not bulging.  No middle ear effusion.  Nose: Nose normal.  Mouth/Throat: Uvula is midline. No trismus in the jaw. No uvula swelling. Posterior oropharyngeal erythema present. No tonsillar exudate.  Eyes: Conjunctivae are normal.  Neck: Normal range of motion. Neck supple. No tracheal deviation present.  Cardiovascular: Normal rate, regular rhythm, normal heart sounds and intact distal pulses.  Exam reveals no friction rub.   No murmur heard. Pulmonary/Chest: Effort normal and breath sounds normal. No stridor. No respiratory distress. She has no decreased breath sounds. She has no wheezes. She has no rhonchi. She has no rales.  Abdominal: Soft. Bowel sounds are normal.  Lymphadenopathy:    She has no cervical adenopathy.  Psychiatric: She has a normal mood and affect. Her behavior is normal.  Nursing note and vitals reviewed.    ED Treatments / Results  Labs (all labs ordered are listed, but only abnormal results are displayed) Labs Reviewed - No data to display  EKG  EKG Interpretation None       Radiology Dg Chest 2 View  Result Date: 08/04/2017 CLINICAL DATA:  Productive cough EXAM: CHEST  2 VIEW COMPARISON:  04/22/2006 FINDINGS: The heart size and mediastinal contours are within normal limits. Both lungs are clear. The visualized skeletal structures are unremarkable.  IMPRESSION: No active cardiopulmonary disease. Electronically Signed   By: Donavan Foil M.D.   On: 08/04/2017 18:15    Procedures Procedures (including critical care time)  Medications Ordered in ED Medications - No data to display   Initial Impression / Assessment and Plan / ED Course  I have reviewed the triage vital signs and the nursing notes.  Pertinent labs & imaging results that were available during my care of the patient were reviewed by me and considered in my medical decision making (see chart for details).     Pt presenting for subsequent visit with URI symptoms and persistent cough, likely viral. On exam, lungs are clear to auscultation bilaterally, mild oropharyngeal erythema without exudates or adenopathy. No increased work of breathing. Patient is well-appearing, tolerating secretions, and O2 saturation 100% on room air. CXR negative for PNA.  Will discharge with symptomatic management. Advised patient to follow  up with PCP. She is safe for discharge home.  Discussed results, findings, treatment and follow up. Patient advised of return precautions. Patient verbalized understanding and agreed with plan.   Final Clinical Impressions(s) / ED Diagnoses   Final diagnoses:  Bronchitis    New Prescriptions Discharge Medication List as of 08/04/2017  6:48 PM    START taking these medications   Details  HYDROcodone-homatropine (HYCODAN) 5-1.5 MG/5ML syrup Take 5 mLs by mouth every 6 (six) hours as needed for cough., Starting Mon 08/04/2017, Print         Virgina Jock, [place] N, PA-C 08/04/17 Jeananne Rama    Pattricia Boss, MD 08/04/17 [phone]

## 2017-08-04 NOTE — ED Notes (Signed)
Patient Alert and oriented X4. Stable and ambulatory. Patient verbalized understanding of the discharge instructions.  Patient belongings were taken by the patient.  

## 2017-11-14 ENCOUNTER — Encounter (HOSPITAL_COMMUNITY): Payer: Self-pay

## 2017-11-14 ENCOUNTER — Emergency Department (HOSPITAL_COMMUNITY)
Admission: EM | Admit: 2017-11-14 | Discharge: 2017-11-14 | Disposition: A | Discharge status: Home / Self Care | Payer: Self-pay | Attending: Emergency Medicine | Admitting: Emergency Medicine

## 2017-11-14 ENCOUNTER — Emergency Department (HOSPITAL_COMMUNITY): Payer: Self-pay

## 2017-11-14 ENCOUNTER — Other Ambulatory Visit: Payer: Self-pay

## 2017-11-14 DIAGNOSIS — R21 Rash and other nonspecific skin eruption: Secondary | ICD-10-CM | POA: Insufficient documentation

## 2017-11-14 DIAGNOSIS — Z87891 Personal history of nicotine dependence: Secondary | ICD-10-CM | POA: Insufficient documentation

## 2017-11-14 DIAGNOSIS — I10 Essential (primary) hypertension: Secondary | ICD-10-CM | POA: Insufficient documentation

## 2017-11-14 HISTORY — DX: Essential (primary) hypertension: I10

## 2017-11-14 MED ORDER — CEPHALEXIN 500 MG PO CAPS: 500.0000 mg | ORAL_CAPSULE | Freq: Four times a day (QID) | ORAL | 0 refills | Status: DC

## 2017-11-14 MED ORDER — TERBINAFINE HCL 1 % EX CREA: 1.0000 "application " | TOPICAL_CREAM | Freq: Two times a day (BID) | CUTANEOUS | 0 refills | Status: DC

## 2017-11-14 MED ORDER — TOLNAFTATE 1 % EX POWD: 1.0000 "application " | Freq: Two times a day (BID) | CUTANEOUS | 0 refills | Status: DC

## 2017-11-14 NOTE — ED Triage Notes (Signed)
Per Pt, pt is coming from work with complaints of rash on her feet bilaterally x 1 year and blisters that started three days ago. Works at [employer] and is on her feet often. Hx of HTN, but does not take meds for it.

## 2017-11-14 NOTE — ED Provider Notes (Signed)
[facility] EMERGENCY DEPARTMENT Provider Note   CSN: [id number] Arrival date & time: 11/14/17  1542     History   Chief Complaint Chief Complaint  Patient presents with  . Rash    HPI Victoria Harvey is a 36 y.o. female.  HPI 36 year old African American female with past medical history significant for hypertension presents to the emergency department today for evaluation of blisters and rash to her right foot.  The patient states that this has been on and off for the past year.  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  States that she was using several over-the-counter medications including athlete's foot cream and Benadryl cream states that this did improve her symptoms.  However they came back after she reused her shoes.  Patient states that her feet stay moist most of the day.  She reports intense pruritus to the area.  She also reports some drainage.  States that yesterday she was on her feet all day and 2 blisters on the side of her foot came up.  Patient denies any history of diabetes.  Does have a history of hypertension but is not taking meds for this.  Patient denies any associated symptoms for this including chest pain, shortness breath, headache, vision changes.  Patient denies any associated fevers, paresthesias, weakness.  Reports some pain with ambulation of the right foot.  Denies any associated swelling. Past Medical History:  Diagnosis Date  . Hypertension   . Pinched nerve    in back; recently completed course of meds for sxs  . Pneumonia   . Strep sore throat     Patient Active Problem List   Diagnosis Date Noted  . NVD (normal vaginal delivery) 08/20/2015  . Labor and delivery, indication for care 08/19/2015  . Significant discrepancy between uterine size and clinical dates, antepartum   . [redacted] weeks gestation of pregnancy   . Pneumonia 04/26/2015  . Pneumonia affecting pregnancy in second trimester 04/26/2015  . Abdominal  pain affecting pregnancy 01/25/2015    Past Surgical History:  Procedure Laterality Date  . NO PAST SURGERIES    . WISDOM TOOTH EXTRACTION      OB History    Gravida Para Term Preterm AB Living   _0 0 0 2   SAB TAB Ectopic Multiple Live Births   0 0 0 0 2       Home Medications    Prior to Admission medications   Medication Sig Start Date End Date Taking? Authorizing Provider  albuterol (PROVENTIL HFA;VENTOLIN HFA) 108 (90 Base) MCG/ACT inhaler Inhale 2 puffs into the lungs every 4 (four) hours as needed for wheezing or shortness of breath. 07/31/17   Ashley Murrain, NP  azithromycin (ZITHROMAX Z-PAK) 250 MG tablet Take 2 tablets today and then one tablet PO daily until finished 07/31/17   Debroah Baller M, NP  benzonatate (TESSALON) 100 MG capsule Take 1 capsule (100 mg total) by mouth every 8 (eight) hours. 07/31/17   Ashley Murrain, NP  HYDROcodone-homatropine [facility]) 5-1.5 MG/5ML syrup Take 5 mLs by mouth every 6 (six) hours as needed for cough. 08/04/17   Robinson, [place] N, PA-C  hydrOXYzine (ATARAX/VISTARIL) 10 MG tablet Take 1 tablet (10 mg total) by mouth 3 (three) times daily as needed for itching. 06/03/16   Pisciotta, Elmyra Ricks, PA-C  ibuprofen (ADVIL,MOTRIN) 200 MG tablet Take 200-400 mg by mouth every 6 (six) hours as needed for headache or moderate pain.  [provider]  Prenatal Vit-Fe Fumarate-FA (PRENATAL MULTIVITAMIN) TABS tablet Take 1 tablet by mouth daily at 12 noon.    [provider]    Family History Family History  Problem Relation Age of Onset  . Hypertension Mother   . Hypertension Father   . Cancer Maternal Grandmother        breast  . Stroke Paternal Grandmother     Social History Social History   Tobacco Use  . Smoking status: Former Smoker    Packs/day: 0.00    Types: Cigarettes    Last attempt to quit: 11/24/2014    Years since quitting: 2.9  . Smokeless tobacco: Never Used  Substance Use Topics  . Alcohol use: No  .  Drug use: No     Allergies   Levofloxacin   Review of Systems Review of Systems  Constitutional: Negative for chills and fever.  Eyes: Negative for visual disturbance.  Respiratory: Negative for shortness of breath.   Cardiovascular: Negative for chest pain.  Musculoskeletal: Positive for myalgias.  Skin: Positive for color change, rash and wound.  Neurological: Negative for weakness, numbness and headaches.     Physical Exam Updated Vital Signs BP (!) 173/104 (BP Location: Right Arm)   Pulse 79   Temp 98.3 F (36.8 C) (Oral)   Resp 16   Ht _0  (1.702 m)   Wt 72.6 kg (160 lb)   SpO2 100%   BMI 25.06 kg/m   Physical Exam  Constitutional: She appears well-developed and well-nourished. No distress.  HENT:  Head: Normocephalic and atraumatic.  Eyes: Right eye exhibits no discharge. Left eye exhibits no discharge. No scleral icterus.  Neck: Normal range of motion.  Cardiovascular: Intact distal pulses.  Pulmonary/Chest: No respiratory distress.  Musculoskeletal: Normal range of motion.  Patient has pruritic erythematous erosions and scales between her webs of her toes with associated fissures.  No significant purulent drainage noted.  Patient also has a hyperkeratotic eruptions on the dorsum of her foot with some erosions and sanguinous drainage.  Patient also has 2 blisters to the lateral aspect of the right foot.  These have not ruptured and are not erythematous or warm.  Patient has no erythema or warmth to the foot.  She has full range of motion of all joints of the foot with only minimal pain.  She has brisk cap refill.  DP pulses are 2+ bilaterally.  Sensation intact.  Patient also has some vesicular or bullous lesions to the top of the foot with minimal erythema.  There is no significant purulent drainage noted.  Sensation intact in all dermatomes.  Neurological: She is alert.  Skin: Skin is warm and dry. Capillary refill takes less than 2 seconds. No pallor.    Psychiatric: Her behavior is normal. Judgment and thought content normal.  Nursing note and vitals reviewed.    ED Treatments / Results  Labs (all labs ordered are listed, but only abnormal results are displayed) Labs Reviewed - No data to display  EKG  EKG Interpretation None       Radiology No results found.  Procedures Procedures (including critical care time)  Medications Ordered in ED Medications - No data to display   Initial Impression / Assessment and Plan / ED Course  I have reviewed the triage vital signs and the nursing notes.  Pertinent labs & imaging results that were available during my care of the patient were reviewed by me and considered in my medical decision making (see  chart for details).     Patient presents to the ED for evaluation of rash and blisters to her right foot.  States this has been intermittent for the past year.  States that it will improve with athlete's foot medication, cortisone cream and Benadryl however it will return when she reuses her shoes.  States that she does stand a lot for work.  Patient reports intense itching with some blisters and drainage.  On exam patient does have some erythematous excoriated lesions to the dorsum of her foot.  Also has 2 blisters to the lateral aspect of the foot but do not appear infected.  Patient with macerated skin between her toes.  Initial examination seems consistent with likely tinea pedis.  Patient does get rashes periodically throughout her entire body.  May be some degree of eczema or psoriasis.  Given the mild erythema and drainage will start on antibiotics to cover for infection.  Patient denies any history of diabetes.  X-ray reveals no bony abnormalities.  No signs of lower extremity edema or calf tenderness no be concerning for DVT.  Will start patient on antibiotics, antifungal cream and powder.  Encouraged him to met treatment at home including keeping feet clean and dry.  Encouraged treating  her shoes given this might be reinfecting herself.  Have given her podiatry follow-up.    Patient is hypertensive in the ED with history of same.  States that she is not taking medications for this and does not have a primary care doctor.  Denies any other associated symptoms including chest pain, shortness of breath, headache, vision changes.  Do not feel any further emergent medical management is indicated at this time.  Encouraged follow-up with PCP and have given her resources.  Encouraged very strict return precautions.  Pt is hemodynamically stable, in NAD, & able to ambulate in the ED. Evaluation does not show pathology that would require ongoing emergent intervention or inpatient treatment. I explained the diagnosis to the patient. Pain has been managed & has no complaints prior to dc. Pt is comfortable with above plan and is stable for discharge at this time. All questions were answered prior to disposition. Strict return precautions for f/u to the ED were discussed. Encouraged follow up with PCP.   Final Clinical Impressions(s) / ED Diagnoses   Final diagnoses:  Rash    ED Discharge Orders    None       Doristine Devoid, PA-C 11/14/17 1838    Doristine Devoid, PA-C 11/14/17 Chapman Fitch, MD 11/18/17 [id number]

## 2017-11-14 NOTE — ED Notes (Signed)
Pt reports rash and pain in right foot for over a year. She reports using benadryl, cortisone cream, and athletes foot medications with no relief of symptoms. Pt reports pain in foot when walking.

## 2017-11-14 NOTE — Discharge Instructions (Signed)
Your x-ray showed no bony involvement.  This is likely a bacterial infection versus fungal infection possibly athlete's foot.  Have given you antibiotics including Keflex to take for bacterial infection.  Have given you an athlete's foot cream you may use or you may use the powder given.  Make sure you are treating your shoes with powder.  Keep your feet dry and clean.  He may also take Benadryl for the itching at night.  Follow-up with podiatry.  Return to the ED with any worsening symptoms.  Your blood pressure is also elevated.  You need to follow-up with a primary care doctor to have this rechecked and possibly start medications.  If you develop any chest pain, shortness of breath, headaches, vision changes return to the ED for evaluation.

## 2018-02-13 ENCOUNTER — Other Ambulatory Visit: Payer: Self-pay

## 2018-02-13 ENCOUNTER — Encounter (HOSPITAL_COMMUNITY): Payer: Self-pay | Admitting: Emergency Medicine

## 2018-02-13 ENCOUNTER — Ambulatory Visit (HOSPITAL_COMMUNITY)
Admission: EM | Admit: 2018-02-13 | Discharge: 2018-02-13 | Disposition: A | Discharge status: Home / Self Care | Payer: No Typology Code available for payment source | Attending: Family Medicine | Admitting: Family Medicine

## 2018-02-13 DIAGNOSIS — B349 Viral infection, unspecified: Secondary | ICD-10-CM

## 2018-02-13 MED ORDER — AMOXICILLIN-POT CLAVULANATE 875-125 MG PO TABS: 1.0000 | ORAL_TABLET | Freq: Two times a day (BID) | ORAL | 0 refills | Status: DC

## 2018-02-13 MED ORDER — PREDNISONE 20 MG PO TABS: 40.0000 mg | ORAL_TABLET | Freq: Every day | ORAL | 0 refills | Status: AC

## 2018-02-13 MED ORDER — FLUTICASONE PROPIONATE 50 MCG/ACT NA SUSP: 2.0000 | Freq: Every day | NASAL | 0 refills | Status: DC

## 2018-02-13 MED ORDER — IPRATROPIUM BROMIDE 0.06 % NA SOLN: 2.0000 | Freq: Four times a day (QID) | NASAL | 0 refills | Status: DC

## 2018-02-13 MED ORDER — ALBUTEROL SULFATE HFA 108 (90 BASE) MCG/ACT IN AERS: 1.0000 | INHALATION_SPRAY | Freq: Four times a day (QID) | RESPIRATORY_TRACT | 0 refills | Status: DC | PRN

## 2018-02-13 MED ORDER — BENZONATATE 100 MG PO CAPS: 100.0000 mg | ORAL_CAPSULE | Freq: Three times a day (TID) | ORAL | 0 refills | Status: DC

## 2018-02-13 NOTE — ED Provider Notes (Signed)
[place]    CSN: [id number] Arrival date & time: 02/13/18  1059     History   Chief Complaint Chief Complaint  Patient presents with  . URI    HPI Victoria Harvey is a 37 y.o. female.   36 year old female comes in for 5-day history of URI symptoms.  Has had frontal headache that is constant, aching, worse with cough.  Has had nasal congestion, rhinorrhea.  Mild cough.  No  known fevers, but with chills.  OTC cold medicine and antihistamine without relief.  Former smoker, 0.25 pack year history.      Past Medical History:  Diagnosis Date  . Hypertension   . Pinched nerve    in back; recently completed course of meds for sxs  . Pneumonia   . Strep sore throat     Patient Active Problem List   Diagnosis Date Noted  . NVD (normal vaginal delivery) 08/20/2015  . Labor and delivery, indication for care 08/19/2015  . Significant discrepancy between uterine size and clinical dates, antepartum   . [redacted] weeks gestation of pregnancy   . Pneumonia 04/26/2015  . Pneumonia affecting pregnancy in second trimester 04/26/2015  . Abdominal pain affecting pregnancy 01/25/2015    Past Surgical History:  Procedure Laterality Date  . NO PAST SURGERIES    . WISDOM TOOTH EXTRACTION      OB History    Gravida Para Term Preterm AB Living   2 2 2  0 0 2   SAB TAB Ectopic Multiple Live Births   0 0 0 0 2       Home Medications    Prior to Admission medications   Medication Sig Start Date End Date Taking? Authorizing Provider  ibuprofen (ADVIL,MOTRIN) 200 MG tablet Take 200-400 mg by mouth every 6 (six) hours as needed for headache or moderate pain.   Yes [provider]  albuterol (PROVENTIL HFA;VENTOLIN HFA) 108 (90 Base) MCG/ACT inhaler Inhale 1-2 puffs into the lungs every 6 (six) hours as needed for wheezing or shortness of breath. 02/13/18   Tasia Catchings, Haille Pardi V, PA-C  amoxicillin-clavulanate (AUGMENTIN) 875-125 MG tablet Take 1 tablet by mouth every 12 (twelve)  hours. Fill on 3/20 if symptoms not improving 02/18/18   Tasia Catchings, Jessel Gettinger V, PA-C  benzonatate (TESSALON) 100 MG capsule Take 1 capsule (100 mg total) by mouth every 8 (eight) hours. 02/13/18   Tasia Catchings, Carmelle Bamberg V, PA-C  fluticasone (FLONASE) 50 MCG/ACT nasal spray Place 2 sprays into both nostrils daily. 02/13/18   Tasia Catchings, Tequisha Maahs V, PA-C  ipratropium (ATROVENT) 0.06 % nasal spray Place 2 sprays into both nostrils 4 (four) times daily. 02/13/18   Tasia Catchings, Kyomi Hector V, PA-C  predniSONE (DELTASONE) 20 MG tablet Take 2 tablets (40 mg total) by mouth daily for 3 days. 02/13/18 02/16/18  Ok Edwards, PA-C    Family History Family History  Problem Relation Age of Onset  . Hypertension Mother   . Hypertension Father   . Cancer Maternal Grandmother        breast  . Stroke Paternal Grandmother     Social History Social History   Tobacco Use  . Smoking status: Former Smoker    Packs/day: 0.00    Types: Cigarettes    Last attempt to quit: 11/24/2014    Years since quitting: 3.2  . Smokeless tobacco: Never Used  Substance Use Topics  . Alcohol use: No  . Drug use: No     Allergies   Levofloxacin   Review  of Systems Review of Systems  Reason unable to perform ROS: See HPI as above.     Physical Exam Triage Vital Signs ED Triage Vitals  Enc Vitals Group     BP 02/13/18 1209 (!) 149/92     Pulse Rate 02/13/18 1209 65     Resp --      Temp 02/13/18 1209 98.3 F (36.8 C)     Temp src --      SpO2 02/13/18 1209 100 %     Weight --      Height --      Head Circumference --      Peak Flow --      Pain Score 02/13/18 1206 8     Pain Loc --      Pain Edu? --      Excl. in [place]? --    No data found.  Updated Vital Signs BP (!) 149/92 (BP Location: Left Arm)   Pulse 65   Temp 98.3 F (36.8 C)   SpO2 100%   Physical Exam  Constitutional: She is oriented to person, place, and time. She appears well-developed and well-nourished. No distress.  HENT:  Head: Normocephalic and atraumatic.  Right Ear: External ear and  ear canal normal. Tympanic membrane is erythematous. Tympanic membrane is not bulging.  Left Ear: External ear and ear canal normal. Tympanic membrane is erythematous. Tympanic membrane is not bulging.  Nose: Mucosal edema and rhinorrhea present. Right sinus exhibits frontal sinus tenderness. Right sinus exhibits no maxillary sinus tenderness. Left sinus exhibits frontal sinus tenderness. Left sinus exhibits no maxillary sinus tenderness.  Mouth/Throat: Uvula is midline, oropharynx is clear and moist and mucous membranes are normal.  Eyes: Conjunctivae are normal. Pupils are equal, round, and reactive to light.  Neck: Normal range of motion. Neck supple.  Cardiovascular: Normal rate, regular rhythm and normal heart sounds. Exam reveals no gallop and no friction rub.  No murmur heard. Pulmonary/Chest: Effort normal and breath sounds normal. She has no decreased breath sounds. She has no wheezes. She has no rhonchi. She has no rales.  Lymphadenopathy:    She has no cervical adenopathy.  Neurological: She is alert and oriented to person, place, and time.  Skin: Skin is warm and dry.  Psychiatric: She has a normal mood and affect. Her behavior is normal. Judgment normal.     UC Treatments / Results  Labs (all labs ordered are listed, but only abnormal results are displayed) Labs Reviewed - No data to display  EKG  EKG Interpretation None       Radiology No results found.  Procedures Procedures (including critical care time)  Medications Ordered in UC Medications - No data to display   Initial Impression / Assessment and Plan / UC Course  I have reviewed the triage vital signs and the nursing notes.  Pertinent labs & imaging results that were available during my care of the patient were reviewed by me and considered in my medical decision making (see chart for details).    Discussed with patient history and exam most consistent with viral URI. Symptomatic treatment as needed.  Push fluids.  Rx of Augmentin sent to pharmacy, can fill in 5 days for sinusitis if symptoms not improving.  Return precautions given.   Final Clinical Impressions(s) / UC Diagnoses   Final diagnoses:  Viral syndrome    ED Discharge Orders        Ordered    amoxicillin-clavulanate (AUGMENTIN) 875-125 MG tablet  Every 12 hours     02/13/18 1301    albuterol (PROVENTIL HFA;VENTOLIN HFA) 108 (90 Base) MCG/ACT inhaler  Every 6 hours PRN     02/13/18 1301    benzonatate (TESSALON) 100 MG capsule  Every 8 hours     02/13/18 1301    ipratropium (ATROVENT) 0.06 % nasal spray  4 times daily     02/13/18 1301    fluticasone (FLONASE) 50 MCG/ACT nasal spray  Daily     02/13/18 1301    predniSONE (DELTASONE) 20 MG tablet  Daily     02/13/18 1301        Ok Edwards, PA-C 02/13/18 1305

## 2018-02-13 NOTE — Discharge Instructions (Signed)
Tessalon for cough. Prednisone as directed for sinus pressure. Start flonase, atrovent nasal spray for nasal congestion/drainage. You can use over the counter nasal saline rinse such as neti pot for nasal congestion. Keep hydrated, your urine should be clear to pale yellow in color. Tylenol/motrin for fever and pain. Monitor for any worsening of symptoms, chest pain, shortness of breath, wheezing, swelling of the throat, follow up for reevaluation.   I have refilled your inhaler in case you have wheezing/shortness of breath.  You can fill Augmentin on 3/20 if symptoms not improving for sinus infection.  For sore throat try using a honey-based tea. Use 3 teaspoons of honey with juice squeezed from half lemon. Place shaved pieces of ginger into 1/2-1 cup of water and warm over stove top. Then mix the ingredients and repeat every 4 hours as needed.

## 2018-02-13 NOTE — ED Triage Notes (Signed)
Pt reports chills, headache, nasal congestion and mild cough since Monday.

## 2018-07-18 ENCOUNTER — Encounter

## 2019-01-23 ENCOUNTER — Ambulatory Visit (HOSPITAL_COMMUNITY)
Admission: EM | Admit: 2019-01-23 | Discharge: 2019-01-23 | Disposition: A | Discharge status: Home / Self Care | Payer: Medicaid Other | Attending: Family Medicine | Admitting: Family Medicine

## 2019-01-23 ENCOUNTER — Other Ambulatory Visit: Payer: Self-pay

## 2019-01-23 ENCOUNTER — Encounter (HOSPITAL_COMMUNITY): Payer: Self-pay

## 2019-01-23 DIAGNOSIS — N390 Urinary tract infection, site not specified: Secondary | ICD-10-CM

## 2019-01-23 LAB — POCT URINALYSIS DIP (DEVICE)
Bilirubin Urine: NEGATIVE
Glucose, UA: NEGATIVE mg/dL
Ketones, ur: 15 mg/dL — AB
Nitrite: NEGATIVE
Protein, ur: NEGATIVE mg/dL
Specific Gravity, Urine: 1.025 (ref 1.005–1.030)
Urobilinogen, UA: 1 mg/dL (ref 0.0–1.0)
pH: 6.5 (ref 5.0–8.0)

## 2019-01-23 MED ORDER — CEPHALEXIN 500 MG PO CAPS: 500.0000 mg | ORAL_CAPSULE | Freq: Four times a day (QID) | ORAL | 0 refills | Status: DC

## 2019-01-23 NOTE — ED Notes (Signed)
Pt discharged by provider.

## 2019-01-23 NOTE — ED Triage Notes (Signed)
Pt presents to [facility] for possible UTI x2 days, pt complains of urinary frequency, lower abdominal pain, and odor.

## 2019-01-23 NOTE — ED Provider Notes (Signed)
[place]    CSN: [id number] Arrival date & time: 01/23/19  1556     History   Chief Complaint Chief Complaint  Patient presents with  . Urinary Tract Infection    HPI Victoria Harvey is a 37 y.o. female.   38 year old established [place] urgent care patient who presents with UTI symptoms for 2 days.   pt complains of urinary frequency, lower abdominal pain, and odor.   Patient has not had a urinary tract infection before.  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     Past Medical History:  Diagnosis Date  . Hypertension   . Pinched nerve    in back; recently completed course of meds for sxs  . Pneumonia   . Strep sore throat     Patient Active Problem List   Diagnosis Date Noted  . NVD (normal vaginal delivery) 08/20/2015  . Labor and delivery, indication for care 08/19/2015  . Significant discrepancy between uterine size and clinical dates, antepartum   . [redacted] weeks gestation of pregnancy   . Pneumonia 04/26/2015  . Pneumonia affecting pregnancy in second trimester 04/26/2015  . Abdominal pain affecting pregnancy 01/25/2015    Past Surgical History:  Procedure Laterality Date  . NO PAST SURGERIES    . WISDOM TOOTH EXTRACTION      OB History    Gravida  2   Para  2   Term  2   Preterm  0   AB  0   Living  2     SAB  0   TAB  0   Ectopic  0   Multiple  0   Live Births  2            Home Medications    Prior to Admission medications   Medication Sig Start Date End Date Taking? Authorizing Provider  cephALEXin (KEFLEX) 500 MG capsule Take 1 capsule (500 mg total) by mouth 4 (four) times daily. 01/23/19   Robyn Haber, MD    Family History Family History  Problem Relation Age of Onset  . Hypertension Mother   . Hypertension Father   . Cancer Maternal Grandmother        breast  . Stroke Paternal Grandmother     Social History Social History   Tobacco Use  . Smoking status: Former Smoker   Packs/day: 0.00    Types: Cigarettes    Last attempt to quit: 11/24/2014    Years since quitting: 4.1  . Smokeless tobacco: Never Used  Substance Use Topics  . Alcohol use: No  . Drug use: No     Allergies   Levofloxacin   Review of Systems Review of Systems   Physical Exam Triage Vital Signs ED Triage Vitals  Enc Vitals Group     BP 01/23/19 1608 (!) 161/93     Pulse Rate 01/23/19 1608 86     Resp 01/23/19 1608 17     Temp 01/23/19 1608 98.9 F (37.2 C)     Temp Source 01/23/19 1608 Oral     SpO2 01/23/19 1608 100 %     Weight --      Height --      Head Circumference --      Peak Flow --      Pain Score 01/23/19 1610 0     Pain Loc --      Pain Edu? --      Excl. in [place]? --  No data found.  Updated Vital Signs BP (!) 161/93 (BP Location: Left Arm)   Pulse 86   Temp 98.9 F (37.2 C) (Oral)   Resp 17   SpO2 100%   Visual Acuity  Physical Exam Vitals signs and nursing note reviewed.  Constitutional:      Appearance: Normal appearance. She is normal weight.  HENT:     Head: Normocephalic.  Pulmonary:     Effort: Pulmonary effort is normal.  Abdominal:     General: Bowel sounds are normal.     Tenderness: There is no abdominal tenderness. There is no right CVA tenderness, left CVA tenderness or guarding.  Musculoskeletal: Normal range of motion.  Skin:    General: Skin is warm.  Neurological:     General: No focal deficit present.     Mental Status: She is alert and oriented to person, place, and time.  Psychiatric:        Mood and Affect: Mood normal.      UC Treatments / Results  Labs (all labs ordered are listed, but only abnormal results are displayed) Labs Reviewed  POCT URINALYSIS DIP (DEVICE) - Abnormal; Notable for the following components:      Result Value   Ketones, ur 15 (*)    Hgb urine dipstick MODERATE (*)    Leukocytes,Ua SMALL (*)    All other components within normal limits  URINE CULTURE     EKG None  Radiology No results found.  Procedures Procedures (including critical care time)  Medications Ordered in UC Medications - No data to display  Initial Impression / Assessment and Plan / UC Course  I have reviewed the triage vital signs and the nursing notes.  Pertinent labs & imaging results that were available during my care of the patient were reviewed by me and considered in my medical decision making (see chart for details).    Final Clinical Impressions(s) / UC Diagnoses   Final diagnoses:  Lower urinary tract infectious disease   Discharge Instructions   None    ED Prescriptions    Medication Sig Dispense Auth. Provider   cephALEXin (KEFLEX) 500 MG capsule Take 1 capsule (500 mg total) by mouth 4 (four) times daily. 20 capsule Robyn Haber, MD     Controlled Substance Prescriptions [place] Controlled Substance Registry consulted? Not Applicable   Robyn Haber, MD 01/23/19 [phone]

## 2020-07-05 ENCOUNTER — Other Ambulatory Visit: Payer: Self-pay

## 2020-07-05 ENCOUNTER — Encounter (HOSPITAL_COMMUNITY): Payer: Self-pay

## 2020-07-05 ENCOUNTER — Ambulatory Visit (HOSPITAL_COMMUNITY)
Admission: EM | Admit: 2020-07-05 | Discharge: 2020-07-05 | Disposition: A | Discharge status: Home / Self Care | Payer: Self-pay | Attending: Emergency Medicine | Admitting: Emergency Medicine

## 2020-07-05 DIAGNOSIS — J029 Acute pharyngitis, unspecified: Secondary | ICD-10-CM | POA: Insufficient documentation

## 2020-07-05 LAB — POCT RAPID STREP A, ED / UC: Streptococcus, Group A Screen (Direct): NEGATIVE

## 2020-07-05 NOTE — ED Provider Notes (Signed)
[place]    CSN: [id number] Arrival date & time: 07/05/20  0841      History   Chief Complaint Chief Complaint  Patient presents with  . Sore Throat    HPI Victoria Harvey is a 39 y.o. female.   [name] presents with complaints of sore throat which started this morning. Went to another urgent care this morning and had a rapid covid test completed which was negative. No fevers. No body aches. Slight headache. No skin rash. No ear pain. No other URI symptoms. No known ill contacts. Works as a Counsellor. No history of covid-19 and has not received vaccination. Also with a right upper tooth pain, requires a root canal, it has not scheduled.    ROS per HPI, negative if not otherwise mentioned.      Past Medical History:  Diagnosis Date  . Hypertension   . Pinched nerve    in back; recently completed course of meds for sxs  . Pneumonia   . Strep sore throat     Patient Active Problem List   Diagnosis Date Noted  . NVD (normal vaginal delivery) 08/20/2015  . Labor and delivery, indication for care 08/19/2015  . Significant discrepancy between uterine size and clinical dates, antepartum   . [redacted] weeks gestation of pregnancy   . Pneumonia 04/26/2015  . Pneumonia affecting pregnancy in second trimester 04/26/2015  . Abdominal pain affecting pregnancy 01/25/2015    Past Surgical History:  Procedure Laterality Date  . NO PAST SURGERIES    . WISDOM TOOTH EXTRACTION      OB History    Gravida  2   Para  2   Term  2   Preterm  0   AB  0   Living  2     SAB  0   TAB  0   Ectopic  0   Multiple  0   Live Births  2            Home Medications    Prior to Admission medications   Medication Sig Start Date End Date Taking? Authorizing Provider  cephALEXin (KEFLEX) 500 MG capsule Take 1 capsule (500 mg total) by mouth 4 (four) times daily. 01/23/19   Robyn Haber, MD    Family History Family History  Problem Relation Age of  Onset  . Hypertension Mother   . Hypertension Father   . Cancer Maternal Grandmother        breast  . Stroke Paternal Grandmother     Social History Social History   Tobacco Use  . Smoking status: Former Smoker    Packs/day: 0.00    Types: Cigarettes    Quit date: 11/24/2014    Years since quitting: 5.6  . Smokeless tobacco: Never Used  Vaping Use  . Vaping Use: Never used  Substance Use Topics  . Alcohol use: No  . Drug use: No     Allergies   Levofloxacin   Review of Systems Review of Systems   Physical Exam Triage Vital Signs ED Triage Vitals  Enc Vitals Group     BP 07/05/20 0916 111/68     Pulse Rate 07/05/20 0916 88     Resp 07/05/20 0916 16     Temp 07/05/20 0916 98.2 F (36.8 C)     Temp Source 07/05/20 0916 Oral     SpO2 07/05/20 0916 100 %     Weight --      Height --  Head Circumference --      Peak Flow --      Pain Score 07/05/20 0917 0     Pain Loc --      Pain Edu? --      Excl. in [place]? --    No data found.  Updated Vital Signs BP 111/68 (BP Location: Right Arm)   Pulse 88   Temp 98.2 F (36.8 C) (Oral)   Resp 16   SpO2 100%   Visual Acuity Right Eye Distance:   Left Eye Distance:   Bilateral Distance:    Right Eye Near:   Left Eye Near:    Bilateral Near:     Physical Exam Constitutional:      General: She is not in acute distress.    Appearance: She is well-developed.  HENT:     Mouth/Throat:     Tonsils: No tonsillar exudate. 1+ on the right. 1+ on the left.  Cardiovascular:     Rate and Rhythm: Normal rate.  Pulmonary:     Effort: Pulmonary effort is normal.  Skin:    General: Skin is warm and dry.  Neurological:     Mental Status: She is alert and oriented to person, place, and time.      UC Treatments / Results  Labs (all labs ordered are listed, but only abnormal results are displayed) Labs Reviewed  CULTURE, GROUP A STREP [facility])  POCT RAPID STREP A, ED / UC    EKG   Radiology No results  found.  Procedures Procedures (including critical care time)  Medications Ordered in UC Medications - No data to display  Initial Impression / Assessment and Plan / UC Course  I have reviewed the triage vital signs and the nursing notes.  Pertinent labs & imaging results that were available during my care of the patient were reviewed by me and considered in my medical decision making (see chart for details).     Non toxic. Benign physical exam.  Afebrile. Negative rapid covid PTA. Negative rapid strep here today with culture pending. History and physical consistent with viral illness.  Supportive cares recommended. Return precautions provided. Patient verbalized understanding and agreeable to plan.   Final Clinical Impressions(s) / UC Diagnoses   Final diagnoses:  Sore throat     Discharge Instructions     Negative rapid strep here today, a follow up culture is in process as well, we would call you if this were to come back positive.  Throat lozenges, gargles, chloraseptic spray, warm teas, popsicles etc to help with throat pain.   Push fluids to ensure adequate hydration and keep secretions thin.  Tylenol and/or ibuprofen as needed for pain or fevers.   If symptoms worsen or do not improve in the next week to return to be seen or to follow up with your PCP.      ED Prescriptions    None     PDMP not reviewed this encounter.   Zigmund Gottron, NP 07/05/20 [phone]

## 2020-07-05 NOTE — ED Triage Notes (Signed)
Pt presents to UC for sore thraot x this morning. Pt states she woke up with sore throat, no other symptoms. Pt had negative covid test today. Pt denies nasal congestion, cough, fever, n/v/d, loss of taste or smell, body aches. Pt concerned for strep v. Allergies. PT states she took advil with minimal relief. Last dose 0830.

## 2020-07-05 NOTE — Discharge Instructions (Addendum)
Negative rapid strep here today, a follow up culture is in process as well, we would call you if this were to come back positive.  Throat lozenges, gargles, chloraseptic spray, warm teas, popsicles etc to help with throat pain.   Push fluids to ensure adequate hydration and keep secretions thin.  Tylenol and/or ibuprofen as needed for pain or fevers.   If symptoms worsen or do not improve in the next week to return to be seen or to follow up with your PCP.

## 2020-07-07 LAB — CULTURE, GROUP A STREP (THRC)

## 2020-07-19 ENCOUNTER — Other Ambulatory Visit: Payer: Self-pay

## 2020-08-09 ENCOUNTER — Other Ambulatory Visit: Payer: Self-pay

## 2021-01-17 ENCOUNTER — Ambulatory Visit (HOSPITAL_COMMUNITY)
Admission: EM | Admit: 2021-01-17 | Discharge: 2021-01-17 | Disposition: A | Discharge status: Home / Self Care | Payer: Self-pay

## 2021-01-17 ENCOUNTER — Other Ambulatory Visit: Payer: Self-pay

## 2021-01-17 ENCOUNTER — Encounter (HOSPITAL_COMMUNITY): Payer: Self-pay | Admitting: Emergency Medicine

## 2021-01-17 DIAGNOSIS — K047 Periapical abscess without sinus: Secondary | ICD-10-CM

## 2021-01-17 MED ORDER — IBUPROFEN 800 MG PO TABS: 800.0000 mg | ORAL_TABLET | Freq: Three times a day (TID) | ORAL | 0 refills | Status: DC

## 2021-01-17 MED ORDER — AMOXICILLIN-POT CLAVULANATE 875-125 MG PO TABS: 1.0000 | ORAL_TABLET | Freq: Two times a day (BID) | ORAL | 0 refills | Status: DC

## 2021-01-17 NOTE — ED Provider Notes (Signed)
[place]    CSN: [id number] Arrival date & time: 01/17/21  1629      History   Chief Complaint Chief Complaint  Patient presents with  . Dental Pain    HPI Victoria Harvey is a 40 y.o. female.   HPI   Dental Pain: Patient states that started last night she started having some left upper molar pain.  No injury to tooth.  She has tried swishing with peroxide, Orajel, Listerine, Tylenol, ibuprofen without much relief.  She denies any known fevers, trouble breathing or trouble swallowing.  She is working closely with a dentist to help with dental work but has not had this particular tooth work done yet.  Past Medical History:  Diagnosis Date  . Hypertension   . Pinched nerve    in back; recently completed course of meds for sxs  . Pneumonia   . Strep sore throat     Patient Active Problem List   Diagnosis Date Noted  . NVD (normal vaginal delivery) 08/20/2015  . Labor and delivery, indication for care 08/19/2015  . Significant discrepancy between uterine size and clinical dates, antepartum   . [redacted] weeks gestation of pregnancy   . Pneumonia 04/26/2015  . Pneumonia affecting pregnancy in second trimester 04/26/2015  . Abdominal pain affecting pregnancy 01/25/2015    Past Surgical History:  Procedure Laterality Date  . NO PAST SURGERIES    . WISDOM TOOTH EXTRACTION      OB History    Gravida  2   Para  2   Term  2   Preterm  0   AB  0   Living  2     SAB  0   IAB  0   Ectopic  0   Multiple  0   Live Births  2            Home Medications    Prior to Admission medications   Medication Sig Start Date End Date Taking? Authorizing Provider  acetaminophen (TYLENOL) 325 MG tablet Take 650 mg by mouth every 6 (six) hours as needed.   Yes [provider]  ibuprofen (ADVIL) 200 MG tablet Take 200 mg by mouth every 6 (six) hours as needed.   Yes [provider]  cephALEXin (KEFLEX) 500 MG capsule Take 1 capsule (500 mg  total) by mouth 4 (four) times daily. 01/23/19   Robyn Haber, MD    Family History Family History  Problem Relation Age of Onset  . Hypertension Mother   . Hypertension Father   . Cancer Maternal Grandmother        breast  . Stroke Paternal Grandmother     Social History Social History   Tobacco Use  . Smoking status: Former Smoker    Packs/day: 0.00    Types: Cigarettes    Quit date: 11/24/2014    Years since quitting: 6.1  . Smokeless tobacco: Never Used  Vaping Use  . Vaping Use: Never used  Substance Use Topics  . Alcohol use: No  . Drug use: No     Allergies   Levofloxacin   Review of Systems Review of Systems  As stated above in HPI  Physical Exam Triage Vital Signs ED Triage Vitals  Enc Vitals Group     BP 01/17/21 1651 (!) 152/97     Pulse Rate 01/17/21 1651 93     Resp 01/17/21 1651 20     Temp 01/17/21 1651 100.2 F (37.9 C)  Temp Source 01/17/21 1651 Oral     SpO2 01/17/21 1651 100 %     Weight --      Height --      Head Circumference --      Peak Flow --      Pain Score 01/17/21 1647 10     Pain Loc --      Pain Edu? --      Excl. in [place]? --    No data found.  Updated Vital Signs BP (!) 152/97 (BP Location: Right Arm)   Pulse 93   Temp 100.2 F (37.9 C) (Oral)   Resp 20   SpO2 100%   Physical Exam Vitals and nursing note reviewed.  Constitutional:      General: She is not in acute distress.    Appearance: She is not ill-appearing, toxic-appearing or diaphoretic.  HENT:     Nose: Nose normal. No congestion or rhinorrhea.     Mouth/Throat:     Lips: Pink.     Mouth: Mucous membranes are moist. Oral lesions present. No angioedema.     Dentition: Abnormal dentition. Has dentures. Dental tenderness, gingival swelling, dental caries, dental abscesses and gum lesions present.     Tongue: No lesions. Tongue does not deviate from midline.     Palate: No mass and lesions.     Pharynx: Oropharynx is clear. Uvula midline. No  pharyngeal swelling, oropharyngeal exudate, posterior oropharyngeal erythema or uvula swelling.     Tonsils: No tonsillar exudate or tonsillar abscesses.      Comments: MIP 2 Neurological:     Mental Status: She is alert.      UC Treatments / Results  Labs (all labs ordered are listed, but only abnormal results are displayed) Labs Reviewed - No data to display  EKG   Radiology No results found.  Procedures Procedures (including critical care time)  Medications Ordered in UC Medications - No data to display  Initial Impression / Assessment and Plan / UC Course  I have reviewed the triage vital signs and the nursing notes.  Pertinent labs & imaging results that were available during my care of the patient were reviewed by me and considered in my medical decision making (see chart for details).     New. Treating with augmentin.  We discussed how to use this antibiotic along with common potential side effects and precautions.  She will need to contact her dentist tomorrow and they will likely want to see her within 7 to 14 days.  We discussed red flag symptoms that would warrant ER visit. Final Clinical Impressions(s) / UC Diagnoses   Final diagnoses:  None   Discharge Instructions   None    ED Prescriptions    None     PDMP not reviewed this encounter.   Hughie Closs, [place] 01/17/21 1744

## 2021-01-17 NOTE — ED Triage Notes (Signed)
Dental pain started last night.  Patient has been swishing peroxide, oragel, taking tylenol ( extra strength) and ibuprofen  patient points to top left tooth

## 2021-02-02 ENCOUNTER — Encounter (HOSPITAL_COMMUNITY): Payer: Self-pay

## 2021-02-02 ENCOUNTER — Ambulatory Visit (HOSPITAL_COMMUNITY)
Admission: EM | Admit: 2021-02-02 | Discharge: 2021-02-02 | Disposition: A | Discharge status: Home / Self Care | Payer: Self-pay | Attending: Emergency Medicine | Admitting: Emergency Medicine

## 2021-02-02 ENCOUNTER — Other Ambulatory Visit: Payer: Self-pay

## 2021-02-02 DIAGNOSIS — K0889 Other specified disorders of teeth and supporting structures: Secondary | ICD-10-CM

## 2021-02-02 MED ORDER — CLINDAMYCIN HCL 150 MG PO CAPS: 450.0000 mg | ORAL_CAPSULE | Freq: Three times a day (TID) | ORAL | 0 refills | Status: AC

## 2021-02-02 MED ORDER — NAPROXEN 500 MG PO TABS: 500.0000 mg | ORAL_TABLET | Freq: Two times a day (BID) | ORAL | 0 refills | Status: DC

## 2021-02-02 NOTE — ED Provider Notes (Signed)
[place]    CSN: [id number] Arrival date & time: 02/02/21  0800      History   Chief Complaint Chief Complaint  Patient presents with  . Dental Pain    HPI Victoria Harvey is a 40 y.o. female.   Victoria Harvey presents with complaints of right upper dental pain which has been worsening over the past two days. No facial swelling or fevers. Has a known broken tooth, which is the anchor for her upper partial. She does not follow with a dentist. No obvious known drainage or abscess present. On chart review was seen 2/16 with similar and provided with augmentin.     ROS per HPI, negative if not otherwise mentioned.      Past Medical History:  Diagnosis Date  . Hypertension   . Pinched nerve    in back; recently completed course of meds for sxs  . Pneumonia   . Strep sore throat     Patient Active Problem List   Diagnosis Date Noted  . NVD (normal vaginal delivery) 08/20/2015  . Labor and delivery, indication for care 08/19/2015  . Significant discrepancy between uterine size and clinical dates, antepartum   . [redacted] weeks gestation of pregnancy   . Pneumonia 04/26/2015  . Pneumonia affecting pregnancy in second trimester 04/26/2015  . Abdominal pain affecting pregnancy 01/25/2015    Past Surgical History:  Procedure Laterality Date  . NO PAST SURGERIES    . WISDOM TOOTH EXTRACTION      OB History    Gravida  2   Para  2   Term  2   Preterm  0   AB  0   Living  2     SAB  0   IAB  0   Ectopic  0   Multiple  0   Live Births  2            Home Medications    Prior to Admission medications   Medication Sig Start Date End Date Taking? Authorizing Provider  clindamycin (CLEOCIN) 150 MG capsule Take 3 capsules (450 mg total) by mouth 3 (three) times daily for 7 days. 02/02/21 02/09/21 Yes Arshdeep Bolger, Lanelle Bal B, NP  naproxen (NAPROSYN) 500 MG tablet Take 1 tablet (500 mg total) by mouth 2 (two) times daily. 02/02/21  Yes Augusto Gamble B,  NP  acetaminophen (TYLENOL) 325 MG tablet Take 650 mg by mouth every 6 (six) hours as needed.    [provider]    Family History Family History  Problem Relation Age of Onset  . Hypertension Mother   . Hypertension Father   . Cancer Maternal Grandmother        breast  . Stroke Paternal Grandmother     Social History Social History   Tobacco Use  . Smoking status: Former Smoker    Packs/day: 0.00    Types: Cigarettes    Quit date: 11/24/2014    Years since quitting: 6.1  . Smokeless tobacco: Never Used  Vaping Use  . Vaping Use: Never used  Substance Use Topics  . Alcohol use: No  . Drug use: No     Allergies   Levofloxacin   Review of Systems Review of Systems   Physical Exam Triage Vital Signs ED Triage Vitals  Enc Vitals Group     BP 02/02/21 0815 (!) 145/94     Pulse Rate 02/02/21 0815 86     Resp 02/02/21 0815 19  Temp 02/02/21 0815 98.3 F (36.8 C)     Temp Source 02/02/21 0815 Oral     SpO2 02/02/21 0815 100 %     Weight --      Height --      Head Circumference --      Peak Flow --      Pain Score 02/02/21 0813 9     Pain Loc --      Pain Edu? --      Excl. in [place]? --    No data found.  Updated Vital Signs BP (!) 145/94 (BP Location: Right Arm)   Pulse 86   Temp 98.3 F (36.8 C) (Oral)   Resp 19   LMP 01/26/2021 (Exact Date)   SpO2 100%   Visual Acuity Right Eye Distance:   Left Eye Distance:   Bilateral Distance:    Right Eye Near:   Left Eye Near:    Bilateral Near:     Physical Exam Constitutional:      General: She is not in acute distress.    Appearance: She is well-developed.  HENT:     Mouth/Throat:     Dentition: Abnormal dentition. Dental tenderness present.   Cardiovascular:     Rate and Rhythm: Normal rate.  Pulmonary:     Effort: Pulmonary effort is normal.  Skin:    General: Skin is warm and dry.  Neurological:     Mental Status: She is alert and oriented to person, place, and time.       UC Treatments / Results  Labs (all labs ordered are listed, but only abnormal results are displayed) Labs Reviewed - No data to display  EKG   Radiology No results found.  Procedures Procedures (including critical care time)  Medications Ordered in UC Medications - No data to display  Initial Impression / Assessment and Plan / UC Course  I have reviewed the triage vital signs and the nursing notes.  Pertinent labs & imaging results that were available during my care of the patient were reviewed by me and considered in my medical decision making (see chart for details).    Worsening dental pain with abnormal dentition. No visible abscess. No obvious deep space infection, no trismus or fevers. clinda provided and pain management discussed. Emphasized follow up with dentist for definitive treatment. Patient verbalized understanding and agreeable to plan.   Final Clinical Impressions(s) / UC Diagnoses   Final diagnoses:  Pain, dental     Discharge Instructions     Naproxen twice a day, take with food. Don't take additional ibuprofen.  Complete course of antibiotics.  Please follow up with a dentist for definitive treatment.    ED Prescriptions    Medication Sig Dispense Auth. Provider   naproxen (NAPROSYN) 500 MG tablet Take 1 tablet (500 mg total) by mouth 2 (two) times daily. 30 tablet Augusto Gamble B, NP   clindamycin (CLEOCIN) 150 MG capsule Take 3 capsules (450 mg total) by mouth 3 (three) times daily for 7 days. 63 capsule Zigmund Gottron, NP     PDMP not reviewed this encounter.   Zigmund Gottron, NP 02/02/21 [phone]

## 2021-02-02 NOTE — Discharge Instructions (Signed)
Naproxen twice a day, take with food. Don't take additional ibuprofen.  Complete course of antibiotics.  Please follow up with a dentist for definitive treatment.

## 2021-02-02 NOTE — ED Triage Notes (Signed)
Pt presents with top right dental pain. Pt states her top right teeth have bothered her before. She states it bothers her more now and states the pain is worse. Pt states she did not follow up with the dentist after being seen in February.

## 2021-04-30 ENCOUNTER — Emergency Department (HOSPITAL_COMMUNITY): Payer: Medicaid Other

## 2021-04-30 ENCOUNTER — Encounter (HOSPITAL_COMMUNITY): Payer: Self-pay | Admitting: Emergency Medicine

## 2021-04-30 ENCOUNTER — Emergency Department (HOSPITAL_COMMUNITY)
Admission: EM | Admit: 2021-04-30 | Discharge: 2021-04-30 | Disposition: A | Discharge status: Home / Self Care | Payer: Medicaid Other | Attending: Emergency Medicine | Admitting: Emergency Medicine

## 2021-04-30 ENCOUNTER — Other Ambulatory Visit: Payer: Self-pay

## 2021-04-30 DIAGNOSIS — U071 COVID-19: Secondary | ICD-10-CM | POA: Insufficient documentation

## 2021-04-30 DIAGNOSIS — R519 Headache, unspecified: Secondary | ICD-10-CM | POA: Diagnosis present

## 2021-04-30 LAB — RESP PANEL BY RT-PCR (FLU A&B, COVID) ARPGX2
Influenza A by PCR: NEGATIVE
Influenza B by PCR: NEGATIVE
SARS Coronavirus 2 by RT PCR: POSITIVE — AB

## 2021-04-30 MED ORDER — SODIUM CHLORIDE 0.9 % IV BOLUS
1000.0000 mL | Freq: Once | INTRAVENOUS | Status: AC
  Administered 2021-04-30: 1000 mL via INTRAVENOUS

## 2021-04-30 MED ORDER — DIPHENHYDRAMINE HCL 50 MG/ML IJ SOLN
25.0000 mg | Freq: Once | INTRAMUSCULAR | Status: AC
  Administered 2021-04-30: 25 mg via INTRAVENOUS
  Filled 2021-04-30: qty 1

## 2021-04-30 MED ORDER — PROCHLORPERAZINE EDISYLATE 10 MG/2ML IJ SOLN
10.0000 mg | Freq: Once | INTRAMUSCULAR | Status: AC
  Administered 2021-04-30: 10 mg via INTRAVENOUS
  Filled 2021-04-30: qty 2

## 2021-04-30 NOTE — ED Notes (Signed)
Pt transported to CT AXO X4

## 2021-04-30 NOTE — ED Provider Notes (Signed)
Emergency Medicine Provider Triage Evaluation Note  Victoria Harvey , a 40 y.o. female  was evaluated in triage.  Pt complains of headache, sore throat, rhinorrhea, congestion for the last 4-5 days. Denies fevers  Review of Systems  Positive: headache, sore throat, rhinorrhea, congestion, cough Negative: fevers  Physical Exam  There were no vitals taken for this visit. Gen:   Awake, no distress   Resp:  Normal effort  MSK:   Moves extremities without difficulty  Other:  Cranial nerves II-XII intact. Moving all extremities  Medical Decision Making  Medically screening exam initiated at 12:17 PM.  Appropriate orders placed.  [name] was informed that the remainder of the evaluation will be completed by another provider, this initial triage assessment does not replace that evaluation, and the importance of remaining in the ED until their evaluation is complete.    Bishop Dublin 04/30/21 [place], MD 05/04/21 [phone]

## 2021-04-30 NOTE — ED Triage Notes (Signed)
C/o "head pressure" x 4 days.  States she tested negative for COVID on Friday.  States she did have other symptoms- sore throat/runny nose that have resolved.

## 2021-04-30 NOTE — ED Notes (Signed)
Pt resting in room. Provided ginger ale approved by provider

## 2021-04-30 NOTE — ED Provider Notes (Signed)
[facility]PART[facility] Provider Note   CSN: [id number] Arrival date & time: 04/30/21  1202     History No chief complaint on file.   Victoria Harvey is a 40 y.o. female.  HPI      One week ago had cold-like symptoms, cough, congestion, sore throat, runny nose. Had COVID test Thursday was negative. Headache started a few days ago around Saturday, feels like a pressure, frontal headache.  10/10 pain, peaked maybe after a few hours.  Never had a headache like this.  Nausea, lightheaded. No vomiting, Denies numbness, weakness, difficulty talking or walking, visual changes or facial droop.  No fevers.  No ear pain.  No falls or trauma.  Took extra strength tylenolo, sinus pressure medicine, mucinex, motrin without relief.   No family history of aneurysm.  Bright lights make it worse.  No known fam hx of migraines.   Past Medical History:  Diagnosis Date  . Hypertension   . Pinched nerve    in back; recently completed course of meds for sxs  . Pneumonia   . Strep sore throat     Patient Active Problem List   Diagnosis Date Noted  . NVD (normal vaginal delivery) 08/20/2015  . Labor and delivery, indication for care 08/19/2015  . Significant discrepancy between uterine size and clinical dates, antepartum   . [redacted] weeks gestation of pregnancy   . Pneumonia 04/26/2015  . Pneumonia affecting pregnancy in second trimester 04/26/2015  . Abdominal pain affecting pregnancy 01/25/2015    Past Surgical History:  Procedure Laterality Date  . NO PAST SURGERIES    . WISDOM TOOTH EXTRACTION       OB History    Gravida  2   Para  2   Term  2   Preterm  0   AB  0   Living  2     SAB  0   IAB  0   Ectopic  0   Multiple  0   Live Births  2           Family History  Problem Relation Age of Onset  . Hypertension Mother   . Hypertension Father   . Cancer Maternal Grandmother        breast  . Stroke Paternal Grandmother     Social  History   Tobacco Use  . Smoking status: Former Smoker    Packs/day: 0.00    Types: Cigarettes    Quit date: 11/24/2014    Years since quitting: 6.4  . Smokeless tobacco: Never Used  Vaping Use  . Vaping Use: Never used  Substance Use Topics  . Alcohol use: No  . Drug use: No    Home Medications Prior to Admission medications   Medication Sig Start Date End Date Taking? Authorizing Provider  acetaminophen (TYLENOL) 325 MG tablet Take 650 mg by mouth every 6 (six) hours as needed.    [provider]  naproxen (NAPROSYN) 500 MG tablet Take 1 tablet (500 mg total) by mouth 2 (two) times daily. 02/02/21   Zigmund Gottron, NP    Allergies    Levofloxacin  Review of Systems   Review of Systems  Constitutional: Negative for fatigue and fever.  HENT: Negative for congestion, facial swelling and sore throat.   Eyes: Negative for visual disturbance.  Respiratory: Negative for cough and shortness of breath.   Cardiovascular: Negative for chest pain.  Gastrointestinal: Positive for nausea. Negative for abdominal pain, diarrhea  and vomiting ( ).  Genitourinary: Negative for difficulty urinating.  Musculoskeletal: Negative for back pain and neck pain.  Skin: Negative for rash.  Neurological: Positive for light-headedness and headaches. Negative for syncope.    Physical Exam Updated Vital Signs BP (!) 165/94 (BP Location: Right Arm)   Pulse 94   Temp 97.9 F (36.6 C) (Oral)   Resp 15   LMP 04/16/2021   SpO2 100%   Physical Exam Vitals and nursing note reviewed.  Constitutional:      General: She is not in acute distress.    Appearance: Normal appearance. She is well-developed. She is not ill-appearing or diaphoretic.  HENT:     Head: Normocephalic and atraumatic.     Right Ear: Tympanic membrane normal.     Left Ear: Tympanic membrane normal.  Eyes:     General: No visual field deficit.    Extraocular Movements: Extraocular movements intact.      Conjunctiva/sclera: Conjunctivae normal.     Pupils: Pupils are equal, round, and reactive to light.  Cardiovascular:     Rate and Rhythm: Normal rate and regular rhythm.     Pulses: Normal pulses.  Pulmonary:     Effort: Pulmonary effort is normal. No respiratory distress.  Abdominal:     General: There is no distension.     Palpations: Abdomen is soft.     Tenderness: There is no abdominal tenderness. There is no guarding.  Musculoskeletal:        General: No swelling or tenderness.     Cervical back: Normal range of motion.  Skin:    General: Skin is warm and dry.     Findings: No erythema or rash.  Neurological:     General: No focal deficit present.     Mental Status: She is alert and oriented to person, place, and time.     GCS: GCS eye subscore is 4. GCS verbal subscore is 5. GCS motor subscore is 6.     Cranial Nerves: No cranial nerve deficit, dysarthria or facial asymmetry.     Sensory: No sensory deficit.     Motor: No weakness or tremor.     Coordination: Coordination normal. Finger-Nose-Finger Test normal.     Gait: Gait normal.     ED Results / Procedures / Treatments   Labs (all labs ordered are listed, but only abnormal results are displayed) Labs Reviewed  RESP PANEL BY RT-PCR (FLU A&B, COVID) ARPGX2 - Abnormal; Notable for the following components:      Result Value   SARS Coronavirus 2 by RT PCR POSITIVE (*)    All other components within normal limits    EKG None  Radiology CT Head Wo Contrast  Result Date: 04/30/2021 CLINICAL DATA:  Headache. EXAM: CT HEAD WITHOUT CONTRAST TECHNIQUE: Contiguous axial images were obtained from the base of the skull through the vertex without intravenous contrast. COMPARISON:  None. FINDINGS: Brain: No evidence of acute infarction, hemorrhage, hydrocephalus, extra-axial collection or mass lesion/mass effect. Vascular: No hyperdense vessel or unexpected calcification. Skull: Normal. Negative for fracture or focal lesion.  Sinuses/Orbits: Globes and orbits are within normal limits. Visualized sinuses are clear. Other: None. IMPRESSION: Normal unenhanced CT scan of the brain. Electronically Signed   By: Lajean Manes M.D.   On: 04/30/2021 13:32    Procedures Procedures   Medications Ordered in ED Medications  sodium chloride 0.9 % bolus 1,000 mL (0 mLs Intravenous Stopped 04/30/21 1521)  prochlorperazine (COMPAZINE) injection 10 mg (10 mg  Intravenous Given 04/30/21 1347)  diphenhydrAMINE (BENADRYL) injection 25 mg (25 mg Intravenous Given 04/30/21 1347)    ED Course  I have reviewed the triage vital signs and the nursing notes.  Pertinent labs & imaging results that were available during my care of the patient were reviewed by me and considered in my medical decision making (see chart for details).    MDM Rules/Calculators/A&P                          40 year old female presents with concern for headache.  CT head was completed given severity of headache shows no evidence of intracranial bleed or other abnormality.  Additionally, does not show signs of sinusitis.  Do not feel lumbar puncture to assess for occult hemorrhage is appropriate in this clinical scenario.  No fever, doubt meningitis.  Recently had cough, congestion on week ago and had a negative CT PCR test done on Thursday for COVID, however PCR testing today is positive for COVID-19.  Suspect this is likely etiology of her symptoms.  Recommend continued supportive care and quarantine. Headache improved with therapies in the ED. Patient discharged in stable condition with understanding of reasons to return.   Final Clinical Impression(s) / ED Diagnoses Final diagnoses:  Acute nonintractable headache, unspecified headache type  COVID-19    Rx / DC Orders ED Discharge Orders    None       Gareth Morgan, MD 05/01/21 1433

## 2021-05-01 ENCOUNTER — Telehealth: Payer: Self-pay

## 2021-05-01 NOTE — Telephone Encounter (Signed)
Called to discuss with patient about COVID-19 symptoms and the use of one of the available treatments for those with mild to moderate Covid symptoms and at a high risk of hospitalization.  Pt appears to qualify for outpatient treatment due to co-morbid conditions and/or a member of an at-risk group in accordance with the FDA Emergency Use Authorization.    Symptom onset: 04/26/21 headache,cough,congestion Vaccinated: No Booster? No Immunocompromised? No Qualifiers: None NIH Criteria: Tier 2  Declines further treatment.   Victoria Harvey

## 2021-09-04 IMAGING — CT CT HEAD W/O CM
4 series · 16 of 47 positions shown, 18 images · non-contrast
Comparison: None.

CLINICAL DATA: Headache.

EXAM:
CT HEAD WITHOUT CONTRAST
TECHNIQUE: Contiguous axial images were obtained from the base of the skull
through the vertex without intravenous contrast.

[Series 3: head without · axial · non-contrast · 0.43mm/px · z∈[-138,-18]mm · 7 of 33 slices shown, 9 images]
[im 5/33  brain]
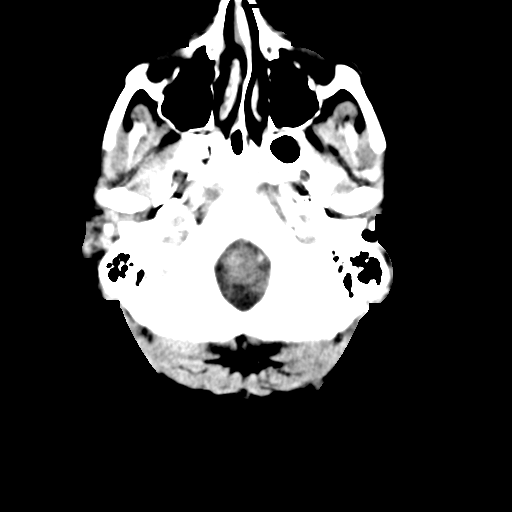
[im 5/33  bone]
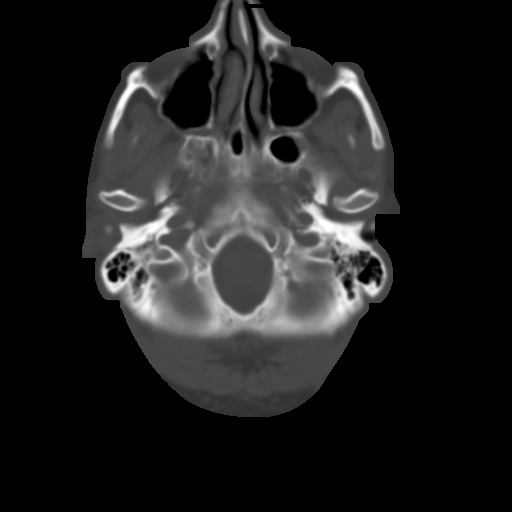
[im 9/33  brain]
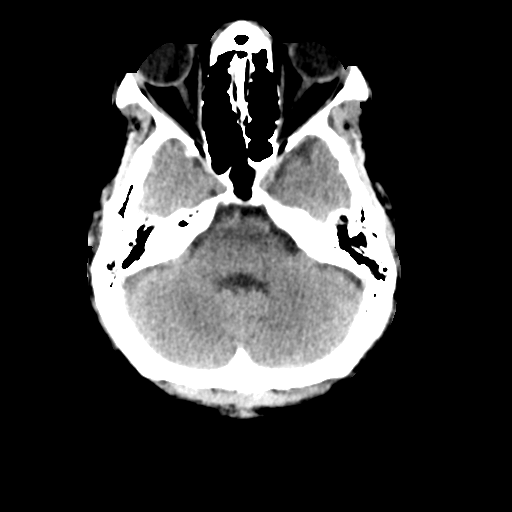
[im 13/33  brain]
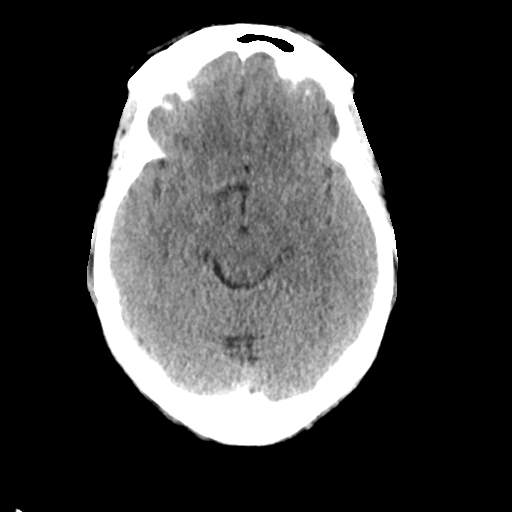
[im 17/33  brain]
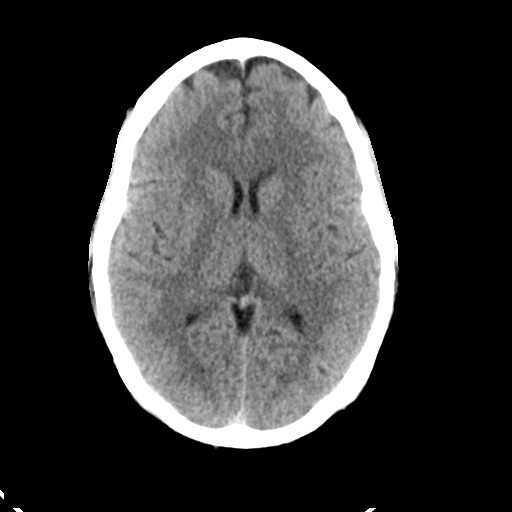
[im 21/33  brain]
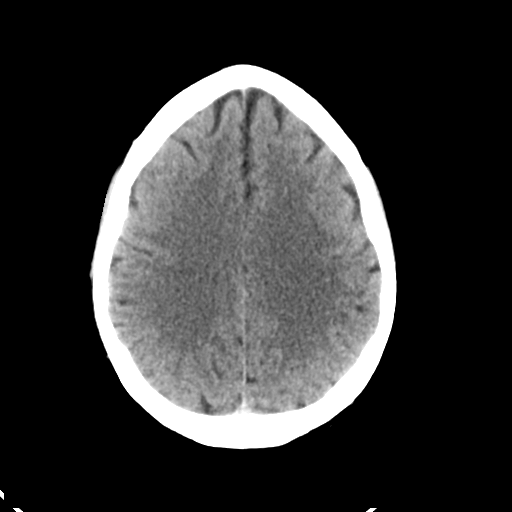
[im 21/33  bone]
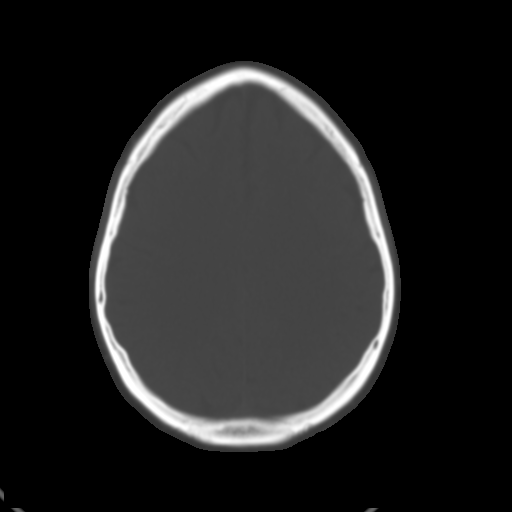
[im 25/33  brain]
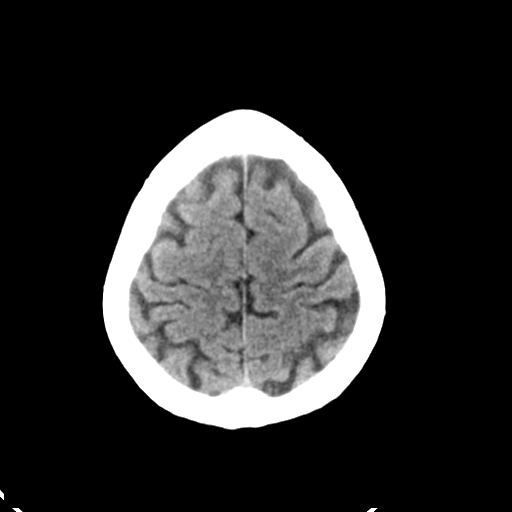
[im 29/33  brain]
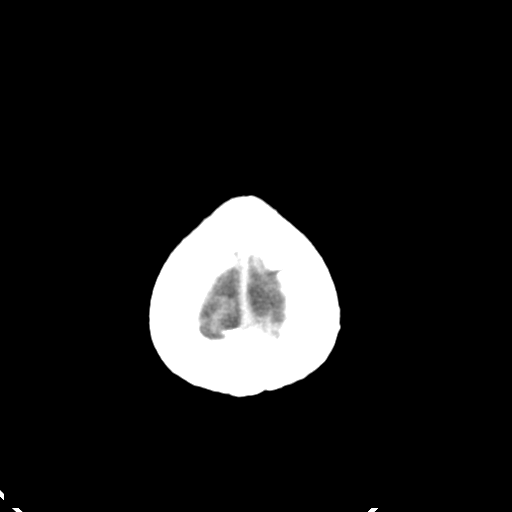

[Series 4: head bone · axial · 0.43mm/px · z∈[-142,-110]mm · 3 of 81 slices shown]
[im 9/81  bone]
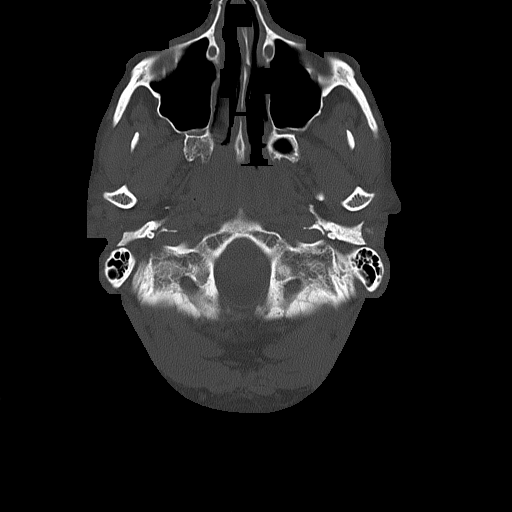
[im 17/81  bone]
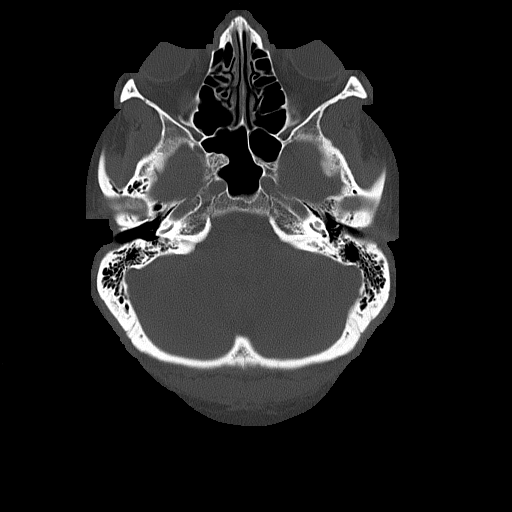
[im 25/81  bone]
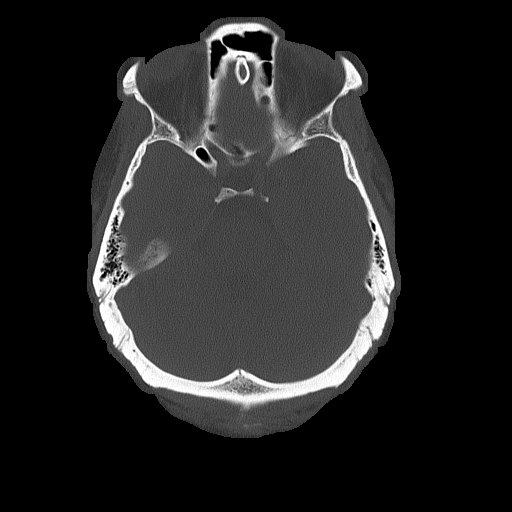

[Series 5: head without cor · coronal · non-contrast · 0.31mm/px · 3 of 67 slices shown]
[im 23/67  brain]
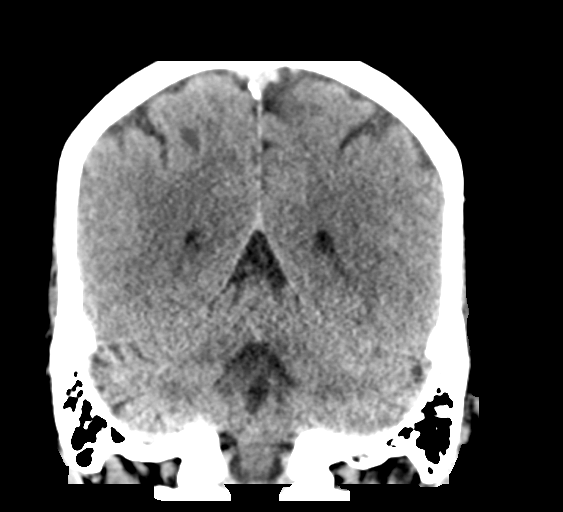
[im 30/67  brain]
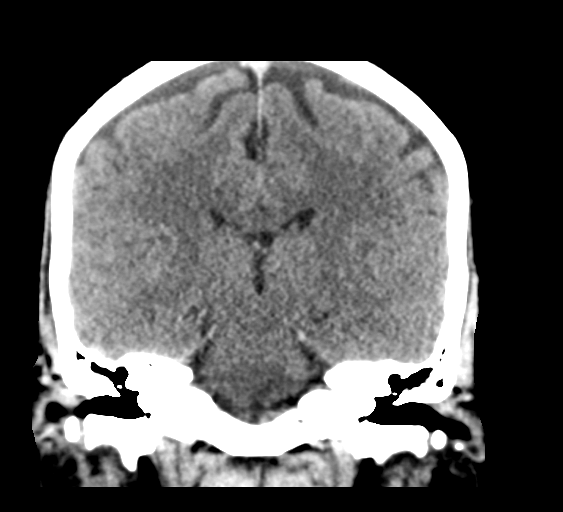
[im 37/67  brain]
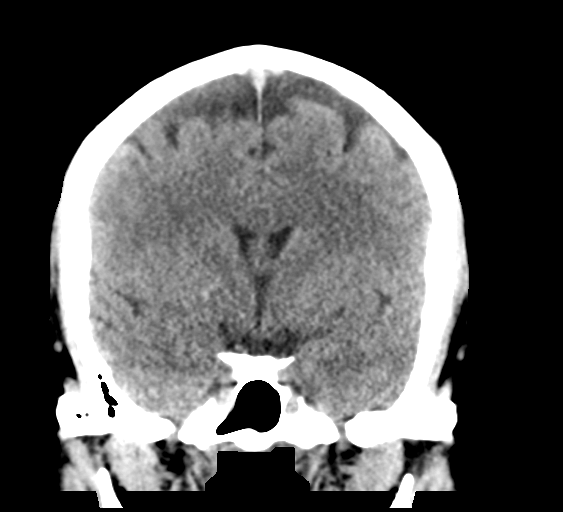

[Series 6: head without sag · sagittal · non-contrast · 0.31mm/px · 3 of 59 slices shown]
[im 20/59  brain]
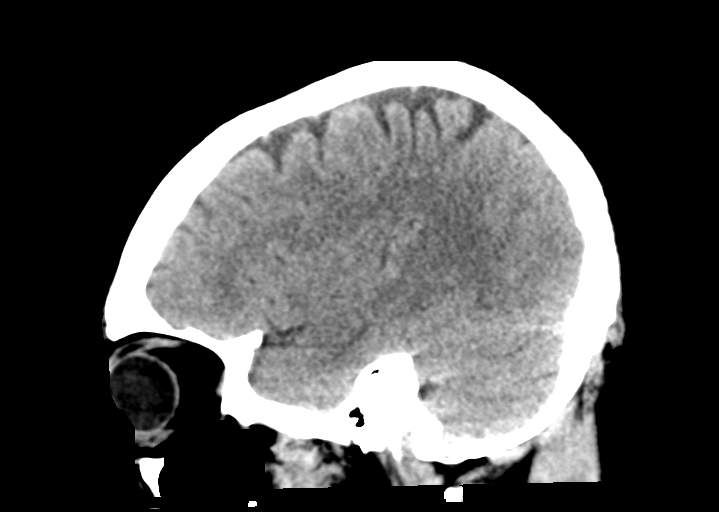
[im 30/59  brain]
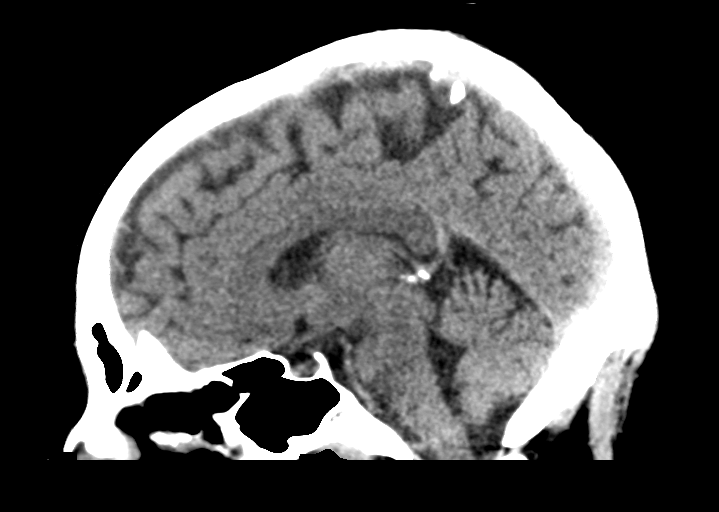
[im 39/59  brain]
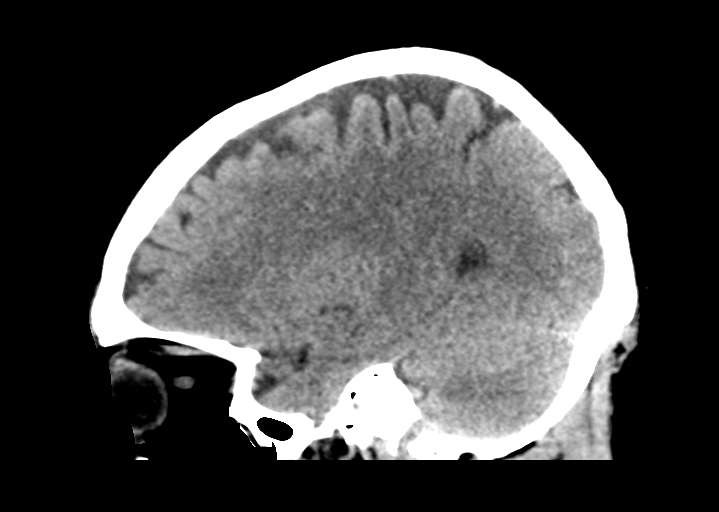

[16 of 47 positions shown; findings below may reference images not displayed]

FINDINGS: Brain: No evidence of acute infarction, hemorrhage, hydrocephalus,
extra-axial collection or mass lesion/mass effect.

Vascular: No hyperdense vessel or unexpected calcification.

Skull: Normal. Negative for fracture or focal lesion.

Sinuses/Orbits: Globes and orbits are within normal limits.
Visualized sinuses are clear.

Other: None.
IMPRESSION: Normal unenhanced CT scan of the brain.

## 2021-10-02 DIAGNOSIS — Z419 Encounter for procedure for purposes other than remedying health state, unspecified: Secondary | ICD-10-CM | POA: Diagnosis not present

## 2021-10-17 ENCOUNTER — Encounter: Payer: Self-pay | Admitting: Obstetrics and Gynecology

## 2021-10-31 DIAGNOSIS — Z124 Encounter for screening for malignant neoplasm of cervix: Secondary | ICD-10-CM | POA: Diagnosis not present

## 2021-10-31 DIAGNOSIS — Z6829 Body mass index (BMI) 29.0-29.9, adult: Secondary | ICD-10-CM | POA: Diagnosis not present

## 2021-10-31 DIAGNOSIS — E663 Overweight: Secondary | ICD-10-CM | POA: Diagnosis not present

## 2021-10-31 DIAGNOSIS — Z3009 Encounter for other general counseling and advice on contraception: Secondary | ICD-10-CM | POA: Diagnosis not present

## 2021-10-31 DIAGNOSIS — Z01419 Encounter for gynecological examination (general) (routine) without abnormal findings: Secondary | ICD-10-CM | POA: Diagnosis not present

## 2021-10-31 DIAGNOSIS — Z113 Encounter for screening for infections with a predominantly sexual mode of transmission: Secondary | ICD-10-CM | POA: Diagnosis not present

## 2021-10-31 DIAGNOSIS — Z0000 Encounter for general adult medical examination without abnormal findings: Secondary | ICD-10-CM | POA: Diagnosis not present

## 2021-11-01 DIAGNOSIS — Z419 Encounter for procedure for purposes other than remedying health state, unspecified: Secondary | ICD-10-CM | POA: Diagnosis not present

## 2021-11-18 ENCOUNTER — Encounter (HOSPITAL_BASED_OUTPATIENT_CLINIC_OR_DEPARTMENT_OTHER): Payer: Self-pay

## 2021-11-18 ENCOUNTER — Other Ambulatory Visit: Payer: Self-pay

## 2021-11-18 ENCOUNTER — Emergency Department (HOSPITAL_BASED_OUTPATIENT_CLINIC_OR_DEPARTMENT_OTHER)
Admission: EM | Admit: 2021-11-18 | Discharge: 2021-11-18 | Disposition: A | Discharge status: Home / Self Care | Payer: Medicaid Other | Attending: Emergency Medicine | Admitting: Emergency Medicine

## 2021-11-18 DIAGNOSIS — K2900 Acute gastritis without bleeding: Secondary | ICD-10-CM | POA: Diagnosis not present

## 2021-11-18 DIAGNOSIS — I10 Essential (primary) hypertension: Secondary | ICD-10-CM | POA: Insufficient documentation

## 2021-11-18 DIAGNOSIS — Z87891 Personal history of nicotine dependence: Secondary | ICD-10-CM | POA: Diagnosis not present

## 2021-11-18 DIAGNOSIS — R1010 Upper abdominal pain, unspecified: Secondary | ICD-10-CM | POA: Diagnosis present

## 2021-11-18 LAB — CBC WITH DIFFERENTIAL/PLATELET
Abs Immature Granulocytes: 0.02 10*3/uL (ref 0.00–0.07)
Basophils Absolute: 0 10*3/uL (ref 0.0–0.1)
Basophils Relative: 0 %
Eosinophils Absolute: 0.2 10*3/uL (ref 0.0–0.5)
Eosinophils Relative: 2 %
HCT: 41.3 % (ref 36.0–46.0)
Hemoglobin: 13.6 g/dL (ref 12.0–15.0)
Immature Granulocytes: 0 %
Lymphocytes Relative: 32 %
Lymphs Abs: 2.4 10*3/uL (ref 0.7–4.0)
MCH: 29.2 pg (ref 26.0–34.0)
MCHC: 32.9 g/dL (ref 30.0–36.0)
MCV: 88.8 fL (ref 80.0–100.0)
Monocytes Absolute: 0.8 10*3/uL (ref 0.1–1.0)
Monocytes Relative: 11 %
Neutro Abs: 4.2 10*3/uL (ref 1.7–7.7)
Neutrophils Relative %: 55 %
Platelets: 371 10*3/uL (ref 150–400)
RBC: 4.65 MIL/uL (ref 3.87–5.11)
RDW: 14.3 % (ref 11.5–15.5)
WBC: 7.6 10*3/uL (ref 4.0–10.5)
nRBC: 0 % (ref 0.0–0.2)

## 2021-11-18 LAB — COMPREHENSIVE METABOLIC PANEL
ALT: 13 U/L (ref 0–44)
AST: 16 U/L (ref 15–41)
Albumin: 4.4 g/dL (ref 3.5–5.0)
Alkaline Phosphatase: 58 U/L (ref 38–126)
Anion gap: 9 (ref 5–15)
BUN: 12 mg/dL (ref 6–20)
CO2: 25 mmol/L (ref 22–32)
Calcium: 9.9 mg/dL (ref 8.9–10.3)
Chloride: 101 mmol/L (ref 98–111)
Creatinine, Ser: 0.72 mg/dL (ref 0.44–1.00)
GFR, Estimated: >60 mL/min (ref >60)
Glucose, Bld: 77 mg/dL (ref 70–99)
Potassium: 4.2 mmol/L (ref 3.5–5.1)
Sodium: 135 mmol/L (ref 135–145)
Total Bilirubin: 0.9 mg/dL (ref 0.3–1.2)
Total Protein: 8 g/dL (ref 6.5–8.1)

## 2021-11-18 LAB — URINALYSIS, ROUTINE W REFLEX MICROSCOPIC
Bilirubin Urine: NEGATIVE
Glucose, UA: NEGATIVE mg/dL
Ketones, ur: 40 mg/dL — AB
Leukocytes,Ua: NEGATIVE
Nitrite: NEGATIVE
Specific Gravity, Urine: 1.029 (ref 1.005–1.030)
pH: 6 (ref 5.0–8.0)

## 2021-11-18 LAB — LIPASE, BLOOD: Lipase: <10 U/L — ABNORMAL LOW (ref 11–51)

## 2021-11-18 LAB — PREGNANCY, URINE: Preg Test, Ur: NEGATIVE

## 2021-11-18 MED ORDER — LIDOCAINE VISCOUS HCL 2 % MT SOLN
15.0000 mL | Freq: Once | OROMUCOSAL | Status: AC
  Administered 2021-11-18: 19:00:00 15 mL via ORAL
  Filled 2021-11-18: qty 15

## 2021-11-18 MED ORDER — PANTOPRAZOLE SODIUM 20 MG PO TBEC: 20.0000 mg | DELAYED_RELEASE_TABLET | Freq: Every day | ORAL | 0 refills | Status: DC

## 2021-11-18 MED ORDER — ALUM & MAG HYDROXIDE-SIMETH 200-200-20 MG/5ML PO SUSP
30.0000 mL | Freq: Once | ORAL | Status: AC
  Administered 2021-11-18: 19:00:00 30 mL via ORAL
  Filled 2021-11-18: qty 30

## 2021-11-18 NOTE — ED Triage Notes (Signed)
Pt states she drank a lot of ETOH last PM today she is having epigastric "burning" all day. Pt denies N/V/D. Pt has tried OTC acid medications without relief.

## 2021-11-18 NOTE — ED Provider Notes (Signed)
[facility] Provider Note  CSN: [id number] Arrival date & time: 11/18/21 1832    History Chief Complaint  Patient presents with   Abdominal Pain    Victoria Harvey is a 40 y.o. female reports she was drinking some alcohol last night, woke up today with some burning in her upper abdomen and some heart burn. She denies any sharp pain, nothing radiating into her back. No fever or vomiting. She took some Pepcid, Tums and Pepto. The heart burn stopped but the abdominal burning has been persistent. No prior history of same.    Past Medical History:  Diagnosis Date   Hypertension    Pinched nerve    in back; recently completed course of meds for sxs   Pneumonia    Strep sore throat     Past Surgical History:  Procedure Laterality Date   NO PAST SURGERIES     WISDOM TOOTH EXTRACTION      Family History  Problem Relation Age of Onset   Hypertension Mother    Hypertension Father    Cancer Maternal Grandmother        breast   Stroke Paternal Grandmother     Social History   Tobacco Use   Smoking status: Former    Packs/day: 0.00    Types: Cigarettes    Quit date: 11/24/2014    Years since quitting: 6.9   Smokeless tobacco: Never  Vaping Use   Vaping Use: Never used  Substance Use Topics   Alcohol use: Yes   Drug use: No     Home Medications Prior to Admission medications   Medication Sig Start Date End Date Taking? Authorizing Provider  pantoprazole (PROTONIX) 20 MG tablet Take 1 tablet (20 mg total) by mouth daily. 11/18/21  Yes Truddie Hidden, MD  acetaminophen (TYLENOL) 325 MG tablet Take 650 mg by mouth every 6 (six) hours as needed.    [provider]  naproxen (NAPROSYN) 500 MG tablet Take 1 tablet (500 mg total) by mouth 2 (two) times daily. 02/02/21   Zigmund Gottron, NP     Allergies    Levofloxacin   Review of Systems   Review of Systems A comprehensive review of systems was completed and negative  except as noted in HPI.    Physical Exam BP (!) 162/99 (BP Location: Right Arm)    Pulse 93    Temp 98.6 F (37 C) (Oral)    Resp 17    Ht 5\' 7"  (1.702 m)    Wt 81.6 kg    LMP 11/04/2021 (Exact Date)    SpO2 99%    BMI 28.19 kg/m   Physical Exam Vitals and nursing note reviewed.  Constitutional:      Appearance: Normal appearance.  HENT:     Head: Normocephalic and atraumatic.     Nose: Nose normal.     Mouth/Throat:     Mouth: Mucous membranes are moist.  Eyes:     Extraocular Movements: Extraocular movements intact.     Conjunctiva/sclera: Conjunctivae normal.  Cardiovascular:     Rate and Rhythm: Normal rate.  Pulmonary:     Effort: Pulmonary effort is normal.     Breath sounds: Normal breath sounds.  Abdominal:     General: Abdomen is flat.     Palpations: Abdomen is soft.     Tenderness: There is no abdominal tenderness. There is no guarding. Negative signs include Murphy's sign and McBurney's sign.  Musculoskeletal:  General: No swelling. Normal range of motion.     Cervical back: Neck supple.  Skin:    General: Skin is warm and dry.  Neurological:     General: No focal deficit present.     Mental Status: She is alert.  Psychiatric:        Mood and Affect: Mood normal.     ED Results / Procedures / Treatments   Labs (all labs ordered are listed, but only abnormal results are displayed) Labs Reviewed  LIPASE, BLOOD - Abnormal; Notable for the following components:      Result Value   Lipase <10 (*)    All other components within normal limits  URINALYSIS, ROUTINE W REFLEX MICROSCOPIC - Abnormal; Notable for the following components:   Hgb urine dipstick MODERATE (*)    Ketones, ur 40 (*)    Protein, ur TRACE (*)    All other components within normal limits  COMPREHENSIVE METABOLIC PANEL  CBC WITH DIFFERENTIAL/PLATELET  PREGNANCY, URINE    EKG None   Radiology No results found.  Procedures Procedures  Medications Ordered in the  ED Medications  alum & mag hydroxide-simeth (MAALOX/MYLANTA) 200-200-20 MG/5ML suspension 30 mL (30 mLs Oral Given 11/18/21 1919)    And  lidocaine (XYLOCAINE) 2 % viscous mouth solution 15 mL (15 mLs Oral Given 11/18/21 1919)     MDM Rules/Calculators/A&P MDM Patient with symptoms most consistent with a gastritis. No tenderness on exam. Will try a GI cocktail first. If not effective may require more extensive workup. Patient amenable to this plan.   ED Course  I have reviewed the triage vital signs and the nursing notes.  Pertinent labs & imaging results that were available during my care of the patient were reviewed by me and considered in my medical decision making (see chart for details).  Clinical Course as of 11/18/21 2226  Sun Nov 18, 2021  2019 No change with GI cocktail. Will check labs to eval liver, gall bladder, pancreas, etc.  [CS]  2057 CBC is normal.  [CS]  2122 CMP and lipase are normal.  [CS]  2153 Labs are unremarkable. Patient will be discharged with Rx for protonix, advised to avoid EtOH, NSAIDs or other irritants. GI referral if not improving.  [CS]    Clinical Course User Index [CS] Truddie Hidden, MD    Final Clinical Impression(s) / ED Diagnoses Final diagnoses:  Acute gastritis without hemorrhage, unspecified gastritis type    Rx / DC Orders ED Discharge Orders          Ordered    pantoprazole (PROTONIX) 20 MG tablet  Daily        11/18/21 2155             Truddie Hidden, MD 11/18/21 2226

## 2021-11-19 ENCOUNTER — Telehealth: Payer: Self-pay

## 2021-11-19 DIAGNOSIS — R1030 Lower abdominal pain, unspecified: Secondary | ICD-10-CM | POA: Diagnosis not present

## 2021-11-19 DIAGNOSIS — R110 Nausea: Secondary | ICD-10-CM | POA: Diagnosis not present

## 2021-11-19 NOTE — Telephone Encounter (Signed)
Transition Care Management Follow-up Telephone Call Date of discharge and from where: 11/18/2021-[facility] How have you been since you were released from the hospital? Patient stated she is not feeling better. Her pharmacy didn't have her medication in stock. Patient was advised to call local pharmacies to see who had the medication and the pharmacy could request a transfer.  Any questions or concerns? No  Items Reviewed: Did the pt receive and understand the discharge instructions provided? Yes  Medications obtained and verified? Yes  Other? No  Any new allergies since your discharge? No  Dietary orders reviewed? No Do you have support at home? Yes   Home Care and Equipment/Supplies: Were home health services ordered? not applicable If so, what is the name of the agency? N/A  Has the agency set up a time to come to the patient's home? not applicable Were any new equipment or medical supplies ordered?  No What is the name of the medical supply agency? N/A Were you able to get the supplies/equipment? not applicable Do you have any questions related to the use of the equipment or supplies? No  Functional Questionnaire: (I = Independent and D = Dependent) ADLs: I  Bathing/Dressing- I  Meal Prep- I  Eating- I  Maintaining continence- I  Transferring/Ambulation- I  Managing Meds- I  Follow up appointments reviewed:  PCP Hospital f/u appt confirmed? No   Specialist Hospital f/u appt confirmed? No   Are transportation arrangements needed? No  If their condition worsens, is the pt aware to call PCP or go to the Emergency Dept.? Yes Was the patient provided with contact information for the PCP's office or ED? Yes Was to pt encouraged to call back with questions or concerns? Yes

## 2021-11-20 DIAGNOSIS — G4700 Insomnia, unspecified: Secondary | ICD-10-CM | POA: Diagnosis not present

## 2021-11-20 DIAGNOSIS — R1010 Upper abdominal pain, unspecified: Secondary | ICD-10-CM | POA: Diagnosis not present

## 2021-12-02 DIAGNOSIS — Z419 Encounter for procedure for purposes other than remedying health state, unspecified: Secondary | ICD-10-CM | POA: Diagnosis not present

## 2021-12-13 DIAGNOSIS — Z3009 Encounter for other general counseling and advice on contraception: Secondary | ICD-10-CM | POA: Diagnosis not present

## 2021-12-24 ENCOUNTER — Other Ambulatory Visit: Payer: Self-pay | Admitting: Obstetrics & Gynecology

## 2022-01-02 DIAGNOSIS — Z419 Encounter for procedure for purposes other than remedying health state, unspecified: Secondary | ICD-10-CM | POA: Diagnosis not present

## 2022-01-11 DIAGNOSIS — Z302 Encounter for sterilization: Secondary | ICD-10-CM | POA: Diagnosis not present

## 2022-01-11 DIAGNOSIS — Z3046 Encounter for surveillance of implantable subdermal contraceptive: Secondary | ICD-10-CM | POA: Diagnosis not present

## 2022-01-13 DIAGNOSIS — L089 Local infection of the skin and subcutaneous tissue, unspecified: Secondary | ICD-10-CM | POA: Diagnosis not present

## 2022-01-13 DIAGNOSIS — S40022A Contusion of left upper arm, initial encounter: Secondary | ICD-10-CM | POA: Diagnosis not present

## 2022-01-13 DIAGNOSIS — X58XXXA Exposure to other specified factors, initial encounter: Secondary | ICD-10-CM | POA: Diagnosis not present

## 2022-01-15 NOTE — Progress Notes (Signed)
Surgical Instructions    Your procedure is scheduled on Monday, February 20th.  Report to [facility] Main Entrance "A" at 7:00 A.M., then check in with the Admitting office.  Call this number if you have problems the morning of surgery:  [phone]   If you have any questions prior to your surgery date call [phone]: Open Monday-Friday 8am-4pm    Remember:  Do not eat or drink after midnight the night before your surgery     Take these medicines the morning of surgery with A SIP OF WATER:   NONE   As of today, STOP taking any Aspirin (unless otherwise instructed by your surgeon) Aleve, Naproxen, Ibuprofen, Motrin, Advil, Goody's, BC's, all herbal medications, fish oil, and all vitamins.            DAY OF SURGERY: Do not wear jewelry or makeup Do not wear lotions, powders, perfumes, or deodorant. Do not shave 48 hours prior to surgery.   Do not bring valuables to the hospital. Do not wear nail polish, gel polish, artificial nails, or any other type of covering on natural nails (fingers and toes) If you have artificial nails or gel coating that need to be removed by a nail salon, please have this removed prior to surgery. Artificial nails or gel coating may interfere with anesthesia's ability to adequately monitor your vital signs.  [facility] is not responsible for any belongings or valuables. .   Do NOT Smoke (Tobacco/Vaping)  24 hours prior to your procedure  If you use a CPAP at night, you may bring your mask for your overnight stay.   Contacts, glasses, hearing aids, dentures or partials may not be worn into surgery, please bring cases for these belongings   For patients admitted to the hospital, discharge time will be determined by your treatment team.   Patients discharged the day of surgery will not be allowed to drive home, and someone needs to stay with them for 24 hours.  NO VISITORS WILL BE ALLOWED IN PRE-OP WHERE PATIENTS ARE PREPPED FOR SURGERY.  ONLY 1  SUPPORT PERSON MAY BE PRESENT IN THE WAITING ROOM WHILE YOU ARE IN SURGERY.  IF YOU ARE TO BE ADMITTED, ONCE YOU ARE IN YOUR ROOM YOU WILL BE ALLOWED TWO (2) VISITORS. 1 (ONE) VISITOR MAY STAY OVERNIGHT BUT MUST ARRIVE TO THE ROOM BY 8pm.  Minor children may have two parents present. Special consideration for safety and communication needs will be reviewed on a case by case basis.  Special instructions:    Oral Hygiene is also important to reduce your risk of infection.  Remember - BRUSH YOUR TEETH THE MORNING OF SURGERY WITH YOUR REGULAR TOOTHPASTE   [facility]- Preparing For Surgery  Before surgery, you can play an important role. Because skin is not sterile, your skin needs to be as free of germs as possible. You can reduce the number of germs on your skin by washing with CHG (chlorahexidine gluconate) Soap before surgery.  CHG is an antiseptic cleaner which kills germs and bonds with the skin to continue killing germs even after washing.     Please do not use if you have an allergy to CHG or antibacterial soaps. If your skin becomes reddened/irritated stop using the CHG.  Do not shave (including legs and underarms) for at least 48 hours prior to first CHG shower. It is OK to shave your face.  Please follow these instructions carefully.     Shower the [employer] SURGERY and  the MORNING OF SURGERY with CHG Soap.   If you chose to wash your hair, wash your hair first as usual with your normal shampoo. After you shampoo, rinse your hair and body thoroughly to remove the shampoo.  Then [employer] and genitals (private parts) with your normal soap and rinse thoroughly to remove soap.  After that Use CHG Soap as you would any other liquid soap. You can apply CHG directly to the skin and wash gently with a scrungie or a clean washcloth.   Apply the CHG Soap to your body ONLY FROM THE NECK DOWN.  Do not use on open wounds or open sores. Avoid contact with your eyes, ears, mouth and genitals  (private parts). Wash Face and genitals (private parts)  with your normal soap.   Wash thoroughly, paying special attention to the area where your surgery will be performed.  Thoroughly rinse your body with warm water from the neck down.  DO NOT shower/wash with your normal soap after using and rinsing off the CHG Soap.  Pat yourself dry with a CLEAN TOWEL.  Wear CLEAN PAJAMAS to bed the night before surgery  Place CLEAN SHEETS on your bed the night before your surgery  DO NOT SLEEP WITH PETS.   Day of Surgery:  Take a shower with CHG soap. Wear Clean/Comfortable clothing the morning of surgery Do not apply any deodorants/lotions.   Remember to brush your teeth WITH YOUR REGULAR TOOTHPASTE.    Please read over the following fact sheets that you were given.

## 2022-01-16 ENCOUNTER — Encounter (HOSPITAL_COMMUNITY): Payer: Self-pay

## 2022-01-16 ENCOUNTER — Other Ambulatory Visit: Payer: Self-pay

## 2022-01-16 ENCOUNTER — Encounter (HOSPITAL_COMMUNITY)
Admission: RE | Admit: 2022-01-16 | Discharge: 2022-01-16 | Disposition: A | Discharge status: Home / Self Care | Payer: Medicaid Other | Source: Ambulatory Visit | Attending: Obstetrics & Gynecology | Admitting: Obstetrics & Gynecology

## 2022-01-16 VITALS — BP 151/80 | HR 94 | Temp 98.9°F | Resp 17 | Ht 67.0 in | Wt 191.6 lb

## 2022-01-16 DIAGNOSIS — Z87891 Personal history of nicotine dependence: Secondary | ICD-10-CM | POA: Insufficient documentation

## 2022-01-16 DIAGNOSIS — I10 Essential (primary) hypertension: Secondary | ICD-10-CM | POA: Diagnosis not present

## 2022-01-16 DIAGNOSIS — E663 Overweight: Secondary | ICD-10-CM | POA: Diagnosis not present

## 2022-01-16 DIAGNOSIS — Z01818 Encounter for other preprocedural examination: Secondary | ICD-10-CM | POA: Diagnosis not present

## 2022-01-16 LAB — CBC
HCT: 40.3 % (ref 36.0–46.0)
Hemoglobin: 12.9 g/dL (ref 12.0–15.0)
MCH: 29.3 pg (ref 26.0–34.0)
MCHC: 32 g/dL (ref 30.0–36.0)
MCV: 91.4 fL (ref 80.0–100.0)
Platelets: 355 10*3/uL (ref 150–400)
RBC: 4.41 MIL/uL (ref 3.87–5.11)
RDW: 13.7 % (ref 11.5–15.5)
WBC: 5.8 10*3/uL (ref 4.0–10.5)
nRBC: 0 % (ref 0.0–0.2)

## 2022-01-16 LAB — SURGICAL PCR SCREEN
MRSA, PCR: NEGATIVE
Staphylococcus aureus: NEGATIVE

## 2022-01-16 LAB — TYPE AND SCREEN
ABO/RH(D): O POS
Antibody Screen: NEGATIVE

## 2022-01-16 LAB — BASIC METABOLIC PANEL
Anion gap: 8 (ref 5–15)
BUN: 16 mg/dL (ref 6–20)
CO2: 23 mmol/L (ref 22–32)
Calcium: 8.8 mg/dL — ABNORMAL LOW (ref 8.9–10.3)
Chloride: 103 mmol/L (ref 98–111)
Creatinine, Ser: 0.77 mg/dL (ref 0.44–1.00)
GFR, Estimated: >60 mL/min (ref >60)
Glucose, Bld: 78 mg/dL (ref 70–99)
Potassium: 4 mmol/L (ref 3.5–5.1)
Sodium: 134 mmol/L — ABNORMAL LOW (ref 135–145)

## 2022-01-16 NOTE — H&P (Addendum)
Victoria Harvey is an 41 y.o. female Para 2 here for a laparoscopic removal of both fallopian tubes for sterilization.  Patient does not desire future fertility and desires to proceed with the procedure.    Pertinent Gynecological History: Menses:  with regular monthly periods.  Bleeding: None Contraception: none DES exposure: unknown Blood transfusions: none Sexually transmitted diseases: no past history Previous GYN Procedures:  None   Last mammogram: normal Date: 10/2021 Last pap: normal Date: 10/31/21 OB History: G2, P2002   Menstrual History: LMP: 12/28/2021.     Past Medical History:  Diagnosis Date   Hypertension    Pinched nerve    in back; recently completed course of meds for sxs   Pneumonia    Strep sore throat     Past Surgical History:  Procedure Laterality Date   FRACTURE SURGERY Right    arm   NO PAST SURGERIES     WISDOM TOOTH EXTRACTION      Family History  Problem Relation Age of Onset   Hypertension Mother    Hypertension Father    Cancer Maternal Grandmother        breast   Stroke Paternal Grandmother     Social History:  reports that she quit smoking about 7 years ago. Her smoking use included cigarettes. She has never used smokeless tobacco. She reports current alcohol use. She reports that she does not use drugs.  Allergies:  Allergies  Allergen Reactions   Levofloxacin Nausea And Vomiting    No medications prior to admission.    Review of Systems ROS  Constitutional: Denies fevers/chills Cardiovascular: Denies chest pain or palpitations Pulmonary: Denies coughing or wheezing Gastrointestinal: Denies nausea, vomiting or diarrhea Genitourinary: Denies pelvic pain, unusual vaginal bleeding, unusual vaginal discharge, dysuria, urgency or frequency.  Musculoskeletal: Denies muscle or joint aches and pain.  Neurology: Denies abnormal sensations such as tingling or numbness.    At office: Ht 30ft 7 inches. Weight: 192 lbs. BMI 30.   Physical Exam  Blood pressure (!) 185/89, pulse 99, temperature 99 F (37.2 C), temperature source Oral, resp. rate 17, height 5\' 7"  (1.702 m), weight 85.7 kg, SpO2 100 %.  Vitals:   01/21/22 0722  BP: (!) 185/89  Pulse: 99  Resp: 17  Temp: 99 F (37.2 C)  TempSrc: Oral  SpO2: 100%  Weight: 85.7 kg  Height: 5\' 7"  (1.702 m)    Constitutional: She is oriented to person, place, and time. She appears well-developed and well-nourished.  HENT:  Head: Normocephalic and atraumatic.  Neck: Normal range of motion.  Cardiovascular: Normal rate, regular rhythm and normal heart sounds.   Respiratory: Effort normal and breath sounds normal.  GI: Soft. Bowel sounds are normal.  Neurological: She is alert and oriented to person, place, and time.  Skin: Skin is warm and dry.  Psychiatric: She has a normal mood and affect. Her behavior is normal.   Results for orders placed or performed during the hospital encounter of 01/16/22 (from the past 24 hour(s))  CBC     Status: None   Collection Time: 01/16/22  8:39 AM  Result Value Ref Range   WBC 5.8 4.0 - 10.5 K/uL   RBC 4.41 3.87 - 5.11 MIL/uL   Hemoglobin 12.9 12.0 - 15.0 g/dL   HCT 40.3 36.0 - 46.0 %   MCV 91.4 80.0 - 100.0 fL   MCH 29.3 26.0 - 34.0 pg   MCHC 32.0 30.0 - 36.0 g/dL   RDW 13.7 11.5 -  15.5 %   Platelets 355 150 - 400 K/uL   nRBC 0.0 0.0 - 0.2 %  Basic metabolic panel per protocol     Status: Abnormal   Collection Time: 01/16/22  8:39 AM  Result Value Ref Range   Sodium 134 (L) 135 - 145 mmol/L   Potassium 4.0 3.5 - 5.1 mmol/L   Chloride 103 98 - 111 mmol/L   CO2 23 22 - 32 mmol/L   Glucose, Bld 78 70 - 99 mg/dL   BUN 16 6 - 20 mg/dL   Creatinine, Ser 0.77 0.44 - 1.00 mg/dL   Calcium 8.8 (L) 8.9 - 10.3 mg/dL   GFR, Estimated >60 >60 mL/min   Anion gap 8 5 - 15  Type and screen     Status: None   Collection Time: 01/16/22  9:12 AM  Result Value Ref Range   ABO/RH(D) O POS    Antibody Screen NEG    Sample Expiration  01/30/2022,2359    Extend sample reason      NO TRANSFUSIONS OR PREGNANCY IN THE PAST 3 MONTHS Performed at [facility], [place] [address] [place], [place] [address]    01/21/22: Urine pregnancy test: Negative.  Assessment/Plan: 41 y/o Para 2 here for a laparoscopic removal of both fallopian tubes for sterilization.  -This procedure has been fully reviewed with the patient and written informed consent has been obtained.  -We discussed risks, benefits and alternatives of laparoscopic complete removal of both tubes including but not limited to risks of bleeding, infection, damage to organs.  She also understood the risk of tubal regret but she states she is 100% sure she did not want any more children.  We discussed that complete removal of tubes would ensure sterilization and may also decrease her future risk of ovarian and fallopian tube cancer.  She understood there were other kinds of birth control such as pills, patches, IUDs, vaginal rings and depo provera which were temporary but she did not desire them.  She understood there was also an option of female sterilization but she did not desire that option either.  She did not desire tubal fulguration or placement of tubal clips.  All her questions were answered and she was consented for the procedure.  She was advised to continue with condom use until the procedure was performed.  Medicaid sterilization consent form was signed in office on 12/13/2021.   She expressed understanding that if she does have irregular or heavy bleeding after the sterilization she may require hormone control in the form of birth control devices or medication to regulate her cycles.    - Admit to Same Day surgery. - NPO  - IV fluids   Archie Endo, MD.  01/16/2022, 11:32 AM

## 2022-01-16 NOTE — Progress Notes (Signed)
PCP - Dr. Delice Lesch, [place] at [facility] Cardiologist - patient denies   PPM/ICD - n/a Device Orders -  Rep Notified -   Chest x-ray - n/a EKG - 01/16/22 Stress Test - patient denies ECHO - patient denies Cardiac Cath - patient denies  Sleep Study - patient denies, negative stop bang CPAP -   Fasting Blood Sugar - n/a Checks Blood Sugar _____ times a day  Blood Thinner Instructions: n/a Aspirin Instructions: n/a  ERAS Protcol - NPO orders PRE-SURGERY Ensure or G2-  n/a   COVID TEST- ambulatory surgery   Anesthesia review: yes, abnormal EKG at PAT  Patient denies shortness of breath, fever, cough and chest pain at PAT appointment   All instructions explained to the patient, with a verbal understanding of the material. Patient agrees to go over the instructions while at home for a better understanding. Patient also instructed to self quarantine after being tested for COVID-19. The opportunity to ask questions was provided.

## 2022-01-17 NOTE — Anesthesia Preprocedure Evaluation (Addendum)
Anesthesia Evaluation  Patient identified by MRN, date of birth, ID band Patient awake    Reviewed: Allergy & Precautions, H&P , NPO status , Patient's Chart, lab work & pertinent test results  Airway Mallampati: I  TM Distance: >3 FB Neck ROM: Full    Dental no notable dental hx. (+) Poor Dentition, Chipped, Missing, Dental Advisory Given,    Pulmonary neg pulmonary ROS, former smoker,    Pulmonary exam normal breath sounds clear to auscultation       Cardiovascular Exercise Tolerance: Good hypertension, Normal cardiovascular exam Rhythm:Regular Rate:Normal  EKG: 01/16/22: Normal sinus rhythm Cannot rule out Anterior infarct , age undetermined Abnormal ECG When compared with ECG of 06-Dec-2016 02:55, PREVIOUS ECG IS PRESENT No significant change since last tracing Confirmed by Quay Burow [phone]) on 01/16/2022 1:11:48 PM   Neuro/Psych negative neurological ROS  negative psych ROS   GI/Hepatic negative GI ROS, Neg liver ROS,   Endo/Other  negative endocrine ROS  Renal/GU negative Renal ROS  negative genitourinary   Musculoskeletal negative musculoskeletal ROS (+)   Abdominal   Peds negative pediatric ROS (+)  Hematology negative hematology ROS (+)   Anesthesia Other Findings   Reproductive/Obstetrics negative OB ROS                           Anesthesia Physical Anesthesia Plan  ASA: 2  Anesthesia Plan: General   Post-op Pain Management:    Induction: Intravenous  PONV Risk Score and Plan:   Airway Management Planned: LMA  Additional Equipment:   Intra-op Plan:   Post-operative Plan:   Informed Consent: I have reviewed the patients History and Physical, chart, labs and discussed the procedure including the risks, benefits and alternatives for the proposed anesthesia with the patient or authorized representative who has indicated his/her understanding and acceptance.        Plan Discussed with: CRNA and Anesthesiologist  Anesthesia Plan Comments: (PAT note written 01/17/2022 by Myra Gianotti, PA-C. )      Anesthesia Quick Evaluation

## 2022-01-17 NOTE — Progress Notes (Signed)
Anesthesia Chart Review:  Case: [id number] Date/Time: 01/21/22 0845   Procedure: LAPAROSCOPIC TUBAL LIGATION WITH FULGURATION OF OVIDUCTS (Bilateral)   Anesthesia type: Choice   Pre-op diagnosis: ENCOUNTER FOR STERILIZATION   Location: [facility] OR ROOM 08 / [place] [facility]   Surgeons: Waymon Amato, MD       DISCUSSION: Patient is a 41 year old female scheduled for the above procedure.  History includes former smoker (quit 11/24/14), HTN.   She is for urine pregnancy test on the day of surgery.  Anesthesia team to evaluate on the day of surgery.  VS: BP (!) 151/80    Pulse 94    Temp 37.2 C    Resp 17    Ht 5\' 7"  (1.702 m)    Wt 86.9 kg    SpO2 100%    BMI 30.01 kg/m    PROVIDERS: Dortha Kern, PA-C is PCP. Newly established 11/20/21. ([place] at [facility])   LABS: Labs reviewed: Acceptable for surgery. (all labs ordered are listed, but only abnormal results are displayed)  Labs Reviewed  BASIC METABOLIC PANEL - Abnormal; Notable for the following components:      Result Value   Sodium 134 (*)    Calcium 8.8 (*)    All other components within normal limits  SURGICAL PCR SCREEN  CBC  TYPE AND SCREEN    IMAGES: Xray Abd 1V 11/19/21 ([facility]): Impression: 1. No acute abnormality is evident.  2. Calcified uterine fibroid is likely.    EKG: 01/16/22: Normal sinus rhythm Cannot rule out Anterior infarct , age undetermined Abnormal ECG When compared with ECG of 06-Dec-2016 02:55, PREVIOUS ECG IS PRESENT No significant change since last tracing Confirmed by Quay Burow [phone]) on 01/16/2022 1:11:48 PM   CV: N/A   Past Medical History:  Diagnosis Date   Hypertension    Pinched nerve    in back; recently completed course of meds for sxs   Pneumonia    Strep sore throat     Past Surgical History:  Procedure Laterality Date   FRACTURE SURGERY Right    arm   NO PAST SURGERIES     WISDOM TOOTH EXTRACTION      MEDICATIONS:  doxycycline  (VIBRAMYCIN) 100 MG capsule   aspirin-acetaminophen-caffeine (EXCEDRIN MIGRAINE) 250-250-65 MG tablet   Multiple Vitamin (MULTIVITAMIN WITH MINERALS) TABS tablet   pantoprazole (PROTONIX) 20 MG tablet   No current facility-administered medications for this encounter.  By current list, she is not taking Protonix.   Myra Gianotti, PA-C Surgical Short Stay/Anesthesiology [facility] Phone [phone] [facility] Phone [phone] 01/17/2022 5:05 PM

## 2022-01-21 ENCOUNTER — Ambulatory Visit (HOSPITAL_COMMUNITY)
Admission: RE | Admit: 2022-01-21 | Discharge: 2022-01-21 | Disposition: A | Discharge status: Home / Self Care | Payer: Medicaid Other | Attending: Obstetrics & Gynecology | Admitting: Obstetrics & Gynecology

## 2022-01-21 ENCOUNTER — Ambulatory Visit (HOSPITAL_BASED_OUTPATIENT_CLINIC_OR_DEPARTMENT_OTHER): Payer: Medicaid Other | Admitting: Anesthesiology

## 2022-01-21 ENCOUNTER — Encounter (HOSPITAL_COMMUNITY): Payer: Self-pay | Admitting: Obstetrics & Gynecology

## 2022-01-21 ENCOUNTER — Other Ambulatory Visit: Payer: Self-pay

## 2022-01-21 ENCOUNTER — Encounter (HOSPITAL_COMMUNITY)
Admission: RE | Disposition: A | Discharge status: Home / Self Care | Payer: Self-pay | Source: Home / Self Care | Attending: Obstetrics & Gynecology

## 2022-01-21 ENCOUNTER — Ambulatory Visit (HOSPITAL_COMMUNITY): Payer: Medicaid Other | Admitting: Vascular Surgery

## 2022-01-21 DIAGNOSIS — D271 Benign neoplasm of left ovary: Secondary | ICD-10-CM | POA: Insufficient documentation

## 2022-01-21 DIAGNOSIS — N83202 Unspecified ovarian cyst, left side: Secondary | ICD-10-CM | POA: Diagnosis not present

## 2022-01-21 DIAGNOSIS — N838 Other noninflammatory disorders of ovary, fallopian tube and broad ligament: Secondary | ICD-10-CM

## 2022-01-21 DIAGNOSIS — K660 Peritoneal adhesions (postprocedural) (postinfection): Secondary | ICD-10-CM | POA: Diagnosis not present

## 2022-01-21 DIAGNOSIS — Z87891 Personal history of nicotine dependence: Secondary | ICD-10-CM | POA: Insufficient documentation

## 2022-01-21 DIAGNOSIS — Z302 Encounter for sterilization: Secondary | ICD-10-CM | POA: Insufficient documentation

## 2022-01-21 DIAGNOSIS — N83292 Other ovarian cyst, left side: Secondary | ICD-10-CM | POA: Diagnosis not present

## 2022-01-21 DIAGNOSIS — N736 Female pelvic peritoneal adhesions (postinfective): Secondary | ICD-10-CM | POA: Diagnosis not present

## 2022-01-21 HISTORY — PX: LAPAROSCOPIC BILATERAL SALPINGECTOMY: SHX5889

## 2022-01-21 LAB — POCT PREGNANCY, URINE: Preg Test, Ur: NEGATIVE

## 2022-01-21 SURGERY — SALPINGECTOMY, BILATERAL, LAPAROSCOPIC
Anesthesia: General | Site: Abdomen | Laterality: Bilateral

## 2022-01-21 MED ORDER — MEPERIDINE HCL 25 MG/ML IJ SOLN: 6.2500 mg | INTRAMUSCULAR | Status: DC | PRN

## 2022-01-21 MED ORDER — ONDANSETRON HCL 4 MG/2ML IJ SOLN
4.0000 mg | Freq: Once | INTRAMUSCULAR | Status: AC | PRN
  Administered 2022-01-21: 4 mg via INTRAVENOUS

## 2022-01-21 MED ORDER — LACTATED RINGERS IV SOLN: INTRAVENOUS | Status: DC

## 2022-01-21 MED ORDER — FENTANYL CITRATE (PF) 250 MCG/5ML IJ SOLN
INTRAMUSCULAR | Status: DC | PRN
  Administered 2022-01-21: 100 ug via INTRAVENOUS
  Administered 2022-01-21 (×3): 50 ug via INTRAVENOUS

## 2022-01-21 MED ORDER — OXYCODONE HCL 5 MG/5ML PO SOLN: 5.0000 mg | Freq: Once | ORAL | Status: DC | PRN

## 2022-01-21 MED ORDER — LIDOCAINE-EPINEPHRINE 1 %-1:100000 IJ SOLN
INTRAMUSCULAR | Status: AC
  Filled 2022-01-21: qty 1

## 2022-01-21 MED ORDER — ACETAMINOPHEN 10 MG/ML IV SOLN
INTRAVENOUS | Status: DC | PRN
  Administered 2022-01-21: 1000 mg via INTRAVENOUS

## 2022-01-21 MED ORDER — LIDOCAINE 2% (20 MG/ML) 5 ML SYRINGE
INTRAMUSCULAR | Status: DC | PRN
  Administered 2022-01-21: 30 mg via INTRAVENOUS

## 2022-01-21 MED ORDER — LIDOCAINE-EPINEPHRINE 1 %-1:100000 IJ SOLN
INTRAMUSCULAR | Status: DC | PRN
  Administered 2022-01-21: 20 mL

## 2022-01-21 MED ORDER — FENTANYL CITRATE (PF) 100 MCG/2ML IJ SOLN: 25.0000 ug | INTRAMUSCULAR | Status: DC | PRN

## 2022-01-21 MED ORDER — KETOROLAC TROMETHAMINE 30 MG/ML IJ SOLN
INTRAMUSCULAR | Status: DC | PRN
  Administered 2022-01-21: 30 mg via INTRAVENOUS

## 2022-01-21 MED ORDER — PROPOFOL 10 MG/ML IV BOLUS
INTRAVENOUS | Status: DC | PRN
  Administered 2022-01-21: 200 mg via INTRAVENOUS

## 2022-01-21 MED ORDER — MIDAZOLAM HCL 2 MG/2ML IJ SOLN
INTRAMUSCULAR | Status: AC
  Filled 2022-01-21: qty 2

## 2022-01-21 MED ORDER — SUGAMMADEX SODIUM 200 MG/2ML IV SOLN
INTRAVENOUS | Status: DC | PRN
  Administered 2022-01-21: 400 mg via INTRAVENOUS

## 2022-01-21 MED ORDER — ACETAMINOPHEN 10 MG/ML IV SOLN
INTRAVENOUS | Status: AC
  Filled 2022-01-21: qty 100

## 2022-01-21 MED ORDER — ONDANSETRON HCL 4 MG/2ML IJ SOLN
INTRAMUSCULAR | Status: AC
  Filled 2022-01-21: qty 2

## 2022-01-21 MED ORDER — ROCURONIUM BROMIDE 10 MG/ML (PF) SYRINGE
PREFILLED_SYRINGE | INTRAVENOUS | Status: DC | PRN
  Administered 2022-01-21: 10 mg via INTRAVENOUS
  Administered 2022-01-21: 50 mg via INTRAVENOUS
  Administered 2022-01-21: 10 mg via INTRAVENOUS

## 2022-01-21 MED ORDER — OXYCODONE HCL 5 MG PO TABS: 5.0000 mg | ORAL_TABLET | Freq: Once | ORAL | Status: DC | PRN

## 2022-01-21 MED ORDER — VASOPRESSIN 20 UNIT/ML IV SOLN
INTRAVENOUS | Status: AC
  Filled 2022-01-21: qty 1

## 2022-01-21 MED ORDER — OXYCODONE HCL 5 MG PO TABS: 5.0000 mg | ORAL_TABLET | ORAL | 0 refills | Status: DC | PRN

## 2022-01-21 MED ORDER — FENTANYL CITRATE (PF) 250 MCG/5ML IJ SOLN
INTRAMUSCULAR | Status: AC
  Filled 2022-01-21: qty 5

## 2022-01-21 MED ORDER — ONDANSETRON HCL 4 MG/2ML IJ SOLN
INTRAMUSCULAR | Status: DC | PRN
  Administered 2022-01-21: 4 mg via INTRAVENOUS

## 2022-01-21 MED ORDER — MIDAZOLAM HCL 2 MG/2ML IJ SOLN
INTRAMUSCULAR | Status: DC | PRN
  Administered 2022-01-21: 2 mg via INTRAVENOUS

## 2022-01-21 MED ORDER — SILVER NITRATE-POT NITRATE 75-25 % EX MISC
CUTANEOUS | Status: AC
  Filled 2022-01-21: qty 40

## 2022-01-21 MED ORDER — ORAL CARE MOUTH RINSE: 15.0000 mL | Freq: Once | OROMUCOSAL | Status: AC

## 2022-01-21 MED ORDER — DEXAMETHASONE SODIUM PHOSPHATE 10 MG/ML IJ SOLN
INTRAMUSCULAR | Status: DC | PRN
  Administered 2022-01-21: 10 mg via INTRAVENOUS

## 2022-01-21 MED ORDER — IBUPROFEN 800 MG PO TABS: 800.0000 mg | ORAL_TABLET | Freq: Three times a day (TID) | ORAL | Status: DC | PRN

## 2022-01-21 MED ORDER — SUCCINYLCHOLINE CHLORIDE 200 MG/10ML IV SOSY: PREFILLED_SYRINGE | INTRAVENOUS | Status: DC | PRN

## 2022-01-21 MED ORDER — SODIUM CHLORIDE 0.9 % IR SOLN
Status: DC | PRN
  Administered 2022-01-21: 1000 mL

## 2022-01-21 MED ORDER — ACETAMINOPHEN 325 MG PO TABS: 325.0000 mg | ORAL_TABLET | ORAL | Status: DC | PRN

## 2022-01-21 MED ORDER — ACETAMINOPHEN 160 MG/5ML PO SOLN: 325.0000 mg | ORAL | Status: DC | PRN

## 2022-01-21 MED ORDER — POVIDONE-IODINE 10 % EX SWAB
2.0000 "application " | Freq: Once | CUTANEOUS | Status: AC
  Administered 2022-01-21: 2 via TOPICAL

## 2022-01-21 MED ORDER — CEFAZOLIN SODIUM-DEXTROSE 2-4 GM/100ML-% IV SOLN
2.0000 g | INTRAVENOUS | Status: AC
  Administered 2022-01-21: 2 g via INTRAVENOUS
  Filled 2022-01-21: qty 100

## 2022-01-21 MED ORDER — SODIUM CHLORIDE (PF) 0.9 % IJ SOLN
INTRAMUSCULAR | Status: AC
  Filled 2022-01-21: qty 50

## 2022-01-21 MED ORDER — PROPOFOL 10 MG/ML IV BOLUS
INTRAVENOUS | Status: AC
  Filled 2022-01-21: qty 20

## 2022-01-21 MED ORDER — CHLORHEXIDINE GLUCONATE 0.12 % MT SOLN
15.0000 mL | Freq: Once | OROMUCOSAL | Status: AC
  Administered 2022-01-21: 15 mL via OROMUCOSAL
  Filled 2022-01-21: qty 15

## 2022-01-21 MED ORDER — ESMOLOL HCL 100 MG/10ML IV SOLN
INTRAVENOUS | Status: DC | PRN
  Administered 2022-01-21: 20 mg via INTRAVENOUS

## 2022-01-21 SURGICAL SUPPLY — 43 items
ADH SKN CLS LQ APL DERMABOND (GAUZE/BANDAGES/DRESSINGS) ×1
BAG SPEC RTRVL 10 TROC 200 (ENDOMECHANICALS) ×1
BAG SPEC RTRVL LRG 6X4 10 (ENDOMECHANICALS)
CABLE HIGH FREQUENCY MONO STRZ (ELECTRODE) IMPLANT
DECANTER SPIKE VIAL GLASS SM (MISCELLANEOUS) ×1 IMPLANT
DERMABOND ADHESIVE PROPEN (GAUZE/BANDAGES/DRESSINGS) ×1
DERMABOND ADVANCED .7 DNX6 (GAUZE/BANDAGES/DRESSINGS) IMPLANT
DRSG OPSITE POSTOP 3X4 (GAUZE/BANDAGES/DRESSINGS) ×1 IMPLANT
DURAPREP 26ML APPLICATOR (WOUND CARE) ×2 IMPLANT
GLOVE SURG POLYISO LF SZ6.5 (GLOVE) ×2 IMPLANT
GLOVE SURG UNDER POLY LF SZ7 (GLOVE) ×4 IMPLANT
GOWN STRL REUS W/ TWL LRG LVL3 (GOWN DISPOSABLE) ×3 IMPLANT
GOWN STRL REUS W/TWL LRG LVL3 (GOWN DISPOSABLE) ×4
IRRIG SUCT STRYKERFLOW 2 WTIP (MISCELLANEOUS)
IRRIGATION SUCT STRKRFLW 2 WTP (MISCELLANEOUS) IMPLANT
KIT TURNOVER KIT B (KITS) ×2 IMPLANT
LIGASURE LAP ATLAS 10MM 37CM (INSTRUMENTS) IMPLANT
LIGASURE VESSEL 5MM BLUNT TIP (ELECTROSURGICAL) IMPLANT
NDL INSUFFLATION 14GA 120MM (NEEDLE) IMPLANT
NEEDLE INSUFFLATION 14GA 120MM (NEEDLE) ×2 IMPLANT
NS IRRIG 1000ML POUR BTL (IV SOLUTION) ×2 IMPLANT
PACK LAPAROSCOPY BASIN (CUSTOM PROCEDURE TRAY) ×1 IMPLANT
PACK TRENDGUARD 450 HYBRID PRO (MISCELLANEOUS) IMPLANT
PAD ABD 8X10 STRL (GAUZE/BANDAGES/DRESSINGS) ×1 IMPLANT
POUCH ENDO CATCH II 15MM (MISCELLANEOUS) ×1 IMPLANT
POUCH RETRIEVAL ECOSAC 10 (ENDOMECHANICALS) IMPLANT
POUCH RETRIEVAL ECOSAC 10MM (ENDOMECHANICALS) ×2
POUCH SPECIMEN RETRIEVAL 10MM (ENDOMECHANICALS) IMPLANT
PROTECTOR NERVE ULNAR (MISCELLANEOUS) ×4 IMPLANT
SET TUBE SMOKE EVAC HIGH FLOW (TUBING) ×2 IMPLANT
SLEEVE ENDOPATH XCEL 5M (ENDOMECHANICALS) ×2 IMPLANT
SOLUTION ELECTROLUBE (MISCELLANEOUS) ×1 IMPLANT
SUT MNCRL AB 4-0 PS2 18 (SUTURE) ×2 IMPLANT
SUT VICRYL 0 UR6 27IN ABS (SUTURE) ×1 IMPLANT
SYSTEM CARTER THOMASON II (TROCAR) ×1 IMPLANT
TOWEL GREEN STERILE FF (TOWEL DISPOSABLE) ×4 IMPLANT
TRAY FOLEY W/BAG SLVR 14FR (SET/KITS/TRAYS/PACK) ×2 IMPLANT
TRENDGUARD 450 HYBRID PRO PACK (MISCELLANEOUS) ×2
TROCAR BALLN 12MMX100 BLUNT (TROCAR) IMPLANT
TROCAR BLADELESS 15MM (ENDOMECHANICALS) ×1 IMPLANT
TROCAR XCEL NON-BLD 11X100MML (ENDOMECHANICALS) IMPLANT
TROCAR XCEL NON-BLD 5MMX100MML (ENDOMECHANICALS) ×2 IMPLANT
WARMER LAPAROSCOPE (MISCELLANEOUS) ×2 IMPLANT

## 2022-01-21 NOTE — Interval H&P Note (Signed)
History and Physical Interval Note:  01/21/2022 9:00 AM  Victoria Harvey  has presented today for surgery, with the diagnosis of ENCOUNTER FOR STERILIZATION.  The various methods of treatment have been discussed with the patient and family. After consideration of risks, benefits and other options for treatment, the patient has consented to  Procedure(s): LAPAROSCOPIC TUBAL LIGATION WITH REMOVAL OF BOTH TUBES (Bilateral)as a surgical intervention.  The patient's history has been reviewed, patient examined, no change in status, stable for surgery.  I have reviewed the patient's chart and labs.  Questions were answered to the patient's satisfaction.    Archie Endo, MD.

## 2022-01-21 NOTE — Transfer of Care (Signed)
Immediate Anesthesia Transfer of Care Note  Patient: Victoria Harvey  Procedure(s) Performed: Left salpingectomy / oopherectomy, right slpingectomy for sterilization, removal left ovaian mass (Bilateral: Abdomen)  Patient Location: PACU  Anesthesia Type:General  Level of Consciousness: drowsy  Airway & Oxygen Therapy: Patient Spontanous Breathing and Patient connected to nasal cannula oxygen  Post-op Assessment: Report given to RN and Post -op Vital signs reviewed and stable  Post vital signs: Reviewed and stable  Last Vitals:  Vitals Value Taken Time  BP 144/101 01/21/22 1227  Temp    Pulse 92 01/21/22 1228  Resp    SpO2 100 % 01/21/22 1228  Vitals shown include unvalidated device data.  Last Pain:  Vitals:   01/21/22 0730  TempSrc:   PainSc: 0-No pain         Complications: No notable events documented.

## 2022-01-21 NOTE — Op Note (Addendum)
Victoria Harvey. Victoria Harvey DOB: [birth year] MRN: [id number] Date of procedure: 01/21/22.   PREOP DIAGNOSIS: 1.  Parous (41 year old Para 2) patient who does not desire continued fertility.     POSTOP DIAGNOSIS: 1.  Parous(41 year old Para 2) patient who does not desire continued fertility.   2. Left ovary enlarged with a large complex cyst.   3. Dense adhesions between sigmoid colon and left ovary with mass and between sigmoid colon and left fallopian tube.     PROCEDURES:  Laparoscopic bilateral salpingectomy. Lysis of adhesions. Left oophorectomy.    SURGEON: Dr. Waymon Amato  INTRA-OP CONSULTATION: Dr. Jesusita Oka (General and Trauma Surgery).     ASSISTANTS: None   ANESTHESIA: General Endotracheal Anesthesia.    COMPLICATIONS: None   EBL: minimal, less than 10 cc.    IV FLUID:  1200 mL LR   URINE OUTPUT: Patient voided immediately before her surgery.    FINDINGS: Normal uterus.  Normal right ovary, normal right fallopian tube.  Left ovary enlarged with a complex cyst (about 8 cm large).  Short left fallopian tube with dense adhesions between left tube and sigmoid colon and left ovary, fimbria end of left tube not visualized.  Normal liver edge. Normal appendix. Normal ureters bilaterally.         PROCEDURE:    Informed consent was obtained from the patient to undergo the sterilization procedure after discussing the risks benefits and alternatives of the procedure. She was taken to the operating room where anesthesia was administered without difficulty. Both arms were tucked and she was placed in the dorsal lithotomy position. She was prepped abdominally, vaginally and perineum in the usual sterile fashion. Hulka uterine manipulator was placed in cervix.     Attention was then turned to the abdomen where lidocaine with epinephrine was instilled in the infraumbilical area. The skin was incised with a scalpel and and entry into the abdomen was made with the Veress needle.  Correct  placement was confirmed with Normal saline suction and irrigation tests.  Low abdominal pressures noted that increased as abdomen was insufflated up to a maximum of 15 mm Hg.  The 45mm excel trocar (excel trocars and ports used for the surgery) was then placed with direct visualisation.  She was placed in T-burg position. Two 5 mm ports were placed on left and right lower abdomen 2 finger breaths medial and superior to the ASIS bilaterally.  The abdomen and pelvis was inspected with the above findings noted.  The right fallopian tube was then transected with the ligasure after ligating  the mesosalpinx area.  The tube was removed through the 45mm port. The left fallopian tubes could not be visualized easily due to the large ovarian mass and adhesions to the sigmoid colon.  I performed some lysis of adhesions with endo shears (no energy used) but got concerned as I was very close to the sigmoid colon. Therefore I obtained a general surgery consult and Dr. Bobbye Morton presented.  Under her guidance I was able to further separate the sigmoid colon from the ovary which improved accessibility to the left fallopian tube but not completely. I was able to transect a portion of the left fallopian tube with the ligasure.  Dr. Bobbye Morton then scrubbed in at this point to help with further lysis of adhesions between the sigmoid colon and left tube and adhesions between the left ovary and tube.  Please see her op note for the details of this part of the procedure.  After lysis of adhesions (I used about 15 minutes for lysis of adhesions and Dr. Bobbye Morton also used about 15 minutes, 30 minutes total time for lysis of adhesions), the sigmoid colon was inspected and found to be intact. After this Dr. Bobbye Morton was relieved and I continued with the surgery.  I then proceeded to remove more of the remaining left fallopian tube that was close to the ovarian mass.  Portions of the ligated and transected left fallopian tube were delivered through the  25mm port.  At this point I called patient's power of attorney, her mother, Victoria Harvey to  discuss intra-op findings and management options.  Victoria Harvey was not in the hospital at that time therefore a telephone conversation and consent were done.   We discussed findings of the left ovary and options of proceeding with removal of the lesion today or having the patient return another day to remove the lesion.  Victoria Harvey consented to proceed with removal of the lesion today.  We discussed that given the lesions large size, complex appearance and lack of prior testing on the lesion that removal of the lesion together with the left ovary was recommended.  Victoria Harvey verbally consented to proceeding with removal of the lesion together with the left ovary.  The lesion was removed by identifying the left infundibulopelvic (IP) ligament which was ligated and then transected with the 66mm ligasure applied close to the ovary and away from the ureter coursing below it.  The left uterosacral ligament was similarly ligated and transected with the ligasure and the specimen placed in the cul de sac.  The right lower port site was replaced with the 66mm port. The 77mm endocatch bag however could not be deployed through this port.  The Eco bag was then placed in though the 18mm port and the specimen placed in the bag and brought to the skin level.  The cyst was punctured and copious sebaceous material was released.  This was suctioned out. The ovarian cyst contents were then removed piece meal with the kocher clamps and other cyst components noted to include hair and teeth.  The specimen bag remained intact through out and there was no spillage of components in the pelvis.   After retrieval of the bag  the 35mm port site was closed with the Carter-Thomason Closure device under direct visualization , 0-vicryl used.  Suction was applied and irrigated in the pelvis.  The pelvis was inspected and it  was noted to be hemostatic.The gas was turned off and allowed to escape.  Anesthesia gave manual inflations.  Ports were removed under direct visualization and patient taken out of T- burg position.  All three skin incisions were then closed off with 4-0 monocryl and dermabond placed on them.  The Hulka manipulator was removed.  The patient was then awoken from anesthesia and she was taken to recovery room in stable condition.   SPECIMENS:  Complete right fallopian tube. Portions of left fallopian tube. Left ovary with ovarian mass.  Pelvic washings.     Johntay Doolen, MD. 01/21/22.

## 2022-01-21 NOTE — Anesthesia Postprocedure Evaluation (Signed)
Anesthesia Post Note  Patient: DEBE ANFINSON  Procedure(s) Performed: Left salpingectomy / oopherectomy, right slpingectomy for sterilization, removal left ovaian mass (Bilateral: Abdomen)     Patient location during evaluation: PACU Anesthesia Type: General Level of consciousness: awake and alert Pain management: pain level controlled Vital Signs Assessment: post-procedure vital signs reviewed and stable Respiratory status: spontaneous breathing, nonlabored ventilation, respiratory function stable and patient connected to nasal cannula oxygen Cardiovascular status: blood pressure returned to baseline and stable Postop Assessment: no apparent nausea or vomiting Anesthetic complications: no   No notable events documented.  Last Vitals:  Vitals:   01/21/22 1300 01/21/22 1315  BP: (!) 153/91 (!) 146/91  Pulse: 85 76  Resp: 16 18  Temp:    SpO2: 97% 98%    Last Pain:  Vitals:   01/21/22 1300  TempSrc:   PainSc: 0-No pain                 Kaelob Persky

## 2022-01-21 NOTE — Anesthesia Procedure Notes (Signed)
Procedure Name: Intubation Date/Time: 01/21/2022 9:10 AM Performed by: Eligha Bridegroom, CRNA Pre-anesthesia Checklist: Patient identified, Emergency Drugs available, Suction available, Patient being monitored and Timeout performed Patient Re-evaluated:Patient Re-evaluated prior to induction Oxygen Delivery Method: Circle system utilized Preoxygenation: Pre-oxygenation with 100% oxygen Induction Type: IV induction Laryngoscope Size: Mac and 3 Grade View: Grade I Tube type: Oral Tube size: 7.0 mm Number of attempts: 1 Airway Equipment and Method: Stylet Placement Confirmation: ETT inserted through vocal cords under direct vision, positive ETCO2 and breath sounds checked- equal and bilateral Secured at: 22 cm Tube secured with: Tape Dental Injury: Teeth and Oropharynx as per pre-operative assessment

## 2022-01-21 NOTE — Op Note (Signed)
° °  Operative Note   Date: 01/21/2022  Procedure: laparoscopic adhesiolysis  Indication and clinical history: I was called as an intraoperative consultation for this patient who is a 41 y.o. year old female undergoing lapaproscopic bilateral tubal ligation by Dr. Alesia Richards, during which a large left ovarian mass was visualized and noted to be adhesed to the sigmoid colon.  Surgeon: Jesusita Oka, MD  Anesthesia: General  Findings:  Specimen: none EBL: Donzetta Kohut Drains/Implants: none  Disposition:  per primary surgeon  Description of procedure: The patient was already under general anesthetic, positioned in steep Trendelenburg, and laparoscopic entry already performed. A large left ovarian mass was visualized. I performed a laparoscopic adhesiolysis of the left fallopian tube off the sigmoid colon. The colon was inspected and no colonic injury was noted. Please see Dr. Fonnie Jarvis note for a description of the preceding and subsequent portions of the surgical procedure.    Jesusita Oka, MD General and [facility]

## 2022-01-21 NOTE — Discharge Instructions (Signed)
[place]  You may shower as usual, keeping water exposure over the abdomen to a minimal and patting the incisions dry after showering.  The glue used on the skin will start peeling off, it will appear as dark clumpy material, genly pat dry as needed, do not rub it off.     2. Do not have any intercourse after the procedure until cleared.  Expect some light vaginal bleeding, do not use tampons or douching for the next two weeks.    3. You may have some back pain, keep up with regular walking and pain medication to help with the pain.   4. Call us if you are not doing well, any nausea, vomiting, fevers or chills.   Dr. Alesia Richards.

## 2022-01-21 NOTE — Addendum Note (Signed)
Addendum  created 01/21/22 1522 by Eligha Bridegroom, CRNA   Flowsheet accepted

## 2022-01-22 ENCOUNTER — Encounter (HOSPITAL_COMMUNITY): Payer: Self-pay | Admitting: Obstetrics & Gynecology

## 2022-01-23 LAB — SURGICAL PATHOLOGY

## 2022-01-23 LAB — CYTOLOGY - NON PAP

## 2022-01-30 DIAGNOSIS — Z419 Encounter for procedure for purposes other than remedying health state, unspecified: Secondary | ICD-10-CM | POA: Diagnosis not present

## 2022-02-04 DIAGNOSIS — Z09 Encounter for follow-up examination after completed treatment for conditions other than malignant neoplasm: Secondary | ICD-10-CM | POA: Diagnosis not present

## 2022-02-04 DIAGNOSIS — D271 Benign neoplasm of left ovary: Secondary | ICD-10-CM | POA: Diagnosis not present

## 2022-02-25 DIAGNOSIS — L7682 Other postprocedural complications of skin and subcutaneous tissue: Secondary | ICD-10-CM | POA: Diagnosis not present

## 2022-02-25 DIAGNOSIS — Z09 Encounter for follow-up examination after completed treatment for conditions other than malignant neoplasm: Secondary | ICD-10-CM | POA: Diagnosis not present

## 2022-02-25 DIAGNOSIS — Z4803 Encounter for change or removal of drains: Secondary | ICD-10-CM | POA: Diagnosis not present

## 2022-03-02 DIAGNOSIS — Z419 Encounter for procedure for purposes other than remedying health state, unspecified: Secondary | ICD-10-CM | POA: Diagnosis not present

## 2022-03-21 DIAGNOSIS — Z4803 Encounter for change or removal of drains: Secondary | ICD-10-CM | POA: Diagnosis not present

## 2022-03-21 DIAGNOSIS — Z09 Encounter for follow-up examination after completed treatment for conditions other than malignant neoplasm: Secondary | ICD-10-CM | POA: Diagnosis not present

## 2022-03-21 DIAGNOSIS — L7682 Other postprocedural complications of skin and subcutaneous tissue: Secondary | ICD-10-CM | POA: Diagnosis not present

## 2022-04-01 DIAGNOSIS — Z419 Encounter for procedure for purposes other than remedying health state, unspecified: Secondary | ICD-10-CM | POA: Diagnosis not present

## 2022-04-15 DIAGNOSIS — E663 Overweight: Secondary | ICD-10-CM | POA: Diagnosis not present

## 2022-04-15 DIAGNOSIS — R030 Elevated blood-pressure reading, without diagnosis of hypertension: Secondary | ICD-10-CM | POA: Diagnosis not present

## 2022-04-15 DIAGNOSIS — Z713 Dietary counseling and surveillance: Secondary | ICD-10-CM | POA: Diagnosis not present

## 2022-04-15 DIAGNOSIS — G4709 Other insomnia: Secondary | ICD-10-CM | POA: Diagnosis not present

## 2022-04-15 DIAGNOSIS — Z6826 Body mass index (BMI) 26.0-26.9, adult: Secondary | ICD-10-CM | POA: Diagnosis not present

## 2022-04-25 DIAGNOSIS — F4321 Adjustment disorder with depressed mood: Secondary | ICD-10-CM | POA: Diagnosis not present

## 2022-05-02 DIAGNOSIS — Z419 Encounter for procedure for purposes other than remedying health state, unspecified: Secondary | ICD-10-CM | POA: Diagnosis not present

## 2022-05-09 DIAGNOSIS — F4321 Adjustment disorder with depressed mood: Secondary | ICD-10-CM | POA: Diagnosis not present

## 2022-05-15 DIAGNOSIS — F4321 Adjustment disorder with depressed mood: Secondary | ICD-10-CM | POA: Diagnosis not present

## 2022-05-16 DIAGNOSIS — R030 Elevated blood-pressure reading, without diagnosis of hypertension: Secondary | ICD-10-CM | POA: Diagnosis not present

## 2022-05-16 DIAGNOSIS — R5383 Other fatigue: Secondary | ICD-10-CM | POA: Diagnosis not present

## 2022-05-22 DIAGNOSIS — F4321 Adjustment disorder with depressed mood: Secondary | ICD-10-CM | POA: Diagnosis not present

## 2022-05-27 DIAGNOSIS — F4321 Adjustment disorder with depressed mood: Secondary | ICD-10-CM | POA: Diagnosis not present

## 2022-06-01 DIAGNOSIS — Z419 Encounter for procedure for purposes other than remedying health state, unspecified: Secondary | ICD-10-CM | POA: Diagnosis not present

## 2022-06-03 DIAGNOSIS — F4321 Adjustment disorder with depressed mood: Secondary | ICD-10-CM | POA: Diagnosis not present

## 2022-06-05 DIAGNOSIS — R030 Elevated blood-pressure reading, without diagnosis of hypertension: Secondary | ICD-10-CM | POA: Diagnosis not present

## 2022-06-05 DIAGNOSIS — E663 Overweight: Secondary | ICD-10-CM | POA: Diagnosis not present

## 2022-06-24 DIAGNOSIS — R030 Elevated blood-pressure reading, without diagnosis of hypertension: Secondary | ICD-10-CM | POA: Diagnosis not present

## 2022-07-01 DIAGNOSIS — F4321 Adjustment disorder with depressed mood: Secondary | ICD-10-CM | POA: Diagnosis not present

## 2022-07-02 DIAGNOSIS — Z419 Encounter for procedure for purposes other than remedying health state, unspecified: Secondary | ICD-10-CM | POA: Diagnosis not present

## 2022-07-05 DIAGNOSIS — L299 Pruritus, unspecified: Secondary | ICD-10-CM | POA: Diagnosis not present

## 2022-07-05 DIAGNOSIS — T465X5A Adverse effect of other antihypertensive drugs, initial encounter: Secondary | ICD-10-CM | POA: Diagnosis not present

## 2022-07-05 DIAGNOSIS — T7840XA Allergy, unspecified, initial encounter: Secondary | ICD-10-CM | POA: Diagnosis not present

## 2022-07-17 DIAGNOSIS — Z713 Dietary counseling and surveillance: Secondary | ICD-10-CM | POA: Diagnosis not present

## 2022-07-17 DIAGNOSIS — Z6827 Body mass index (BMI) 27.0-27.9, adult: Secondary | ICD-10-CM | POA: Diagnosis not present

## 2022-07-17 DIAGNOSIS — I10 Essential (primary) hypertension: Secondary | ICD-10-CM | POA: Diagnosis not present

## 2022-08-02 DIAGNOSIS — Z419 Encounter for procedure for purposes other than remedying health state, unspecified: Secondary | ICD-10-CM | POA: Diagnosis not present

## 2022-08-22 DIAGNOSIS — Z713 Dietary counseling and surveillance: Secondary | ICD-10-CM | POA: Diagnosis not present

## 2022-08-22 DIAGNOSIS — Z6827 Body mass index (BMI) 27.0-27.9, adult: Secondary | ICD-10-CM | POA: Diagnosis not present

## 2022-09-01 DIAGNOSIS — Z419 Encounter for procedure for purposes other than remedying health state, unspecified: Secondary | ICD-10-CM | POA: Diagnosis not present

## 2022-09-25 DIAGNOSIS — N930 Postcoital and contact bleeding: Secondary | ICD-10-CM | POA: Diagnosis not present

## 2022-09-25 DIAGNOSIS — N842 Polyp of vagina: Secondary | ICD-10-CM | POA: Diagnosis not present

## 2022-09-25 DIAGNOSIS — Z113 Encounter for screening for infections with a predominantly sexual mode of transmission: Secondary | ICD-10-CM | POA: Diagnosis not present

## 2022-10-01 DIAGNOSIS — N930 Postcoital and contact bleeding: Secondary | ICD-10-CM | POA: Diagnosis not present

## 2022-10-01 DIAGNOSIS — N83291 Other ovarian cyst, right side: Secondary | ICD-10-CM | POA: Diagnosis not present

## 2022-10-01 DIAGNOSIS — N842 Polyp of vagina: Secondary | ICD-10-CM | POA: Diagnosis not present

## 2022-10-02 DIAGNOSIS — Z419 Encounter for procedure for purposes other than remedying health state, unspecified: Secondary | ICD-10-CM | POA: Diagnosis not present

## 2022-10-11 DIAGNOSIS — Z1231 Encounter for screening mammogram for malignant neoplasm of breast: Secondary | ICD-10-CM | POA: Diagnosis not present

## 2022-10-31 DIAGNOSIS — Z0001 Encounter for general adult medical examination with abnormal findings: Secondary | ICD-10-CM | POA: Diagnosis not present

## 2022-10-31 DIAGNOSIS — N842 Polyp of vagina: Secondary | ICD-10-CM | POA: Diagnosis not present

## 2022-10-31 DIAGNOSIS — Z309 Encounter for contraceptive management, unspecified: Secondary | ICD-10-CM | POA: Diagnosis not present

## 2022-10-31 DIAGNOSIS — Z0000 Encounter for general adult medical examination without abnormal findings: Secondary | ICD-10-CM | POA: Diagnosis not present

## 2022-10-31 DIAGNOSIS — N83291 Other ovarian cyst, right side: Secondary | ICD-10-CM | POA: Diagnosis not present

## 2022-10-31 DIAGNOSIS — Z6829 Body mass index (BMI) 29.0-29.9, adult: Secondary | ICD-10-CM | POA: Diagnosis not present

## 2022-11-01 DIAGNOSIS — Z419 Encounter for procedure for purposes other than remedying health state, unspecified: Secondary | ICD-10-CM | POA: Diagnosis not present

## 2022-11-11 ENCOUNTER — Other Ambulatory Visit: Payer: Self-pay | Admitting: Obstetrics & Gynecology

## 2022-11-21 DIAGNOSIS — R102 Pelvic and perineal pain: Secondary | ICD-10-CM | POA: Diagnosis not present

## 2022-11-21 DIAGNOSIS — N83291 Other ovarian cyst, right side: Secondary | ICD-10-CM | POA: Diagnosis not present

## 2022-12-02 DIAGNOSIS — Z419 Encounter for procedure for purposes other than remedying health state, unspecified: Secondary | ICD-10-CM | POA: Diagnosis not present

## 2022-12-12 DIAGNOSIS — E569 Vitamin deficiency, unspecified: Secondary | ICD-10-CM | POA: Diagnosis not present

## 2022-12-12 DIAGNOSIS — R7303 Prediabetes: Secondary | ICD-10-CM | POA: Diagnosis not present

## 2022-12-12 DIAGNOSIS — D649 Anemia, unspecified: Secondary | ICD-10-CM | POA: Diagnosis not present

## 2022-12-12 DIAGNOSIS — I10 Essential (primary) hypertension: Secondary | ICD-10-CM | POA: Diagnosis not present

## 2022-12-12 DIAGNOSIS — G4700 Insomnia, unspecified: Secondary | ICD-10-CM | POA: Diagnosis not present

## 2022-12-17 ENCOUNTER — Encounter (HOSPITAL_BASED_OUTPATIENT_CLINIC_OR_DEPARTMENT_OTHER): Payer: Self-pay | Admitting: Obstetrics & Gynecology

## 2022-12-17 NOTE — Progress Notes (Signed)
Spoke w/ via phone for pre-op interview--- [employer]---- UPT, ISTAT.              Lab results------Current EKG in Epic dated 01/16/2022 COVID test -----patient states asymptomatic no test needed Arrive at -------1215 NPO after MN NO Solid Food.  Clear liquids from MN until---1115 Med rec completed Medications to take morning of surgery -----NONE Diabetic medication ----- Patient instructed no nail polish to be worn day of surgery Patient instructed to bring photo id and insurance card day of surgery Patient aware to have Driver (ride ) / caregiver Victoria Harvey   for 24 hours after surgery  Patient Special Instructions ----- Pre-Op special Istructions ----- Patient verbalized understanding of instructions that were given at this phone interview. Patient denies shortness of breath, chest pain, fever, cough at this phone interview.

## 2022-12-23 DIAGNOSIS — Z01818 Encounter for other preprocedural examination: Secondary | ICD-10-CM

## 2022-12-23 DIAGNOSIS — I10 Essential (primary) hypertension: Secondary | ICD-10-CM

## 2023-01-02 DIAGNOSIS — Z419 Encounter for procedure for purposes other than remedying health state, unspecified: Secondary | ICD-10-CM | POA: Diagnosis not present

## 2023-01-31 DIAGNOSIS — Z419 Encounter for procedure for purposes other than remedying health state, unspecified: Secondary | ICD-10-CM | POA: Diagnosis not present

## 2023-02-05 ENCOUNTER — Encounter (HOSPITAL_BASED_OUTPATIENT_CLINIC_OR_DEPARTMENT_OTHER): Payer: Self-pay | Admitting: Obstetrics & Gynecology

## 2023-02-05 NOTE — Progress Notes (Signed)
Spoke w/ via phone for pre-op interview--- Victoria Harvey Lab needs dos----  ISTAT,EKG and UPT             Lab results------ COVID test -----patient states asymptomatic no test needed Arrive at -------0800 NPO after MN NO Solid Food.  Clear liquids from MN until---0700 Med rec completed Medications to take morning of surgery -----NONE Diabetic medication ----- Patient instructed no nail polish to be worn day of surgery Patient instructed to bring photo id and insurance card day of surgery Patient aware to have Driver (ride ) / caregiver Victoria Harvey   for 24 hours after surgery  Patient Special Instructions ----- Pre-Op special Istructions ----- Patient verbalized understanding of instructions that were given at this phone interview. Patient denies shortness of breath, chest pain, fever, cough at this phone interview.

## 2023-02-07 ENCOUNTER — Other Ambulatory Visit: Payer: Self-pay | Admitting: Obstetrics & Gynecology

## 2023-02-08 NOTE — Anesthesia Preprocedure Evaluation (Signed)
Anesthesia Evaluation  Patient identified by MRN, date of birth, ID band Patient awake    Reviewed: Allergy & Precautions, H&P , NPO status , Patient's Chart, lab work & pertinent test results  History of Anesthesia Complications Negative for: history of anesthetic complications  Airway Mallampati: II  TM Distance: >3 FB Neck ROM: Full    Dental  (+) Chipped, Missing, Dental Advisory Given, Partial Upper,    Pulmonary former smoker   Pulmonary exam normal        Cardiovascular Exercise Tolerance: Good hypertension, Pt. on medications Normal cardiovascular exam  EKG: 01/16/22: Normal sinus rhythm Cannot rule out Anterior infarct , age undetermined Abnormal ECG When compared with ECG of 06-Dec-2016 02:55, PREVIOUS ECG IS PRESENT No significant change since last tracing Confirmed by Quay Burow [id number]) on 01/16/2022 1:11:48 PM   Neuro/Psych negative neurological ROS  negative psych ROS   GI/Hepatic negative GI ROS, Neg liver ROS,,,  Endo/Other  negative endocrine ROS    Renal/GU negative Renal ROS  negative genitourinary   Musculoskeletal negative musculoskeletal ROS (+)    Abdominal   Peds negative pediatric ROS (+)  Hematology negative hematology ROS (+)   Anesthesia Other Findings   Reproductive/Obstetrics                             Anesthesia Physical Anesthesia Plan  ASA: 2  Anesthesia Plan: General   Post-op Pain Management: Tylenol PO (pre-op)* and Toradol IV (intra-op)*   Induction: Intravenous  PONV Risk Score and Plan: 4 or greater and Ondansetron, Dexamethasone, Midazolam and Scopolamine patch - Pre-op  Airway Management Planned: Oral ETT  Additional Equipment:   Intra-op Plan:   Post-operative Plan: Extubation in OR  Informed Consent: I have reviewed the patients History and Physical, chart, labs and discussed the procedure including the risks, benefits  and alternatives for the proposed anesthesia with the patient or authorized representative who has indicated his/her understanding and acceptance.     Dental advisory given  Plan Discussed with: Anesthesiologist and CRNA  Anesthesia Plan Comments: ( )        Anesthesia Quick Evaluation

## 2023-02-09 NOTE — H&P (Signed)
Victoria Harvey is an 42 y.o. female with complex right ovarian cysts (suspect dermoidS) and pelvic pain as well as vaginal polyps and postcoital bleeding, here for a laparoscopic ovarian cystectomy as well as vaginal polypectomy.   Pertinent Gynecological History: Menses:  regular, monthly Bleeding: Post coital Contraception: tubal ligation DES exposure: unknown Blood transfusions: none Sexually transmitted diseases: no past history Previous GYN Procedures:  As below   Last mammogram: normal Date: 10/2021  Last pap: normal Date: 10/31/21 OB History: G2, P2  Menstrual History: Patient's last menstrual period was 01/24/2023 (exact date).    Past Medical History:  Diagnosis Date   Hypertension    Pinched nerve    in back; recently completed course of meds for sxs   Pneumonia    Pre-diabetes    Presence of partial dental prosthetic device    Strep sore throat     Past Surgical History:  Procedure Laterality Date   FRACTURE SURGERY Right    arm   LAPAROSCOPIC BILATERAL SALPINGECTOMY Bilateral 01/21/2022   Procedure: Left salpingectomy / oopherectomy, right slpingectomy for sterilization, removal left ovaian mass;  Surgeon: Waymon Amato, MD;  Location: [facility];  Service: Gynecology;  Laterality: Bilateral;   NO PAST SURGERIES     WISDOM TOOTH EXTRACTION      Family History  Problem Relation Age of Onset   Hypertension Mother    Hypertension Father    Cancer Maternal Grandmother        breast   Stroke Paternal Grandmother     Social History:  reports that she quit smoking about 8 years ago. Her smoking use included cigarettes. She has never used smokeless tobacco. She reports current alcohol use. She reports that she does not use drugs.  Allergies:  Allergies  Allergen Reactions   Levofloxacin Nausea And Vomiting    Current Outpatient Medications  Medication Instructions   aspirin-acetaminophen-caffeine (EXCEDRIN MIGRAINE) 250-250-65 MG tablet 2 tablets, Oral, Every 6  hours PRN   hydrochlorothiazide (HYDRODIURIL) 25 mg, Oral, Daily   Multiple Vitamin (MULTIVITAMIN WITH MINERALS) TABS tablet 1 tablet, Oral, Daily   oxyCODONE (ROXICODONE) 5 mg, Oral, Every 4 hours PRN   pantoprazole (PROTONIX) 20 mg, Oral, Daily   valsartan (DIOVAN) 80 mg, Oral, Daily   Vitamin D (Ergocalciferol) (DRISDOL) 50,000 Units, Oral, Every 7 days    Review of Systems Constitutional: Denies fevers/chills Cardiovascular: Denies chest pain or palpitations Pulmonary: Denies coughing or wheezing Gastrointestinal: Denies nausea, vomiting or diarrhea Genitourinary: Denies unusual vaginal bleeding, unusual vaginal discharge, dysuria, urgency or frequency. With pelvic pain.  With postcoital bleeding.  Musculoskeletal: Denies muscle or joint aches and pain.  Neurology: Denies abnormal sensations such as tingling or numbness.    Height '5\' 7"'$  (1.702 m), weight 79.4 kg, last menstrual period 01/24/2023. Blood pressure 120/77, pulse 77, temperature (!) 97.5 F (36.4 C), temperature source Oral, resp. rate 16, height '5\' 7"'$  (1.702 m), weight 80.6 kg, last menstrual period 01/24/2023, SpO2 99 %.  Physical Exam Vitals reviewed. Constitutional: She is oriented to person, place, and time. She appears well-developed and well-nourished.  HENT:  Head: Normocephalic and atraumatic.  Eyes: Conjunctivae and EOM are normal.  Neck: Normal range of motion. Neck supple.  Cardiovascular: Normal rate, regular rhythm, normal heart sounds and intact distal pulses.  Respiratory: Effort normal and breath sounds normal.  GI: Soft. She exhibits no mass. There is no tenderness.  Genitourinary: Uterus normal.  Vagina with 2 to 3 cm thin, pink fleshy masses at posterior right vaginal  wall, likely vaginal polyps.  Musculoskeletal: Normal range of motion.  Neurological: She is alert and oriented to person, place, and time.  Skin: Skin is warm and dry.  Psychiatric: She has a normal mood and affect. Judgment normal.     Ova 1 10/07/22: Low risk.  Pelvic Ultrasound 11/21/22: Uterus 10.4 x 4.8 x 5.2 cm. Right ovary with 2 complex structures 2 x 1.6 x 2.4 cm and 2.5 x 2.7 x 2.5 cm.    Recent Results (from the past 2160 hour(s))  Pregnancy, urine POC     Status: None   Collection Time: 02/10/23  8:02 AM  Result Value Ref Range   Preg Test, Ur NEGATIVE NEGATIVE    Comment:        THE SENSITIVITY OF THIS METHODOLOGY IS >24 mIU/mL   I-STAT, chem 8     Status: Abnormal   Collection Time: 02/10/23  8:37 AM  Result Value Ref Range   Sodium 142 135 - 145 mmol/L   Potassium 4.4 3.5 - 5.1 mmol/L   Chloride 106 98 - 111 mmol/L   BUN 22 (H) 6 - 20 mg/dL   Creatinine, Ser 0.90 0.44 - 1.00 mg/dL   Glucose, Bld 90 70 - 99 mg/dL    Comment: Glucose reference range applies only to samples taken after fasting for at least 8 hours.   Calcium, Ion 1.23 1.15 - 1.40 mmol/L   TCO2 27 22 - 32 mmol/L   Hemoglobin 13.6 12.0 - 15.0 g/dL   HCT 40.0 36.0 - 46.0 %    Assessment/Plan: 42 y.o. female with complex right ovarian cysts (suspect dermoids) and pelvic pain as well as vaginal polyps and postcoital bleeding, here for a laparoscopic ovarian cystectomy, possible oophorectomy, possible laparotomy, as well as vaginal polypectomy,  - We discussed with patient risks, benefits and alternatives of the procedure to include but not limited to risks of bleeding, infection damage to organs.  We discussed that the ovarian lesion appears simple benign but final pathology will have to be determined after removal of the lesion. We discussed possible risks of spread of malignancy incase of spillage of non- benign cyst contents, risks of removal of ovary, we discussed possible need for a laparotomy.  All her questions were answered and she expressed understanding, consent was signed.   - Admit to [facility]. - NPO and IV fluids.  Archie Endo, MD.  02/09/2023, 3:53 PM

## 2023-02-10 ENCOUNTER — Ambulatory Visit (HOSPITAL_BASED_OUTPATIENT_CLINIC_OR_DEPARTMENT_OTHER)
Admission: RE | Admit: 2023-02-10 | Discharge: 2023-02-10 | Disposition: A | Discharge status: Home / Self Care | Payer: Medicaid Other | Attending: Obstetrics & Gynecology | Admitting: Obstetrics & Gynecology

## 2023-02-10 ENCOUNTER — Ambulatory Visit (HOSPITAL_BASED_OUTPATIENT_CLINIC_OR_DEPARTMENT_OTHER): Payer: Medicaid Other | Admitting: Anesthesiology

## 2023-02-10 ENCOUNTER — Encounter (HOSPITAL_BASED_OUTPATIENT_CLINIC_OR_DEPARTMENT_OTHER): Payer: Self-pay | Admitting: Obstetrics & Gynecology

## 2023-02-10 ENCOUNTER — Other Ambulatory Visit: Payer: Self-pay

## 2023-02-10 ENCOUNTER — Encounter (HOSPITAL_BASED_OUTPATIENT_CLINIC_OR_DEPARTMENT_OTHER)
Admission: RE | Disposition: A | Discharge status: Home / Self Care | Payer: Self-pay | Source: Home / Self Care | Attending: Obstetrics & Gynecology

## 2023-02-10 DIAGNOSIS — Z87891 Personal history of nicotine dependence: Secondary | ICD-10-CM | POA: Insufficient documentation

## 2023-02-10 DIAGNOSIS — N8311 Corpus luteum cyst of right ovary: Secondary | ICD-10-CM | POA: Diagnosis not present

## 2023-02-10 DIAGNOSIS — Z01818 Encounter for other preprocedural examination: Secondary | ICD-10-CM

## 2023-02-10 DIAGNOSIS — N930 Postcoital and contact bleeding: Secondary | ICD-10-CM | POA: Insufficient documentation

## 2023-02-10 DIAGNOSIS — N83291 Other ovarian cyst, right side: Secondary | ICD-10-CM

## 2023-02-10 DIAGNOSIS — I10 Essential (primary) hypertension: Secondary | ICD-10-CM | POA: Diagnosis not present

## 2023-02-10 DIAGNOSIS — R7303 Prediabetes: Secondary | ICD-10-CM | POA: Diagnosis not present

## 2023-02-10 DIAGNOSIS — N8000 Endometriosis of the uterus, unspecified: Secondary | ICD-10-CM | POA: Insufficient documentation

## 2023-02-10 DIAGNOSIS — N80351 Endometriosis of the right pelvic sidewall, unspecified depth: Secondary | ICD-10-CM | POA: Diagnosis not present

## 2023-02-10 DIAGNOSIS — N842 Polyp of vagina: Secondary | ICD-10-CM

## 2023-02-10 DIAGNOSIS — R102 Pelvic and perineal pain: Secondary | ICD-10-CM | POA: Diagnosis not present

## 2023-02-10 HISTORY — PX: LAPAROSCOPIC OVARIAN CYSTECTOMY: SHX6248

## 2023-02-10 HISTORY — DX: Presence of dental prosthetic device (complete) (partial): Z97.2

## 2023-02-10 HISTORY — DX: Prediabetes: R73.03

## 2023-02-10 HISTORY — PX: CERVICAL POLYPECTOMY: SHX88

## 2023-02-10 LAB — POCT I-STAT, CHEM 8
BUN: 22 mg/dL — ABNORMAL HIGH (ref 6–20)
Calcium, Ion: 1.23 mmol/L (ref 1.15–1.40)
Chloride: 106 mmol/L (ref 98–111)
Creatinine, Ser: 0.9 mg/dL (ref 0.44–1.00)
Glucose, Bld: 90 mg/dL (ref 70–99)
HCT: 40 % (ref 36.0–46.0)
Hemoglobin: 13.6 g/dL (ref 12.0–15.0)
Potassium: 4.4 mmol/L (ref 3.5–5.1)
Sodium: 142 mmol/L (ref 135–145)
TCO2: 27 mmol/L (ref 22–32)

## 2023-02-10 LAB — POCT PREGNANCY, URINE: Preg Test, Ur: NEGATIVE

## 2023-02-10 SURGERY — POLYPECTOMY, CERVIX
Anesthesia: General | Site: Vagina

## 2023-02-10 MED ORDER — SODIUM CHLORIDE 0.9 % IR SOLN
Status: DC | PRN
  Administered 2023-02-10: 1000 mL

## 2023-02-10 MED ORDER — ROCURONIUM BROMIDE 10 MG/ML (PF) SYRINGE
PREFILLED_SYRINGE | INTRAVENOUS | Status: AC
  Filled 2023-02-10: qty 10

## 2023-02-10 MED ORDER — MIDAZOLAM HCL 5 MG/5ML IJ SOLN
INTRAMUSCULAR | Status: DC | PRN
  Administered 2023-02-10: 2 mg via INTRAVENOUS

## 2023-02-10 MED ORDER — LIDOCAINE 2% (20 MG/ML) 5 ML SYRINGE
INTRAMUSCULAR | Status: DC | PRN
  Administered 2023-02-10: 100 mg via INTRAVENOUS

## 2023-02-10 MED ORDER — PHENYLEPHRINE 80 MCG/ML (10ML) SYRINGE FOR IV PUSH (FOR BLOOD PRESSURE SUPPORT)
PREFILLED_SYRINGE | INTRAVENOUS | Status: AC
  Filled 2023-02-10: qty 10

## 2023-02-10 MED ORDER — DEXAMETHASONE SODIUM PHOSPHATE 10 MG/ML IJ SOLN
INTRAMUSCULAR | Status: AC
  Filled 2023-02-10: qty 1

## 2023-02-10 MED ORDER — DEXMEDETOMIDINE HCL IN NACL 80 MCG/20ML IV SOLN
INTRAVENOUS | Status: DC | PRN
  Administered 2023-02-10: 12 ug via BUCCAL

## 2023-02-10 MED ORDER — 0.9 % SODIUM CHLORIDE (POUR BTL) OPTIME
TOPICAL | Status: DC | PRN
  Administered 2023-02-10: 500 mL

## 2023-02-10 MED ORDER — FENTANYL CITRATE (PF) 100 MCG/2ML IJ SOLN
INTRAMUSCULAR | Status: DC | PRN
  Administered 2023-02-10: 100 ug via INTRAVENOUS

## 2023-02-10 MED ORDER — DEXAMETHASONE SODIUM PHOSPHATE 10 MG/ML IJ SOLN
INTRAMUSCULAR | Status: DC | PRN
  Administered 2023-02-10: 10 mg via INTRAVENOUS

## 2023-02-10 MED ORDER — SCOPOLAMINE 1 MG/3DAYS TD PT72
1.0000 | MEDICATED_PATCH | TRANSDERMAL | Status: DC
  Administered 2023-02-10: 1.5 mg via TRANSDERMAL

## 2023-02-10 MED ORDER — MIDAZOLAM HCL 2 MG/2ML IJ SOLN
INTRAMUSCULAR | Status: AC
  Filled 2023-02-10: qty 2

## 2023-02-10 MED ORDER — KETOROLAC TROMETHAMINE 30 MG/ML IJ SOLN
INTRAMUSCULAR | Status: DC | PRN
  Administered 2023-02-10: 30 mg via INTRAVENOUS

## 2023-02-10 MED ORDER — BUPIVACAINE-EPINEPHRINE 0.25% -1:200000 IJ SOLN
INTRAMUSCULAR | Status: DC | PRN
  Administered 2023-02-10: 7 mL
  Administered 2023-02-10: 13 mL

## 2023-02-10 MED ORDER — OXYCODONE HCL 5 MG PO TABS: 5.0000 mg | ORAL_TABLET | ORAL | 0 refills | Status: AC | PRN

## 2023-02-10 MED ORDER — ROCURONIUM BROMIDE 10 MG/ML (PF) SYRINGE
PREFILLED_SYRINGE | INTRAVENOUS | Status: DC | PRN
  Administered 2023-02-10: 80 mg via INTRAVENOUS
  Administered 2023-02-10: 10 mg via INTRAVENOUS

## 2023-02-10 MED ORDER — PHENYLEPHRINE 80 MCG/ML (10ML) SYRINGE FOR IV PUSH (FOR BLOOD PRESSURE SUPPORT)
PREFILLED_SYRINGE | INTRAVENOUS | Status: DC | PRN
  Administered 2023-02-10: 80 ug via INTRAVENOUS
  Administered 2023-02-10: 160 ug via INTRAVENOUS
  Administered 2023-02-10 (×4): 80 ug via INTRAVENOUS
  Administered 2023-02-10: 160 ug via INTRAVENOUS
  Administered 2023-02-10: 80 ug via INTRAVENOUS

## 2023-02-10 MED ORDER — SCOPOLAMINE 1 MG/3DAYS TD PT72
MEDICATED_PATCH | TRANSDERMAL | Status: AC
  Filled 2023-02-10: qty 1

## 2023-02-10 MED ORDER — SUGAMMADEX SODIUM 200 MG/2ML IV SOLN
INTRAVENOUS | Status: DC | PRN
  Administered 2023-02-10: 200 mg via INTRAVENOUS

## 2023-02-10 MED ORDER — LIDOCAINE HCL (PF) 2 % IJ SOLN
INTRAMUSCULAR | Status: AC
  Filled 2023-02-10: qty 5

## 2023-02-10 MED ORDER — ONDANSETRON HCL 4 MG/2ML IJ SOLN
INTRAMUSCULAR | Status: DC | PRN
  Administered 2023-02-10: 4 mg via INTRAVENOUS

## 2023-02-10 MED ORDER — OXYCODONE HCL 5 MG PO TABS: 5.0000 mg | ORAL_TABLET | ORAL | 0 refills | Status: DC | PRN

## 2023-02-10 MED ORDER — IBUPROFEN 600 MG PO TABS: 600.0000 mg | ORAL_TABLET | Freq: Four times a day (QID) | ORAL | 0 refills | Status: AC | PRN

## 2023-02-10 MED ORDER — FENTANYL CITRATE (PF) 100 MCG/2ML IJ SOLN
INTRAMUSCULAR | Status: AC
  Filled 2023-02-10: qty 2

## 2023-02-10 MED ORDER — LACTATED RINGERS IV SOLN: INTRAVENOUS | Status: DC

## 2023-02-10 MED ORDER — FENTANYL CITRATE (PF) 100 MCG/2ML IJ SOLN: 25.0000 ug | INTRAMUSCULAR | Status: DC | PRN

## 2023-02-10 MED ORDER — ACETAMINOPHEN 500 MG PO TABS
1000.0000 mg | ORAL_TABLET | Freq: Once | ORAL | Status: AC
  Administered 2023-02-10: 1000 mg via ORAL

## 2023-02-10 MED ORDER — PROPOFOL 10 MG/ML IV BOLUS
INTRAVENOUS | Status: AC
  Filled 2023-02-10: qty 20

## 2023-02-10 MED ORDER — AMISULPRIDE (ANTIEMETIC) 5 MG/2ML IV SOLN: 10.0000 mg | Freq: Once | INTRAVENOUS | Status: DC | PRN

## 2023-02-10 MED ORDER — POVIDONE-IODINE 10 % EX SWAB: 2.0000 | Freq: Once | CUTANEOUS | Status: DC

## 2023-02-10 MED ORDER — DEXMEDETOMIDINE HCL IN NACL 80 MCG/20ML IV SOLN
INTRAVENOUS | Status: AC
  Filled 2023-02-10: qty 20

## 2023-02-10 MED ORDER — PROMETHAZINE HCL 25 MG/ML IJ SOLN: 6.2500 mg | INTRAMUSCULAR | Status: DC | PRN

## 2023-02-10 MED ORDER — ACETAMINOPHEN 500 MG PO TABS
ORAL_TABLET | ORAL | Status: AC
  Filled 2023-02-10: qty 2

## 2023-02-10 MED ORDER — PHENYLEPHRINE HCL-NACL 20-0.9 MG/250ML-% IV SOLN
INTRAVENOUS | Status: DC | PRN
  Administered 2023-02-10: 40 ug/min via INTRAVENOUS

## 2023-02-10 MED ORDER — PROPOFOL 10 MG/ML IV BOLUS
INTRAVENOUS | Status: DC | PRN
  Administered 2023-02-10: 200 mg via INTRAVENOUS

## 2023-02-10 MED ORDER — ONDANSETRON HCL 4 MG/2ML IJ SOLN
INTRAMUSCULAR | Status: AC
  Filled 2023-02-10: qty 2

## 2023-02-10 SURGICAL SUPPLY — 53 items
ADH SKN CLS APL DERMABOND .7 (GAUZE/BANDAGES/DRESSINGS) ×3
APL SKNCLS STERI-STRIP NONHPOA (GAUZE/BANDAGES/DRESSINGS)
BAG SPEC RTRVL LRG 6X4 10 (ENDOMECHANICALS) ×3
BENZOIN TINCTURE PRP APPL 2/3 (GAUZE/BANDAGES/DRESSINGS) IMPLANT
CABLE HIGH FREQUENCY MONO STRZ (ELECTRODE) ×1 IMPLANT
COVER MAYO STAND STRL (DRAPES) ×3 IMPLANT
DERMABOND ADVANCED .7 DNX12 (GAUZE/BANDAGES/DRESSINGS) ×1 IMPLANT
DRSG OPSITE POSTOP 3X4 (GAUZE/BANDAGES/DRESSINGS) IMPLANT
DURAPREP 26ML APPLICATOR (WOUND CARE) ×3 IMPLANT
ELECT REM PT RETURN 9FT ADLT (ELECTROSURGICAL) ×3
ELECTRODE REM PT RTRN 9FT ADLT (ELECTROSURGICAL) ×3 IMPLANT
GAUZE 4X4 16PLY ~~LOC~~+RFID DBL (SPONGE) IMPLANT
GLOVE BIO SURGEON STRL SZ 6.5 (GLOVE) ×3 IMPLANT
GLOVE BIOGEL PI IND STRL 6.5 (GLOVE) ×6 IMPLANT
GLOVE BIOGEL PI IND STRL 7.0 (GLOVE) ×3 IMPLANT
GLOVE SURG SS PI 6.5 STRL IVOR (GLOVE) ×6 IMPLANT
GOWN STRL REUS W/TWL LRG LVL3 (GOWN DISPOSABLE) ×6 IMPLANT
HEMOSTAT ARISTA ABSORB 3G PWDR (HEMOSTASIS) IMPLANT
IRRIGATION STRYKERFLOW (MISCELLANEOUS) ×1 IMPLANT
IRRIGATOR STRYKERFLOW (MISCELLANEOUS) ×3
KIT TURNOVER CYSTO (KITS) ×3 IMPLANT
LIGASURE VESSEL 5MM BLUNT TIP (ELECTROSURGICAL) ×3 IMPLANT
NDL INSUFFLATION 14GA 120MM (NEEDLE) ×2 IMPLANT
NEEDLE INSUFFLATION 14GA 120MM (NEEDLE) ×3 IMPLANT
NS IRRIG 500ML POUR BTL (IV SOLUTION) ×3 IMPLANT
PACK LAPAROSCOPY BASIN (CUSTOM PROCEDURE TRAY) ×3 IMPLANT
PACK VAGINAL WOMENS (CUSTOM PROCEDURE TRAY) ×3 IMPLANT
PAD OB MATERNITY 4.3X12.25 (PERSONAL CARE ITEMS) ×3 IMPLANT
PAD PREP 24X48 CUFFED NSTRL (MISCELLANEOUS) ×3 IMPLANT
POUCH SPECIMEN RETRIEVAL 10MM (ENDOMECHANICALS) ×1 IMPLANT
SCISSORS LAP 5X35 DISP (ENDOMECHANICALS) ×1 IMPLANT
SET SUCTION IRRIG HYDROSURG (IRRIGATION / IRRIGATOR) ×1 IMPLANT
SET TUBE SMOKE EVAC HIGH FLOW (TUBING) ×3 IMPLANT
SLEEVE SCD COMPRESS KNEE MED (STOCKING) ×3 IMPLANT
SLEEVE Z-THREAD 5X100MM (TROCAR) ×6 IMPLANT
SPIKE FLUID TRANSFER (MISCELLANEOUS) ×3 IMPLANT
SPONGE SURGIFOAM ABS GEL 12-7 (HEMOSTASIS) IMPLANT
SUT MNCRL AB 4-0 PS2 18 (SUTURE) ×3 IMPLANT
SUT VIC AB 3-0 CT1 27 (SUTURE) ×3
SUT VIC AB 3-0 CT1 TAPERPNT 27 (SUTURE) ×1 IMPLANT
SUT VIC AB 3-0 PS2 18 (SUTURE)
SUT VIC AB 3-0 PS2 18XBRD (SUTURE) IMPLANT
SUT VIC AB 3-0 SH 27 (SUTURE)
SUT VIC AB 3-0 SH 27X BRD (SUTURE) IMPLANT
SUT VICRYL 0 UR6 27IN ABS (SUTURE) ×3 IMPLANT
SWAB COLLECTION DEVICE MRSA (MISCELLANEOUS) IMPLANT
SWAB CULTURE ESWAB REG 1ML (MISCELLANEOUS) IMPLANT
SYS BAG RETRIEVAL 10MM (BASKET) ×3
SYSTEM BAG RETRIEVAL 10MM (BASKET) ×1 IMPLANT
TOWEL OR 17X24 6PK STRL BLUE (TOWEL DISPOSABLE) ×3 IMPLANT
TRAY FOLEY W/BAG SLVR 14FR LF (SET/KITS/TRAYS/PACK) ×3 IMPLANT
TROCAR Z-THREAD FIOS 11X100 BL (TROCAR) IMPLANT
WARMER LAPAROSCOPE (MISCELLANEOUS) ×3 IMPLANT

## 2023-02-10 NOTE — Transfer of Care (Signed)
Immediate Anesthesia Transfer of Care Note  Patient: Victoria Harvey  Procedure(s) Performed: VAGINAL POLYPECTOMY (Vagina ) LAPAROSCOPIC OVARIAN CYSTECTOMY (Pelvis)  Patient Location: PACU  Anesthesia Type:General  Level of Consciousness: drowsy, patient cooperative, and responds to stimulation  Airway & Oxygen Therapy: Patient Spontanous Breathing and Patient connected to face mask oxygen  Post-op Assessment: Report given to RN and Post -op Vital signs reviewed and stable  Post vital signs: Reviewed and stable  Last Vitals:  Vitals Value Taken Time  BP 128/79 02/10/23 1324  Temp 36.5 C 02/10/23 1324  Pulse 98 02/10/23 1327  Resp 20 02/10/23 1327  SpO2 100 % 02/10/23 1327  Vitals shown include unvalidated device data.  Last Pain:  Vitals:   02/10/23 0816  TempSrc: Oral  PainSc: 0-No pain      Patients Stated Pain Goal: 5 ([id number])  Complications: No notable events documented.

## 2023-02-10 NOTE — Op Note (Addendum)
Palma Holter. Achorn DOB: 1981/01/09 MRN: [id number] Date of Procedure: 02/10/2023    PREOP DIAGNOSIS:  1. Right ovary with two complex cysts, suspect dermoid.  2. Pelvic pain. 3. Vaginal polyps. 4. Postcoital bleeding.    POSTOP DIAGNOSIS:  Right ovary with three complex cysts, consistent with dermoids. A small powder burn lesion on lower uterine segment of uterus and two small powder burn lesions on the right anterior pelvic wall consistent with endometriosis.   Pelvic pain. Vaginal Polyps. Postcoital bleeding.   PROCEDURES: Laparoscopic right ovarian cystectomies. Endometriosis lesions excisions.    Vaginal polypectomy.     SURGEON: Dr. Waymon Amato.   ASSISTANT: Chasity, RNFA.   SURGEON ATTESTATION:  An experienced assistant was required for the laparoscopy portion of the procedure given the standard of surgical care and given the complexity of the case. This assistant was needed for camera maneuvering, exposure, instrument exchange and for overall help during the procedure.    ANESTHESIA: General endotracheal anesthesia and local anesthesia by Surgeon.    COMPLICATIONS: None.   EBL: 60 cc.  IV Fluids: 1500 cc LR  Urine output: 175 cc concentrated urine    FINDINGS: Normal uterus.  Right ovary with three ovarian cysts consistent with dermoids, each of them about 3 cm in size.  Absent bilateral fallopian tubes.  Absent left ovary.  Normal liver edge. Normal appendix. Normal bowel and stomach. Vagina with two polypoid lesions on right posterior vaginal wall, each about 1 to 2 cm in size.       PROCEDURE:  Informed consent was obtained from the patient to undergo the procedures after discussing the risks benefits and alternatives of the procedure. She was taken to the operating room where anesthesia was administered without difficulty. Both arms were tucked and she was placed in the dorsal lithotomy position on the Pink Pad Trendelenburg Positioning system. A pelvic exam was done  with above findings.  She was prepped abdominally and vaginally in the usual sterile fashion. Foley catheter was placed in the bladder and Acorn manipulator was placed in the cervix.      Attention was then turned to the abdomen where 0.25% marcaine with 1: 200,000 epinephrine was instilled in the infraumbilical area, total amount used for the laparoscopy portion was 13 cc.  The infraumbilical skin was incised with a scalpel and Veress needle inserted.  Correct placement was confirmed with normal saline irrigation and suction tests as well as hanging drop tests.  Abdomen was insufflated with gas with low initial pressures noted.  The  11 mm [employer] blade less trocar was then used to enter the abdomen under visualization after extending the infraumbilical skin incision.  Abdominal pressure was maintained at 15 mmHg for the procedure.  Patient was placed in trendelenburg position.  More local anesthesia was given at incision areas and then two 5 mm ports were placed on the left side of the abdomen under visualization, one about 10 cm lateral to umbilicus and another one 3 cm medial and superior to the superior anterior iliac spine using the [employer] bladeless trocars under direct visualization.  The abdomen and pelvis were inspected with the above findings.  The right ovary cortex was incised with the endo shears at 30 watt current and the incision extended with endo shears without energy.  The ovarian cortex was then separated from the underlying cyst wall using countertraction using the suction irrigator, endo shears, million dollar and cobra graspers to release the cyst from its sorrounding ovarian tissue, cyst  remained intact and did not rupture.    A second cyst located below the first one was similarly excised intact. This one was noted to have adhesions to the ovarian cortex and therefore endo shears were used to excise it.  A third cyst medial to second one was similarly excised intact.  The  cysts wall were then placed in the 62m Endo pouch bag under visualization.  The lesions were then brought to the abdominal surface in the endo pouch bag after removal of the 11 mm port .  The endo pouch bag inadvertently ruptured as while being maneuvered on the abdomen wall.  The endopouch bag was removed and noted to have a small hole on it but otherwise was intact.  The three ovarian cyst lesions were located on the abdomen and still found to be intact.  A second 10 mm endo pouch bag was used to retrieve the lesions. The endo pouch bag was brought to to the abdominal incision.  The cysts were identified and punctured  with sebaceous material and hair released and removed.  The cysts walls and contents were then delivered with use of kocher graspers and gentle traction of the tissue.  The abdomen was re insufflated and pelvis was irrigated and suctioned out. Good hemostasis was noted on the right ovary.  The powder burn lesions on anterior uterus and right anterior pelvic wall were found to be superficial and were easily removed with Million dollar graspers.  Hemostasis on the right ovary  was enhanced with Arista powder placed over the remaining ovarian cortex with good hemostasis noted over the ovarian cortex.  Gas was turned off and allowed to escape. The 5 mm ports were then removed under direct visualization after patient received five manual inflations.  The umbilical port was removed with visualization.   Umbilical incision was closed using 0 Vicryl Suture after grasping the fascia with Kocher clamps.   All the skin incisions were closed with 4-0 Monocryl then closed with dermabond.   Attention was then turned to the vagina where the foley catheter and the Acorn manipulator were removed.  A speculum was placed in the vaginal and vaginal lesions were noted as above.  Marcaine with epinephrine was instilled below the lesions, 6 cc used and the # 10 blade used to excise the lesions.  3-0 vicryl was then used  to close the defect on the posterior vaginal wall. Excellent hemostasis was noted.  The patient was then awoken from anesthesia and she was taken to recovery room in stable condition   SPECIMENS:  Right ovarian cyst walls and contents, suspect dermoids. Vaginal polyps.   Ravenne Wayment KAlesia Richards MD.  DATE: 02/10/2023.

## 2023-02-10 NOTE — Interval H&P Note (Signed)
History and Physical Interval Note:  02/10/2023 9:40 AM  Victoria Harvey  has presented today for surgery, with the diagnosis of POLYP OF VAGINA AND OVARIAN CYSTS.  The various methods of treatment have been discussed with the patient and family. After consideration of risks, benefits and other options for treatment, the patient has consented to  Procedure(s): VAGINAL POLYPECTOMY (N/A) LAPAROSCOPIC OVARIAN CYSTECTOMY (N/A) POSSIBLE OOPHORECTOMY AND PELVIC WASING (N/A) as a surgical intervention.  The patient's history has been reviewed, patient examined, no change in status, stable for surgery.  I have reviewed the patient's chart and labs.  Questions were answered to the patient's satisfaction.    Archie Endo, MD.

## 2023-02-10 NOTE — Discharge Instructions (Signed)
   Victoria Harvey,  You may shower as usual, keeping water exposure over the abdomen to a minimal and pat the incisions dry after showering. Do not apply soap directly to the incisions or rub the incisions directly.       You may see a sticky substance over the incisions, this is glue used for the incisions and will fall off by itself.   3.  No tampons or douching or vaginal intercourse after the procedure until at least two weeks after the surgery and after having been seen at the office.      4. Expect some light to moderate vaginal bleeding for the next 2 to 7 days but do let me know if with heavy bleeding requiring you to change a pad every two hours or less.   5. You may have some back pain because of the gas used during the procedure, this should improve with time as you ambulate regularly.   6. Please ambulate regularly at least 1 hour every day in addition to your activities of daily living.   7.  You may see bruising around and near the incisions, this  will usually improve and resolve over time.  Let me know however if it is expanding or worsening.   8.  Call me if with excessive pain not improving with medication use, excessive vaginal bleeding or fevers or chills or any other concerns.    9. Use pain medication of tylenol over the counter as well as ordered ibuprofen and oxycodone for pain as needed. Please note that Ibuprofen may increase your risk of stomach ulcers.  Please ensure that you take ibuprofen after a meal and look out for symptoms of stomach ulcers such as uncontrolled pain in mid abdomen region. Stop the ibuprofen if with such symptoms.  10.  Do not do any heavy lifting for the next 2 weeks, I.e nothing more then 30 lbs.   11. You may return to work between 2 days to 2 weeks depending on how you feel after the surgery.     I wish you a quick recovery,  Dr. Alesia Richards Phone: [phone] extension [phone] if regular business hours.   If After regular hours: call  office  number and press the extension to speak to provider on call.

## 2023-02-10 NOTE — Anesthesia Procedure Notes (Signed)
Procedure Name: Intubation Date/Time: 02/10/2023 10:28 AM  Performed by: Rogers Blocker, CRNAPre-anesthesia Checklist: Patient identified, Emergency Drugs available, Suction available and Patient being monitored Patient Re-evaluated:Patient Re-evaluated prior to induction Oxygen Delivery Method: Circle System Utilized Preoxygenation: Pre-oxygenation with 100% oxygen Induction Type: IV induction Ventilation: Mask ventilation without difficulty Laryngoscope Size: Mac and 3 Grade View: Grade I Tube type: Oral Tube size: 7.0 mm Number of attempts: 1 Airway Equipment and Method: Stylet and Bite block Placement Confirmation: ETT inserted through vocal cords under direct vision, positive ETCO2 and breath sounds checked- equal and bilateral Secured at: 22 cm Tube secured with: Tape Dental Injury: Teeth and Oropharynx as per pre-operative assessment

## 2023-02-10 NOTE — Anesthesia Postprocedure Evaluation (Signed)
Anesthesia Post Note  Patient: CASSI BONEY  Procedure(s) Performed: VAGINAL POLYPECTOMY (Vagina ) LAPAROSCOPIC OVARIAN CYSTECTOMY (Pelvis)     Patient location during evaluation: PACU Anesthesia Type: General Level of consciousness: sedated Pain management: pain level controlled Vital Signs Assessment: post-procedure vital signs reviewed and stable Respiratory status: spontaneous breathing and respiratory function stable Cardiovascular status: stable Postop Assessment: no apparent nausea or vomiting Anesthetic complications: no   No notable events documented.  Last Vitals:  Vitals:   02/10/23 1415 02/10/23 1454  BP:  120/70  Pulse:  76  Resp:  16  Temp: 36.6 C (!) 36.3 C  SpO2:  100%    Last Pain:  Vitals:   02/10/23 1454  TempSrc: Axillary  PainSc:                  Xzander Gilham DANIEL

## 2023-02-11 ENCOUNTER — Encounter (HOSPITAL_BASED_OUTPATIENT_CLINIC_OR_DEPARTMENT_OTHER): Payer: Self-pay | Admitting: Obstetrics & Gynecology

## 2023-02-11 LAB — SURGICAL PATHOLOGY

## 2023-02-25 DIAGNOSIS — D270 Benign neoplasm of right ovary: Secondary | ICD-10-CM | POA: Diagnosis not present

## 2023-02-25 DIAGNOSIS — Z09 Encounter for follow-up examination after completed treatment for conditions other than malignant neoplasm: Secondary | ICD-10-CM | POA: Diagnosis not present

## 2023-02-25 DIAGNOSIS — N842 Polyp of vagina: Secondary | ICD-10-CM | POA: Diagnosis not present

## 2023-03-03 DIAGNOSIS — Z419 Encounter for procedure for purposes other than remedying health state, unspecified: Secondary | ICD-10-CM | POA: Diagnosis not present

## 2023-03-13 DIAGNOSIS — I10 Essential (primary) hypertension: Secondary | ICD-10-CM | POA: Diagnosis not present

## 2023-03-13 DIAGNOSIS — Z713 Dietary counseling and surveillance: Secondary | ICD-10-CM | POA: Diagnosis not present

## 2023-03-13 DIAGNOSIS — E7889 Other lipoprotein metabolism disorders: Secondary | ICD-10-CM | POA: Diagnosis not present

## 2023-03-13 DIAGNOSIS — Z1159 Encounter for screening for other viral diseases: Secondary | ICD-10-CM | POA: Diagnosis not present

## 2023-03-13 DIAGNOSIS — D649 Anemia, unspecified: Secondary | ICD-10-CM | POA: Diagnosis not present

## 2023-03-13 DIAGNOSIS — E559 Vitamin D deficiency, unspecified: Secondary | ICD-10-CM | POA: Diagnosis not present

## 2023-03-13 DIAGNOSIS — Z6828 Body mass index (BMI) 28.0-28.9, adult: Secondary | ICD-10-CM | POA: Diagnosis not present

## 2023-03-13 DIAGNOSIS — Z114 Encounter for screening for human immunodeficiency virus [HIV]: Secondary | ICD-10-CM | POA: Diagnosis not present

## 2023-03-13 DIAGNOSIS — E569 Vitamin deficiency, unspecified: Secondary | ICD-10-CM | POA: Diagnosis not present

## 2023-03-13 DIAGNOSIS — G4700 Insomnia, unspecified: Secondary | ICD-10-CM | POA: Diagnosis not present

## 2023-03-28 DIAGNOSIS — D270 Benign neoplasm of right ovary: Secondary | ICD-10-CM | POA: Diagnosis not present

## 2023-03-28 DIAGNOSIS — N842 Polyp of vagina: Secondary | ICD-10-CM | POA: Diagnosis not present

## 2023-03-28 DIAGNOSIS — Z09 Encounter for follow-up examination after completed treatment for conditions other than malignant neoplasm: Secondary | ICD-10-CM | POA: Diagnosis not present

## 2023-03-28 DIAGNOSIS — L723 Sebaceous cyst: Secondary | ICD-10-CM | POA: Diagnosis not present

## 2023-04-02 DIAGNOSIS — Z419 Encounter for procedure for purposes other than remedying health state, unspecified: Secondary | ICD-10-CM | POA: Diagnosis not present

## 2023-04-23 DIAGNOSIS — N930 Postcoital and contact bleeding: Secondary | ICD-10-CM | POA: Diagnosis not present

## 2023-04-23 DIAGNOSIS — R87810 Cervical high risk human papillomavirus (HPV) DNA test positive: Secondary | ICD-10-CM | POA: Diagnosis not present

## 2023-04-23 DIAGNOSIS — Z124 Encounter for screening for malignant neoplasm of cervix: Secondary | ICD-10-CM | POA: Diagnosis not present

## 2023-05-03 DIAGNOSIS — Z419 Encounter for procedure for purposes other than remedying health state, unspecified: Secondary | ICD-10-CM | POA: Diagnosis not present

## 2023-05-05 DIAGNOSIS — I10 Essential (primary) hypertension: Secondary | ICD-10-CM | POA: Diagnosis not present

## 2023-05-05 DIAGNOSIS — Z713 Dietary counseling and surveillance: Secondary | ICD-10-CM | POA: Diagnosis not present

## 2023-05-05 DIAGNOSIS — Z6827 Body mass index (BMI) 27.0-27.9, adult: Secondary | ICD-10-CM | POA: Diagnosis not present

## 2023-05-06 DIAGNOSIS — N930 Postcoital and contact bleeding: Secondary | ICD-10-CM | POA: Diagnosis not present

## 2023-05-06 DIAGNOSIS — N72 Inflammatory disease of cervix uteri: Secondary | ICD-10-CM | POA: Diagnosis not present

## 2023-05-22 DIAGNOSIS — R87810 Cervical high risk human papillomavirus (HPV) DNA test positive: Secondary | ICD-10-CM | POA: Diagnosis not present

## 2023-05-22 DIAGNOSIS — N930 Postcoital and contact bleeding: Secondary | ICD-10-CM | POA: Diagnosis not present

## 2023-06-02 DIAGNOSIS — Z419 Encounter for procedure for purposes other than remedying health state, unspecified: Secondary | ICD-10-CM | POA: Diagnosis not present

## 2023-07-03 DIAGNOSIS — Z419 Encounter for procedure for purposes other than remedying health state, unspecified: Secondary | ICD-10-CM | POA: Diagnosis not present

## 2023-07-08 DIAGNOSIS — E7889 Other lipoprotein metabolism disorders: Secondary | ICD-10-CM | POA: Diagnosis not present

## 2023-07-08 DIAGNOSIS — Z713 Dietary counseling and surveillance: Secondary | ICD-10-CM | POA: Diagnosis not present

## 2023-07-08 DIAGNOSIS — I10 Essential (primary) hypertension: Secondary | ICD-10-CM | POA: Diagnosis not present

## 2023-07-08 DIAGNOSIS — Z6828 Body mass index (BMI) 28.0-28.9, adult: Secondary | ICD-10-CM | POA: Diagnosis not present

## 2023-08-03 DIAGNOSIS — Z419 Encounter for procedure for purposes other than remedying health state, unspecified: Secondary | ICD-10-CM | POA: Diagnosis not present

## 2023-08-13 DIAGNOSIS — N930 Postcoital and contact bleeding: Secondary | ICD-10-CM | POA: Diagnosis not present

## 2023-08-13 DIAGNOSIS — Z713 Dietary counseling and surveillance: Secondary | ICD-10-CM | POA: Diagnosis not present

## 2023-08-13 DIAGNOSIS — Z90721 Acquired absence of ovaries, unilateral: Secondary | ICD-10-CM | POA: Diagnosis not present

## 2023-08-13 DIAGNOSIS — G4700 Insomnia, unspecified: Secondary | ICD-10-CM | POA: Diagnosis not present

## 2023-08-13 DIAGNOSIS — Z9079 Acquired absence of other genital organ(s): Secondary | ICD-10-CM | POA: Diagnosis not present

## 2023-08-13 DIAGNOSIS — Z6828 Body mass index (BMI) 28.0-28.9, adult: Secondary | ICD-10-CM | POA: Diagnosis not present

## 2023-08-13 DIAGNOSIS — I10 Essential (primary) hypertension: Secondary | ICD-10-CM | POA: Diagnosis not present

## 2023-09-02 DIAGNOSIS — Z419 Encounter for procedure for purposes other than remedying health state, unspecified: Secondary | ICD-10-CM | POA: Diagnosis not present

## 2023-09-12 DIAGNOSIS — Z713 Dietary counseling and surveillance: Secondary | ICD-10-CM | POA: Diagnosis not present

## 2023-09-12 DIAGNOSIS — Z6826 Body mass index (BMI) 26.0-26.9, adult: Secondary | ICD-10-CM | POA: Diagnosis not present

## 2023-10-03 DIAGNOSIS — Z419 Encounter for procedure for purposes other than remedying health state, unspecified: Secondary | ICD-10-CM | POA: Diagnosis not present

## 2023-10-15 DIAGNOSIS — Z90721 Acquired absence of ovaries, unilateral: Secondary | ICD-10-CM | POA: Diagnosis not present

## 2023-10-15 DIAGNOSIS — R87810 Cervical high risk human papillomavirus (HPV) DNA test positive: Secondary | ICD-10-CM | POA: Diagnosis not present

## 2023-10-15 DIAGNOSIS — N926 Irregular menstruation, unspecified: Secondary | ICD-10-CM | POA: Diagnosis not present

## 2023-10-15 DIAGNOSIS — N930 Postcoital and contact bleeding: Secondary | ICD-10-CM | POA: Diagnosis not present

## 2023-10-15 DIAGNOSIS — Z9851 Tubal ligation status: Secondary | ICD-10-CM | POA: Diagnosis not present

## 2023-10-15 DIAGNOSIS — N941 Unspecified dyspareunia: Secondary | ICD-10-CM | POA: Diagnosis not present

## 2023-10-15 DIAGNOSIS — N946 Dysmenorrhea, unspecified: Secondary | ICD-10-CM | POA: Diagnosis not present

## 2023-10-15 DIAGNOSIS — N809 Endometriosis, unspecified: Secondary | ICD-10-CM | POA: Diagnosis not present

## 2023-11-02 DIAGNOSIS — Z419 Encounter for procedure for purposes other than remedying health state, unspecified: Secondary | ICD-10-CM | POA: Diagnosis not present

## 2023-11-07 DIAGNOSIS — Z7189 Other specified counseling: Secondary | ICD-10-CM | POA: Diagnosis not present

## 2023-11-07 DIAGNOSIS — Z87898 Personal history of other specified conditions: Secondary | ICD-10-CM | POA: Diagnosis not present

## 2023-11-07 DIAGNOSIS — E7889 Other lipoprotein metabolism disorders: Secondary | ICD-10-CM | POA: Diagnosis not present

## 2023-11-07 DIAGNOSIS — I10 Essential (primary) hypertension: Secondary | ICD-10-CM | POA: Diagnosis not present

## 2023-11-07 DIAGNOSIS — Z0000 Encounter for general adult medical examination without abnormal findings: Secondary | ICD-10-CM | POA: Diagnosis not present

## 2023-11-19 DIAGNOSIS — N946 Dysmenorrhea, unspecified: Secondary | ICD-10-CM | POA: Diagnosis not present

## 2023-11-19 DIAGNOSIS — N930 Postcoital and contact bleeding: Secondary | ICD-10-CM | POA: Diagnosis not present

## 2023-11-19 DIAGNOSIS — R87810 Cervical high risk human papillomavirus (HPV) DNA test positive: Secondary | ICD-10-CM | POA: Diagnosis not present

## 2023-11-19 DIAGNOSIS — N809 Endometriosis, unspecified: Secondary | ICD-10-CM | POA: Diagnosis not present

## 2023-11-19 DIAGNOSIS — N952 Postmenopausal atrophic vaginitis: Secondary | ICD-10-CM | POA: Diagnosis not present

## 2023-11-19 DIAGNOSIS — Z3202 Encounter for pregnancy test, result negative: Secondary | ICD-10-CM | POA: Diagnosis not present

## 2023-12-03 DIAGNOSIS — Z419 Encounter for procedure for purposes other than remedying health state, unspecified: Secondary | ICD-10-CM | POA: Diagnosis not present

## 2024-01-03 DIAGNOSIS — Z419 Encounter for procedure for purposes other than remedying health state, unspecified: Secondary | ICD-10-CM | POA: Diagnosis not present

## 2024-01-13 DIAGNOSIS — Z6826 Body mass index (BMI) 26.0-26.9, adult: Secondary | ICD-10-CM | POA: Diagnosis not present

## 2024-01-13 DIAGNOSIS — Z713 Dietary counseling and surveillance: Secondary | ICD-10-CM | POA: Diagnosis not present

## 2024-01-17 ENCOUNTER — Encounter (HOSPITAL_BASED_OUTPATIENT_CLINIC_OR_DEPARTMENT_OTHER): Payer: Self-pay

## 2024-01-17 ENCOUNTER — Emergency Department (HOSPITAL_BASED_OUTPATIENT_CLINIC_OR_DEPARTMENT_OTHER)
Admission: EM | Admit: 2024-01-17 | Discharge: 2024-01-17 | Disposition: A | Discharge status: Home / Self Care | Payer: Medicaid Other | Attending: Emergency Medicine | Admitting: Emergency Medicine

## 2024-01-17 ENCOUNTER — Other Ambulatory Visit: Payer: Self-pay

## 2024-01-17 DIAGNOSIS — Z20822 Contact with and (suspected) exposure to covid-19: Secondary | ICD-10-CM | POA: Diagnosis not present

## 2024-01-17 DIAGNOSIS — R519 Headache, unspecified: Secondary | ICD-10-CM | POA: Diagnosis present

## 2024-01-17 DIAGNOSIS — Z8616 Personal history of COVID-19: Secondary | ICD-10-CM | POA: Diagnosis not present

## 2024-01-17 DIAGNOSIS — J0100 Acute maxillary sinusitis, unspecified: Secondary | ICD-10-CM | POA: Insufficient documentation

## 2024-01-17 LAB — RESP PANEL BY RT-PCR (RSV, FLU A&B, COVID)  RVPGX2
Influenza A by PCR: NEGATIVE
Influenza B by PCR: NEGATIVE
Resp Syncytial Virus by PCR: NEGATIVE
SARS Coronavirus 2 by RT PCR: NEGATIVE

## 2024-01-17 NOTE — Discharge Instructions (Signed)
 You have been seen today for your complaint of sinus pain. Your lab work was negative for flu, COVID, RSV. Your discharge medications include Claritin and Flonase.  Take these daily. Home care instructions are as follows:  Perform saline rinses Follow up with: Your primary care provider in 1 week Please seek immediate medical care if you develop any of the following symptoms: You have a severe headache. You have persistent vomiting. You have severe pain or swelling around your face or eyes. You have vision problems. You develop confusion. Your neck is stiff. You have trouble breathing. At this time there does not appear to be the presence of an emergent medical condition, however there is always the potential for conditions to change. Please read and follow the below instructions.  Do not take your medicine if  develop an itchy rash, swelling in your mouth or lips, or difficulty breathing; call 911 and seek immediate emergency medical attention if this occurs.  You may review your lab tests and imaging results in their entirety on your MyChart account.  Please discuss all results of fully with your primary care provider and other specialist at your follow-up visit.  Note: Portions of this text may have been transcribed using voice recognition software. Every effort was made to ensure accuracy; however, inadvertent computerized transcription errors may still be present.

## 2024-01-17 NOTE — ED Provider Notes (Signed)
 [place] EMERGENCY DEPARTMENT AT [facility] Provider Note   CSN: [id number] Arrival date & time: 01/17/24  1853     History  Chief Complaint  Patient presents with   Facial Pain    Victoria Harvey is a 43 y.o. female.  With a history of hypertension presenting to the ED for evaluation of left-sided maxillary sinus pain.  Symptoms began 2 to 3 days ago.  No known sick contacts.  She has been using DayQuil and NyQuil for symptoms with minimal improvement.  She reports congestion and rhinorrhea as well.  No fevers or chills.  No cough or shortness of breath.  No sore throat.  No headaches.  HPI     Home Medications Prior to Admission medications   Medication Sig Start Date End Date Taking? Authorizing Provider  hydrochlorothiazide (HYDRODIURIL) 25 MG tablet Take 25 mg by mouth daily.    [provider]  ibuprofen (ADVIL) 600 MG tablet Take 1 tablet (600 mg total) by mouth every 6 (six) hours as needed. 02/10/23   Hoover Browns, MD  Multiple Vitamin (MULTIVITAMIN WITH MINERALS) TABS tablet Take 1 tablet by mouth daily.    [provider]  oxyCODONE (ROXICODONE) 5 MG immediate release tablet Take 1 tablet (5 mg total) by mouth every 4 (four) hours as needed for severe pain. 02/10/23   Hoover Browns, MD  valsartan (DIOVAN) 80 MG tablet Take 80 mg by mouth daily.    [provider]  Vitamin D, Ergocalciferol, (DRISDOL) 1.25 MG (50000 UNIT) CAPS capsule Take 50,000 Units by mouth every 7 (seven) days.    [provider]      Allergies    Levofloxacin    Review of Systems   Review of Systems  HENT:  Positive for congestion, rhinorrhea and sinus pain.   All other systems reviewed and are negative.   Physical Exam Updated Vital Signs BP (!) 148/88 (BP Location: Right Arm)   Pulse 81   Temp 98.1 F (36.7 C)   Resp 16   Ht 5\' 7"  (1.702 m)   Wt 74.8 kg   SpO2 100%   BMI 25.84 kg/m  Physical Exam Vitals and nursing note reviewed.   Constitutional:      General: She is not in acute distress.    Appearance: Normal appearance. She is normal weight. She is not ill-appearing.  HENT:     Head: Normocephalic and atraumatic.     Nose: Congestion and rhinorrhea present.     Comments: bilateral nasal turbinate swelling.  Mild TTP to left maxillary sinus.  Sinus does not illuminate. Pulmonary:     Effort: Pulmonary effort is normal. No respiratory distress.  Abdominal:     General: Abdomen is flat.  Musculoskeletal:        General: Normal range of motion.     Cervical back: Neck supple.  Skin:    General: Skin is warm and dry.  Neurological:     Mental Status: She is alert and oriented to person, place, and time.  Psychiatric:        Mood and Affect: Mood normal.        Behavior: Behavior normal.     ED Results / Procedures / Treatments   Labs (all labs ordered are listed, but only abnormal results are displayed) Labs Reviewed  RESP PANEL BY RT-PCR (RSV, FLU A&B, COVID)  [id number]    EKG None  Radiology No results found.  Procedures Procedures    Medications Ordered in  ED Medications - No data to display  ED Course/ Medical Decision Making/ A&P                                 Medical Decision Making This patient presents to the ED for concern of sinus pain, this involves an extensive number of treatment options, and is a complaint that carries with it a high risk of complications and morbidity.  The differential diagnosis includes sinusitis, meningitis, flu, COVID, RSV  My initial workup includes respiratory panel positive  Additional history obtained from: Nursing notes from this visit.  I ordered, reviewed and interpreted labs which include: Respiratory panel.  Negative.  Afebrile, hemodynamically stable.  43 year old female presenting to the ED for evaluation of left-sided maxillary sinus pain.  She reports congestion and rhinorrhea as well.  She appears very well on physical exam.  Left  maxillary sinus does not illuminate.  Mild tenderness to palpation of the left maxillary sinus.  She has been using DayQuil and NyQuil for her symptoms.  Suspect viral sinusitis.  She was educated to use Claritin and Flonase for symptoms as well as nasal rinses or Nettie Potts.  She was encouraged to follow-up with her primary care provider in 1 week.  She was given return precautions.  Stable at discharge  At this time there does not appear to be any evidence of an acute emergency medical condition and the patient appears stable for discharge with appropriate outpatient follow up. Diagnosis was discussed with patient who verbalizes understanding of care plan and is agreeable to discharge. I have discussed return precautions with patient who verbalizes understanding. Patient encouraged to follow-up with their PCP within 1 week. All questions answered.  Note: Portions of this report may have been transcribed using voice recognition software. Every effort was made to ensure accuracy; however, inadvertent computerized transcription errors may still be present.        Final Clinical Impression(s) / ED Diagnoses Final diagnoses:  Acute non-recurrent maxillary sinusitis    Rx / DC Orders ED Discharge Orders     None         Mora Bellman 01/17/24 2127    Terald Sleeper, MD 01/17/24 [phone]

## 2024-01-17 NOTE — ED Triage Notes (Signed)
 Patient arrives with complaints of facial pain (more pain on the left side of her nose) x2 days. Rates her pain an 8/10.

## 2024-01-31 DIAGNOSIS — Z419 Encounter for procedure for purposes other than remedying health state, unspecified: Secondary | ICD-10-CM | POA: Diagnosis not present

## 2024-03-13 DIAGNOSIS — Z419 Encounter for procedure for purposes other than remedying health state, unspecified: Secondary | ICD-10-CM | POA: Diagnosis not present

## 2024-04-12 DIAGNOSIS — Z419 Encounter for procedure for purposes other than remedying health state, unspecified: Secondary | ICD-10-CM | POA: Diagnosis not present

## 2024-05-13 DIAGNOSIS — Z419 Encounter for procedure for purposes other than remedying health state, unspecified: Secondary | ICD-10-CM | POA: Diagnosis not present

## 2024-05-20 DIAGNOSIS — E785 Hyperlipidemia, unspecified: Secondary | ICD-10-CM | POA: Diagnosis not present

## 2024-05-20 DIAGNOSIS — E569 Vitamin deficiency, unspecified: Secondary | ICD-10-CM | POA: Diagnosis not present

## 2024-05-20 DIAGNOSIS — Z87898 Personal history of other specified conditions: Secondary | ICD-10-CM | POA: Diagnosis not present

## 2024-05-20 DIAGNOSIS — I10 Essential (primary) hypertension: Secondary | ICD-10-CM | POA: Diagnosis not present

## 2024-05-20 DIAGNOSIS — K5900 Constipation, unspecified: Secondary | ICD-10-CM | POA: Diagnosis not present

## 2024-06-12 DIAGNOSIS — Z419 Encounter for procedure for purposes other than remedying health state, unspecified: Secondary | ICD-10-CM | POA: Diagnosis not present

## 2024-07-13 DIAGNOSIS — Z419 Encounter for procedure for purposes other than remedying health state, unspecified: Secondary | ICD-10-CM | POA: Diagnosis not present

## 2024-08-02 DIAGNOSIS — M545 Low back pain, unspecified: Secondary | ICD-10-CM | POA: Diagnosis not present

## 2024-08-13 DIAGNOSIS — Z419 Encounter for procedure for purposes other than remedying health state, unspecified: Secondary | ICD-10-CM | POA: Diagnosis not present

## 2024-09-30 DIAGNOSIS — K047 Periapical abscess without sinus: Secondary | ICD-10-CM | POA: Diagnosis not present
